# Patient Record
Sex: Female | Born: 1955
Health system: Southern US, Community
[De-identification: ages and names within clinical notes are randomized; demographics above are authoritative.]

## PROBLEM LIST (undated history)

## (undated) DIAGNOSIS — F329 Major depressive disorder, single episode, unspecified: Secondary | ICD-10-CM

## (undated) DIAGNOSIS — F339 Major depressive disorder, recurrent, unspecified: Secondary | ICD-10-CM

## (undated) DIAGNOSIS — E785 Hyperlipidemia, unspecified: Secondary | ICD-10-CM

## (undated) DIAGNOSIS — M545 Low back pain, unspecified: Secondary | ICD-10-CM

## (undated) DIAGNOSIS — K2970 Gastritis, unspecified, without bleeding: Secondary | ICD-10-CM

## (undated) DIAGNOSIS — S82842A Displaced bimalleolar fracture of left lower leg, initial encounter for closed fracture: Secondary | ICD-10-CM

## (undated) DIAGNOSIS — M542 Cervicalgia: Secondary | ICD-10-CM

## (undated) DIAGNOSIS — B9681 Helicobacter pylori [H. pylori] as the cause of diseases classified elsewhere: Secondary | ICD-10-CM

## (undated) DIAGNOSIS — G43909 Migraine, unspecified, not intractable, without status migrainosus: Secondary | ICD-10-CM

## (undated) DIAGNOSIS — G44009 Cluster headache syndrome, unspecified, not intractable: Secondary | ICD-10-CM

## (undated) DIAGNOSIS — I6521 Occlusion and stenosis of right carotid artery: Secondary | ICD-10-CM

## (undated) DIAGNOSIS — R413 Other amnesia: Secondary | ICD-10-CM

## (undated) DIAGNOSIS — M797 Fibromyalgia: Secondary | ICD-10-CM

## (undated) DIAGNOSIS — R29898 Other symptoms and signs involving the musculoskeletal system: Secondary | ICD-10-CM

## (undated) DIAGNOSIS — M810 Age-related osteoporosis without current pathological fracture: Secondary | ICD-10-CM

## (undated) DIAGNOSIS — F32A Depression, unspecified: Secondary | ICD-10-CM

## (undated) DIAGNOSIS — F419 Anxiety disorder, unspecified: Secondary | ICD-10-CM

## (undated) HISTORY — PX: HAND SURGERY: SHX662

## (undated) HISTORY — DX: Major depressive disorder, recurrent, unspecified: F33.9

## (undated) HISTORY — DX: Low back pain: M54.5

## (undated) HISTORY — DX: Other symptoms and signs involving the musculoskeletal system: R29.898

## (undated) HISTORY — DX: Fibromyalgia: M79.7

## (undated) HISTORY — DX: Gastritis, unspecified, without bleeding: K29.70

## (undated) HISTORY — DX: Cluster headache syndrome, unspecified, not intractable: G44.009

## (undated) HISTORY — PX: LEG SURGERY: SHX1003

## (undated) HISTORY — PX: SHOULDER SURGERY: SHX246

## (undated) HISTORY — DX: Depression, unspecified: F32.A

## (undated) HISTORY — PX: NECK SURGERY: SHX720

## (undated) HISTORY — DX: Migraine, unspecified, not intractable, without status migrainosus: G43.909

## (undated) HISTORY — PX: BACK SURGERY: SHX140

## (undated) HISTORY — DX: Cervicalgia: M54.2

## (undated) HISTORY — DX: Low back pain, unspecified: M54.50

## (undated) HISTORY — DX: Hyperlipidemia, unspecified: E78.5

## (undated) HISTORY — DX: Age-related osteoporosis without current pathological fracture: M81.0

## (undated) HISTORY — PX: ABDOMINAL HYSTERECTOMY: SHX81

## (undated) HISTORY — DX: Other amnesia: R41.3

## (undated) HISTORY — DX: Anxiety disorder, unspecified: F41.9

## (undated) HISTORY — DX: Displaced bimalleolar fracture of left lower leg, initial encounter for closed fracture: S82.842A

## (undated) HISTORY — DX: Occlusion and stenosis of right carotid artery: I65.21

## (undated) HISTORY — DX: Major depressive disorder, single episode, unspecified: F32.9

## (undated) HISTORY — DX: Helicobacter pylori (H. pylori) as the cause of diseases classified elsewhere: B96.81

---

## 1998-05-30 ENCOUNTER — Ambulatory Visit (HOSPITAL_BASED_OUTPATIENT_CLINIC_OR_DEPARTMENT_OTHER): Admission: RE | Admit: 1998-05-30 | Discharge: 1998-05-30 | Payer: Self-pay | Admitting: Orthopedic Surgery

## 2000-06-26 ENCOUNTER — Encounter: Payer: Self-pay | Admitting: Emergency Medicine

## 2000-06-26 ENCOUNTER — Emergency Department (HOSPITAL_COMMUNITY): Admission: EM | Admit: 2000-06-26 | Discharge: 2000-06-26 | Payer: Self-pay | Admitting: Emergency Medicine

## 2001-10-17 ENCOUNTER — Emergency Department (HOSPITAL_COMMUNITY): Admission: EM | Admit: 2001-10-17 | Discharge: 2001-10-17 | Payer: Self-pay | Admitting: Emergency Medicine

## 2001-11-16 ENCOUNTER — Ambulatory Visit (HOSPITAL_COMMUNITY): Admission: RE | Admit: 2001-11-16 | Discharge: 2001-11-16 | Payer: Self-pay | Admitting: Internal Medicine

## 2001-11-25 ENCOUNTER — Observation Stay (HOSPITAL_COMMUNITY): Admission: RE | Admit: 2001-11-25 | Discharge: 2001-11-26 | Payer: Self-pay | Admitting: Neurosurgery

## 2002-11-23 ENCOUNTER — Ambulatory Visit (HOSPITAL_BASED_OUTPATIENT_CLINIC_OR_DEPARTMENT_OTHER): Admission: RE | Admit: 2002-11-23 | Discharge: 2002-11-23 | Payer: Self-pay | Admitting: Specialist

## 2003-04-01 ENCOUNTER — Encounter: Payer: Self-pay | Admitting: Emergency Medicine

## 2003-04-01 ENCOUNTER — Emergency Department (HOSPITAL_COMMUNITY): Admission: EM | Admit: 2003-04-01 | Discharge: 2003-04-01 | Payer: Self-pay | Admitting: Emergency Medicine

## 2003-05-05 ENCOUNTER — Encounter: Admission: RE | Admit: 2003-05-05 | Discharge: 2003-05-05 | Payer: Self-pay | Admitting: Internal Medicine

## 2003-05-05 ENCOUNTER — Encounter: Payer: Self-pay | Admitting: Internal Medicine

## 2003-06-20 ENCOUNTER — Other Ambulatory Visit: Admission: RE | Admit: 2003-06-20 | Discharge: 2003-06-20 | Payer: Self-pay | Admitting: Obstetrics & Gynecology

## 2003-07-21 ENCOUNTER — Ambulatory Visit (HOSPITAL_BASED_OUTPATIENT_CLINIC_OR_DEPARTMENT_OTHER): Admission: RE | Admit: 2003-07-21 | Discharge: 2003-07-21 | Payer: Self-pay | Admitting: Orthopedic Surgery

## 2003-09-02 ENCOUNTER — Emergency Department (HOSPITAL_COMMUNITY): Admission: AD | Admit: 2003-09-02 | Discharge: 2003-09-02 | Payer: Self-pay | Admitting: Internal Medicine

## 2003-09-14 ENCOUNTER — Emergency Department (HOSPITAL_COMMUNITY): Admission: AD | Admit: 2003-09-14 | Discharge: 2003-09-14 | Payer: Self-pay | Admitting: Family Medicine

## 2003-09-16 ENCOUNTER — Emergency Department (HOSPITAL_COMMUNITY): Admission: EM | Admit: 2003-09-16 | Discharge: 2003-09-16 | Payer: Self-pay | Admitting: Internal Medicine

## 2004-02-10 ENCOUNTER — Emergency Department (HOSPITAL_COMMUNITY): Admission: EM | Admit: 2004-02-10 | Discharge: 2004-02-10 | Payer: Self-pay | Admitting: Family Medicine

## 2004-02-11 ENCOUNTER — Emergency Department (HOSPITAL_COMMUNITY): Admission: EM | Admit: 2004-02-11 | Discharge: 2004-02-11 | Payer: Self-pay | Admitting: Family Medicine

## 2004-02-25 ENCOUNTER — Emergency Department (HOSPITAL_COMMUNITY): Admission: EM | Admit: 2004-02-25 | Discharge: 2004-02-25 | Payer: Self-pay | Admitting: Family Medicine

## 2004-03-03 ENCOUNTER — Emergency Department (HOSPITAL_COMMUNITY): Admission: EM | Admit: 2004-03-03 | Discharge: 2004-03-03 | Payer: Self-pay | Admitting: *Deleted

## 2004-04-07 ENCOUNTER — Emergency Department (HOSPITAL_COMMUNITY): Admission: EM | Admit: 2004-04-07 | Discharge: 2004-04-07 | Payer: Self-pay | Admitting: Emergency Medicine

## 2004-06-07 ENCOUNTER — Emergency Department (HOSPITAL_COMMUNITY): Admission: EM | Admit: 2004-06-07 | Discharge: 2004-06-07 | Payer: Self-pay | Admitting: Family Medicine

## 2004-06-10 ENCOUNTER — Emergency Department (HOSPITAL_COMMUNITY): Admission: EM | Admit: 2004-06-10 | Discharge: 2004-06-10 | Payer: Self-pay | Admitting: Family Medicine

## 2004-06-11 ENCOUNTER — Emergency Department (HOSPITAL_COMMUNITY): Admission: EM | Admit: 2004-06-11 | Discharge: 2004-06-11 | Payer: Self-pay | Admitting: Family Medicine

## 2004-06-12 ENCOUNTER — Ambulatory Visit: Payer: Self-pay | Admitting: Internal Medicine

## 2004-06-25 ENCOUNTER — Emergency Department (HOSPITAL_COMMUNITY): Admission: EM | Admit: 2004-06-25 | Discharge: 2004-06-25 | Payer: Self-pay | Admitting: Emergency Medicine

## 2004-07-11 ENCOUNTER — Ambulatory Visit: Payer: Self-pay | Admitting: Internal Medicine

## 2004-07-14 ENCOUNTER — Emergency Department (HOSPITAL_COMMUNITY): Admission: EM | Admit: 2004-07-14 | Discharge: 2004-07-14 | Payer: Self-pay | Admitting: Emergency Medicine

## 2004-07-28 ENCOUNTER — Emergency Department (HOSPITAL_COMMUNITY): Admission: EM | Admit: 2004-07-28 | Discharge: 2004-07-28 | Payer: Self-pay | Admitting: Emergency Medicine

## 2004-07-30 ENCOUNTER — Emergency Department (HOSPITAL_COMMUNITY): Admission: EM | Admit: 2004-07-30 | Discharge: 2004-07-30 | Payer: Self-pay | Admitting: Emergency Medicine

## 2004-08-08 ENCOUNTER — Emergency Department (HOSPITAL_COMMUNITY): Admission: EM | Admit: 2004-08-08 | Discharge: 2004-08-08 | Payer: Self-pay | Admitting: Emergency Medicine

## 2004-08-12 ENCOUNTER — Emergency Department (HOSPITAL_COMMUNITY): Admission: EM | Admit: 2004-08-12 | Discharge: 2004-08-12 | Payer: Self-pay | Admitting: Emergency Medicine

## 2004-08-14 ENCOUNTER — Ambulatory Visit: Payer: Self-pay | Admitting: Internal Medicine

## 2004-08-16 ENCOUNTER — Emergency Department (HOSPITAL_COMMUNITY): Admission: EM | Admit: 2004-08-16 | Discharge: 2004-08-16 | Payer: Self-pay | Admitting: Family Medicine

## 2004-08-23 ENCOUNTER — Emergency Department (HOSPITAL_COMMUNITY): Admission: EM | Admit: 2004-08-23 | Discharge: 2004-08-23 | Payer: Self-pay | Admitting: Emergency Medicine

## 2004-08-27 ENCOUNTER — Emergency Department (HOSPITAL_COMMUNITY): Admission: EM | Admit: 2004-08-27 | Discharge: 2004-08-27 | Payer: Self-pay | Admitting: Emergency Medicine

## 2004-09-17 ENCOUNTER — Emergency Department (HOSPITAL_COMMUNITY): Admission: EM | Admit: 2004-09-17 | Discharge: 2004-09-17 | Payer: Self-pay | Admitting: Emergency Medicine

## 2004-09-25 ENCOUNTER — Ambulatory Visit (HOSPITAL_BASED_OUTPATIENT_CLINIC_OR_DEPARTMENT_OTHER): Admission: RE | Admit: 2004-09-25 | Discharge: 2004-09-25 | Payer: Self-pay | Admitting: Orthopedic Surgery

## 2004-10-21 ENCOUNTER — Emergency Department (HOSPITAL_COMMUNITY): Admission: EM | Admit: 2004-10-21 | Discharge: 2004-10-21 | Payer: Self-pay | Admitting: Emergency Medicine

## 2004-10-25 ENCOUNTER — Emergency Department (HOSPITAL_COMMUNITY): Admission: EM | Admit: 2004-10-25 | Discharge: 2004-10-25 | Payer: Self-pay | Admitting: Emergency Medicine

## 2004-10-30 ENCOUNTER — Ambulatory Visit: Payer: Self-pay | Admitting: Internal Medicine

## 2004-11-04 ENCOUNTER — Emergency Department (HOSPITAL_COMMUNITY): Admission: EM | Admit: 2004-11-04 | Discharge: 2004-11-04 | Payer: Self-pay | Admitting: Emergency Medicine

## 2004-11-07 ENCOUNTER — Ambulatory Visit: Payer: Self-pay | Admitting: Internal Medicine

## 2004-11-08 ENCOUNTER — Emergency Department (HOSPITAL_COMMUNITY): Admission: EM | Admit: 2004-11-08 | Discharge: 2004-11-08 | Payer: Self-pay | Admitting: Emergency Medicine

## 2004-11-13 ENCOUNTER — Emergency Department (HOSPITAL_COMMUNITY): Admission: EM | Admit: 2004-11-13 | Discharge: 2004-11-13 | Payer: Self-pay | Admitting: Emergency Medicine

## 2004-11-16 ENCOUNTER — Emergency Department (HOSPITAL_COMMUNITY): Admission: EM | Admit: 2004-11-16 | Discharge: 2004-11-16 | Payer: Self-pay | Admitting: Emergency Medicine

## 2004-11-18 ENCOUNTER — Ambulatory Visit: Payer: Self-pay | Admitting: Internal Medicine

## 2004-11-19 ENCOUNTER — Emergency Department (HOSPITAL_COMMUNITY): Admission: EM | Admit: 2004-11-19 | Discharge: 2004-11-19 | Payer: Self-pay | Admitting: Emergency Medicine

## 2004-11-22 ENCOUNTER — Emergency Department (HOSPITAL_COMMUNITY): Admission: EM | Admit: 2004-11-22 | Discharge: 2004-11-23 | Payer: Self-pay | Admitting: Emergency Medicine

## 2004-11-26 ENCOUNTER — Ambulatory Visit (HOSPITAL_COMMUNITY): Admission: RE | Admit: 2004-11-26 | Discharge: 2004-11-26 | Payer: Self-pay | Admitting: Neurology

## 2004-11-29 ENCOUNTER — Emergency Department (HOSPITAL_COMMUNITY): Admission: EM | Admit: 2004-11-29 | Discharge: 2004-11-30 | Payer: Self-pay | Admitting: Emergency Medicine

## 2004-12-02 ENCOUNTER — Emergency Department (HOSPITAL_COMMUNITY): Admission: EM | Admit: 2004-12-02 | Discharge: 2004-12-02 | Payer: Self-pay | Admitting: Emergency Medicine

## 2004-12-03 ENCOUNTER — Emergency Department (HOSPITAL_COMMUNITY): Admission: EM | Admit: 2004-12-03 | Discharge: 2004-12-03 | Payer: Self-pay | Admitting: Emergency Medicine

## 2004-12-08 ENCOUNTER — Emergency Department (HOSPITAL_COMMUNITY): Admission: EM | Admit: 2004-12-08 | Discharge: 2004-12-08 | Payer: Self-pay | Admitting: Emergency Medicine

## 2004-12-10 ENCOUNTER — Emergency Department (HOSPITAL_COMMUNITY): Admission: EM | Admit: 2004-12-10 | Discharge: 2004-12-10 | Payer: Self-pay

## 2004-12-12 ENCOUNTER — Emergency Department (HOSPITAL_COMMUNITY): Admission: EM | Admit: 2004-12-12 | Discharge: 2004-12-12 | Payer: Self-pay | Admitting: Emergency Medicine

## 2004-12-13 ENCOUNTER — Ambulatory Visit: Payer: Self-pay | Admitting: Internal Medicine

## 2004-12-18 ENCOUNTER — Ambulatory Visit: Payer: Self-pay | Admitting: Internal Medicine

## 2004-12-20 ENCOUNTER — Ambulatory Visit: Payer: Self-pay | Admitting: Internal Medicine

## 2004-12-31 ENCOUNTER — Ambulatory Visit: Payer: Self-pay | Admitting: Internal Medicine

## 2005-01-15 ENCOUNTER — Ambulatory Visit: Payer: Self-pay | Admitting: Internal Medicine

## 2005-01-20 ENCOUNTER — Ambulatory Visit: Payer: Self-pay | Admitting: Internal Medicine

## 2005-01-20 ENCOUNTER — Emergency Department (HOSPITAL_COMMUNITY): Admission: EM | Admit: 2005-01-20 | Discharge: 2005-01-20 | Payer: Self-pay | Admitting: Emergency Medicine

## 2005-01-29 ENCOUNTER — Ambulatory Visit (HOSPITAL_COMMUNITY): Admission: RE | Admit: 2005-01-29 | Discharge: 2005-01-29 | Payer: Self-pay | Admitting: Specialist

## 2005-02-07 ENCOUNTER — Ambulatory Visit: Payer: Self-pay | Admitting: Internal Medicine

## 2005-02-14 ENCOUNTER — Ambulatory Visit: Payer: Self-pay | Admitting: Adult Health

## 2005-03-18 ENCOUNTER — Ambulatory Visit (HOSPITAL_BASED_OUTPATIENT_CLINIC_OR_DEPARTMENT_OTHER): Admission: RE | Admit: 2005-03-18 | Discharge: 2005-03-18 | Payer: Self-pay | Admitting: Specialist

## 2005-03-23 ENCOUNTER — Ambulatory Visit: Payer: Self-pay | Admitting: Internal Medicine

## 2005-04-02 ENCOUNTER — Ambulatory Visit: Payer: Self-pay | Admitting: Internal Medicine

## 2005-04-09 ENCOUNTER — Encounter: Admission: RE | Admit: 2005-04-09 | Discharge: 2005-04-09 | Payer: Self-pay | Admitting: Otolaryngology

## 2005-04-10 ENCOUNTER — Ambulatory Visit (HOSPITAL_COMMUNITY): Admission: RE | Admit: 2005-04-10 | Discharge: 2005-04-10 | Payer: Self-pay | Admitting: Internal Medicine

## 2005-04-16 ENCOUNTER — Ambulatory Visit: Payer: Self-pay | Admitting: Internal Medicine

## 2005-05-20 ENCOUNTER — Ambulatory Visit: Payer: Self-pay | Admitting: Internal Medicine

## 2005-05-30 ENCOUNTER — Ambulatory Visit: Payer: Self-pay | Admitting: Internal Medicine

## 2005-06-10 ENCOUNTER — Ambulatory Visit: Payer: Self-pay | Admitting: Internal Medicine

## 2005-06-11 ENCOUNTER — Ambulatory Visit: Payer: Self-pay | Admitting: Internal Medicine

## 2005-07-01 ENCOUNTER — Ambulatory Visit: Payer: Self-pay | Admitting: Internal Medicine

## 2005-07-31 ENCOUNTER — Ambulatory Visit: Payer: Self-pay | Admitting: Internal Medicine

## 2005-08-18 ENCOUNTER — Ambulatory Visit: Payer: Self-pay | Admitting: Internal Medicine

## 2005-08-25 ENCOUNTER — Ambulatory Visit: Payer: Self-pay | Admitting: Internal Medicine

## 2005-09-18 ENCOUNTER — Ambulatory Visit: Payer: Self-pay | Admitting: Pulmonary Disease

## 2005-11-04 ENCOUNTER — Ambulatory Visit: Payer: Self-pay | Admitting: Internal Medicine

## 2005-11-11 ENCOUNTER — Ambulatory Visit: Payer: Self-pay | Admitting: Internal Medicine

## 2005-12-09 ENCOUNTER — Ambulatory Visit: Payer: Self-pay | Admitting: Internal Medicine

## 2005-12-30 ENCOUNTER — Ambulatory Visit: Payer: Self-pay | Admitting: Internal Medicine

## 2006-01-04 ENCOUNTER — Emergency Department (HOSPITAL_COMMUNITY): Admission: EM | Admit: 2006-01-04 | Discharge: 2006-01-04 | Payer: Self-pay | Admitting: Emergency Medicine

## 2006-01-08 ENCOUNTER — Ambulatory Visit (HOSPITAL_COMMUNITY): Admission: RE | Admit: 2006-01-08 | Discharge: 2006-01-08 | Payer: Self-pay | Admitting: Orthopedic Surgery

## 2006-01-08 ENCOUNTER — Ambulatory Visit: Payer: Self-pay | Admitting: Internal Medicine

## 2006-01-15 ENCOUNTER — Ambulatory Visit (HOSPITAL_COMMUNITY): Admission: RE | Admit: 2006-01-15 | Discharge: 2006-01-15 | Payer: Self-pay | Admitting: Internal Medicine

## 2006-01-27 ENCOUNTER — Ambulatory Visit: Payer: Self-pay | Admitting: Internal Medicine

## 2006-02-24 ENCOUNTER — Ambulatory Visit: Payer: Self-pay | Admitting: Internal Medicine

## 2006-03-30 ENCOUNTER — Ambulatory Visit: Payer: Self-pay | Admitting: Internal Medicine

## 2006-04-20 ENCOUNTER — Ambulatory Visit: Payer: Self-pay | Admitting: Internal Medicine

## 2006-05-07 ENCOUNTER — Ambulatory Visit: Payer: Self-pay | Admitting: Internal Medicine

## 2006-05-18 ENCOUNTER — Ambulatory Visit: Payer: Self-pay | Admitting: Internal Medicine

## 2007-01-05 DIAGNOSIS — Z87898 Personal history of other specified conditions: Secondary | ICD-10-CM

## 2007-06-29 ENCOUNTER — Ambulatory Visit: Payer: Self-pay | Admitting: Family Medicine

## 2007-06-29 ENCOUNTER — Encounter (INDEPENDENT_AMBULATORY_CARE_PROVIDER_SITE_OTHER): Payer: Self-pay | Admitting: Nurse Practitioner

## 2007-06-29 LAB — CONVERTED CEMR LAB
BUN: 14 mg/dL (ref 6–23)
Basophils Absolute: 0 10*3/uL (ref 0.0–0.1)
CO2: 24 meq/L (ref 19–32)
Calcium: 9.7 mg/dL (ref 8.4–10.5)
Chloride: 103 meq/L (ref 96–112)
Creatinine, Ser: 0.96 mg/dL (ref 0.40–1.20)
Eosinophils Relative: 1 % (ref 0–5)
HCT: 42.8 % (ref 36.0–46.0)
Hemoglobin: 14.8 g/dL (ref 12.0–15.0)
Lymphocytes Relative: 30 % (ref 12–46)
MCHC: 34.6 g/dL (ref 30.0–36.0)
Monocytes Absolute: 0.6 10*3/uL (ref 0.1–1.0)
Monocytes Relative: 7 % (ref 3–12)
Neutro Abs: 5.1 10*3/uL (ref 1.7–7.7)
RBC: 4.77 M/uL (ref 3.87–5.11)
RDW: 13 % (ref 11.5–15.5)
TSH: 0.851 microintl units/mL (ref 0.350–5.50)
Total Bilirubin: 0.5 mg/dL (ref 0.3–1.2)

## 2007-06-30 ENCOUNTER — Encounter (INDEPENDENT_AMBULATORY_CARE_PROVIDER_SITE_OTHER): Payer: Self-pay | Admitting: Nurse Practitioner

## 2007-06-30 LAB — CONVERTED CEMR LAB: Hgb A1c MFr Bld: 5.6 % (ref 4.6–6.1)

## 2007-07-20 ENCOUNTER — Encounter (INDEPENDENT_AMBULATORY_CARE_PROVIDER_SITE_OTHER): Payer: Self-pay | Admitting: Nurse Practitioner

## 2007-07-20 ENCOUNTER — Ambulatory Visit: Payer: Self-pay | Admitting: Family Medicine

## 2007-07-20 LAB — CONVERTED CEMR LAB
Cholesterol: 177 mg/dL (ref 0–200)
HDL: 48 mg/dL (ref >39)
LDL Cholesterol: 93 mg/dL (ref 0–99)
Triglycerides: 181 mg/dL — ABNORMAL HIGH (ref <150)

## 2007-11-15 ENCOUNTER — Encounter: Payer: Self-pay | Admitting: Internal Medicine

## 2007-12-08 ENCOUNTER — Encounter
Admission: RE | Admit: 2007-12-08 | Discharge: 2008-03-07 | Payer: Self-pay | Admitting: Physical Medicine and Rehabilitation

## 2008-02-10 ENCOUNTER — Ambulatory Visit: Payer: Self-pay | Admitting: Family Medicine

## 2008-03-31 ENCOUNTER — Encounter: Payer: Self-pay | Admitting: Internal Medicine

## 2008-04-14 ENCOUNTER — Encounter: Payer: Self-pay | Admitting: Internal Medicine

## 2008-08-08 ENCOUNTER — Encounter: Admission: RE | Admit: 2008-08-08 | Discharge: 2008-08-08 | Payer: Self-pay | Admitting: Specialist

## 2008-08-14 ENCOUNTER — Ambulatory Visit (HOSPITAL_BASED_OUTPATIENT_CLINIC_OR_DEPARTMENT_OTHER): Admission: RE | Admit: 2008-08-14 | Discharge: 2008-08-14 | Payer: Self-pay | Admitting: Specialist

## 2009-08-06 ENCOUNTER — Encounter: Payer: Self-pay | Admitting: Internal Medicine

## 2009-09-21 ENCOUNTER — Encounter: Payer: Self-pay | Admitting: Internal Medicine

## 2009-12-18 ENCOUNTER — Ambulatory Visit (HOSPITAL_BASED_OUTPATIENT_CLINIC_OR_DEPARTMENT_OTHER)
Admission: RE | Admit: 2009-12-18 | Discharge: 2009-12-18 | Payer: Self-pay | Admitting: Physical Medicine and Rehabilitation

## 2010-02-18 ENCOUNTER — Encounter: Payer: Self-pay | Admitting: Internal Medicine

## 2010-02-26 ENCOUNTER — Encounter: Payer: Self-pay | Admitting: Internal Medicine

## 2010-03-25 ENCOUNTER — Encounter: Payer: Self-pay | Admitting: Internal Medicine

## 2010-04-22 ENCOUNTER — Ambulatory Visit: Payer: Self-pay | Admitting: Internal Medicine

## 2010-04-22 DIAGNOSIS — K625 Hemorrhage of anus and rectum: Secondary | ICD-10-CM | POA: Insufficient documentation

## 2010-04-22 DIAGNOSIS — R1031 Right lower quadrant pain: Secondary | ICD-10-CM | POA: Insufficient documentation

## 2010-04-22 DIAGNOSIS — R198 Other specified symptoms and signs involving the digestive system and abdomen: Secondary | ICD-10-CM | POA: Insufficient documentation

## 2010-04-22 DIAGNOSIS — R112 Nausea with vomiting, unspecified: Secondary | ICD-10-CM | POA: Insufficient documentation

## 2010-04-22 LAB — CONVERTED CEMR LAB
ALT: 15 units/L (ref 0–35)
Alkaline Phosphatase: 49 units/L (ref 39–117)
Basophils Absolute: 0 10*3/uL (ref 0.0–0.1)
CO2: 34 meq/L — ABNORMAL HIGH (ref 19–32)
Creatinine, Ser: 1.1 mg/dL (ref 0.4–1.2)
GFR calc non Af Amer: 56.01 mL/min (ref >60)
HCT: 37.7 % (ref 36.0–46.0)
Hemoglobin: 12.9 g/dL (ref 12.0–15.0)
Lymphs Abs: 2.8 10*3/uL (ref 0.7–4.0)
MCHC: 34.3 g/dL (ref 30.0–36.0)
MCV: 93.2 fL (ref 78.0–100.0)
Monocytes Absolute: 0.5 10*3/uL (ref 0.1–1.0)
Monocytes Relative: 8.2 % (ref 3.0–12.0)
Neutro Abs: 2.9 10*3/uL (ref 1.4–7.7)
Platelets: 183 10*3/uL (ref 150.0–400.0)
RDW: 12.8 % (ref 11.5–14.6)
TSH: 0.9 microintl units/mL (ref 0.35–5.50)
Total Bilirubin: 0.4 mg/dL (ref 0.3–1.2)

## 2010-04-24 ENCOUNTER — Telehealth: Payer: Self-pay | Admitting: Internal Medicine

## 2010-05-02 ENCOUNTER — Ambulatory Visit: Payer: Self-pay | Admitting: Cardiology

## 2010-05-06 ENCOUNTER — Encounter (INDEPENDENT_AMBULATORY_CARE_PROVIDER_SITE_OTHER): Payer: Self-pay | Admitting: *Deleted

## 2010-05-08 ENCOUNTER — Ambulatory Visit: Payer: Self-pay | Admitting: Internal Medicine

## 2010-05-15 ENCOUNTER — Ambulatory Visit: Payer: Self-pay | Admitting: Internal Medicine

## 2010-07-23 ENCOUNTER — Telehealth: Payer: Self-pay | Admitting: Internal Medicine

## 2010-07-25 ENCOUNTER — Telehealth (INDEPENDENT_AMBULATORY_CARE_PROVIDER_SITE_OTHER): Payer: Self-pay

## 2010-07-25 DIAGNOSIS — J984 Other disorders of lung: Secondary | ICD-10-CM

## 2010-08-06 ENCOUNTER — Ambulatory Visit: Payer: Self-pay | Admitting: Cardiology

## 2010-08-06 ENCOUNTER — Telehealth: Payer: Self-pay | Admitting: Internal Medicine

## 2010-08-23 ENCOUNTER — Encounter
Admission: RE | Admit: 2010-08-23 | Discharge: 2010-08-23 | Payer: Self-pay | Source: Home / Self Care | Attending: Physical Medicine and Rehabilitation | Admitting: Physical Medicine and Rehabilitation

## 2010-09-01 ENCOUNTER — Encounter: Payer: Self-pay | Admitting: Internal Medicine

## 2010-09-01 ENCOUNTER — Encounter: Payer: Self-pay | Admitting: Family Medicine

## 2010-09-10 NOTE — Letter (Signed)
Summary: [facility]   Imported By: Lester Delavan 04/25/2010 07:49:42  _____________________________________________________________________  External Attachment:    Type:   Image     Comment:   External Document

## 2010-09-10 NOTE — Letter (Signed)
Summary: [facility]  [facility]   Imported By: Lester Lansford 04/25/2010 07:48:16  _____________________________________________________________________  External Attachment:    Type:   Image     Comment:   External Document

## 2010-09-10 NOTE — Miscellaneous (Signed)
Summary: [facility] Previsit/prep  ...  Clinical Lists Changes  Medications: Added new medication of MOVIPREP 100 GM  SOLR (PEG-KCL-NACL-NASULF-NA ASC-C) As per prep instructions. - Signed Rx of MOVIPREP 100 GM  SOLR (PEG-KCL-NACL-NASULF-NA ASC-C) As per prep instructions.;  #1 x 0;  Signed;  Entered by: Wyona Almas RN;  Authorized by: Iva Boop MD, [facility];  Method used: Electronically to [facility] [phone]*, [address] [place], [place]  [address], Ph: [phone], Fax: [phone] Allergies: Changed allergy or adverse reaction from PENICILLIN to PENICILLIN Observations: Added new observation of ALLERGY REV: Done (05/08/2010 8:28)    Prescriptions: MOVIPREP 100 GM  SOLR (PEG-KCL-NACL-NASULF-NA ASC-C) As per prep instructions.  #1 x 0   Entered by:   Wyona Almas RN   Authorized by:   Iva Boop MD, [facility]   Signed by:   Wyona Almas RN on 05/08/2010   Method used:   Electronically to        [facility] [phone]* (retail)       [address]       [place], [place]  [address]       Ph: [phone]       Fax: [phone]   RxID:   [id number]

## 2010-09-10 NOTE — Progress Notes (Signed)
Summary: CT Scheduling  Phone Note Outgoing Call   Call placed by: Francee Piccolo CMA Duncan Dull),  April 24, 2010 5:05 PM Summary of Call: Notified pt that we have not heard from [facility] regarding prior auth for CT Scan.  Pt is agreeable with plan to cancel tomorrow's appt and reschedule once we have heard from [facility].  Charity at CT Scheduling notified to cancel pt's appt for tomorrow.  Follow-up for Phone Call        Per [place], [employer] has given PA # ([phone]).  Pt is notified that we can scheudle.  Pt is rescheduled for 05/02/10 @ 10:30am.  Pt is aware.  Instructions reviewed with pt. Follow-up by: Francee Piccolo CMA Duncan Dull),  April 30, 2010 4:31 PM

## 2010-09-10 NOTE — Letter (Signed)
Summary: New Patient letter  [facility]  [address] [place], [place] [address]   Phone: [phone]  Fax: [phone]       02/26/2010 MRN: [id number]  Julie Neal [address] [place], [place]  [address]  Dear Ms. Schuitema,  Welcome to the Gastroenterology Division at [facility].    You are scheduled to see Dr.  Leone Payor on 04-22-10 at 1:45pm on the 3rd floor at [facility], [address]  We ask that you try to arrive at our office 15 minutes prior to your appointment time to allow for check-in.  We would like you to complete the enclosed self-administered evaluation form prior to your visit and bring it with you on the day of your appointment.  We will review it with you.  Also, please bring a complete list of all your medications or, if you prefer, bring the medication bottles and we will list them.  Please bring your insurance card so that we may make a copy of it.  If your insurance requires a referral to see a specialist, please bring your referral form from your primary care physician.  Co-payments are due at the time of your visit and may be paid by cash, check or credit card.     Your office visit will consist of a consult with your physician (includes a physical exam), any laboratory testing he/she may order, scheduling of any necessary diagnostic testing (e.g. x-ray, ultrasound, CT-scan), and scheduling of a procedure (e.g. Endoscopy, Colonoscopy) if required.  Please allow enough time on your schedule to allow for any/all of these possibilities.    If you cannot keep your appointment, please call [phone] to cancel or reschedule prior to your appointment date.  This allows [place] the opportunity to schedule an appointment for another patient in need of care.  If you do not cancel or reschedule by 5 p.m. the business day prior to your appointment date, you will be charged a $50.00 late cancellation/no-show fee.    Thank you for choosing   Gastroenterology for your medical needs.  We appreciate the opportunity to care for you.  Please visit [place] at our website  to learn more about our practice.                     Sincerely,                                                             The Gastroenterology Division

## 2010-09-10 NOTE — Letter (Signed)
Summary: [facility] Instructions  [facility]  [address] [place], [place] [address]   Phone: [phone]  Fax: [phone]       MERILEE WIBLE    [birth year]    MRN: [id number]        Procedure Day Dorna Bloom: Wednesday 05-15-10     Arrival Time: 9:00 a.m.     Procedure Time: 10:00 a.m.     Location of Procedure:                    _x _  [facility] (4th Floor)                        PREPARATION FOR COLONOSCOPY WITH MOVIPREP   Starting 5 days prior to your procedure  05-10-10 do not eat nuts, seeds, popcorn, corn, beans, peas,  salads, or any raw vegetables.  Do not take any fiber supplements (e.g. Metamucil, Citrucel, and Benefiber).  THE DAY BEFORE YOUR PROCEDURE         DATE:  05-14-10  DAY:  Tuesday  1.  Drink clear liquids the entire day-NO SOLID FOOD  2.  Do not drink anything colored red or purple.  Avoid juices with pulp.  No orange juice.  3.  Drink at least 64 oz. (8 glasses) of fluid/clear liquids during the day to prevent dehydration and help the prep work efficiently.  CLEAR LIQUIDS INCLUDE: Water Jello Ice Popsicles Tea (sugar ok, no milk/cream) Powdered fruit flavored drinks Coffee (sugar ok, no milk/cream) Gatorade Juice: apple, white grape, white cranberry  Lemonade Clear bullion, consomm, broth Carbonated beverages (any kind) Strained chicken noodle soup Hard Candy                             4.  In the morning, mix first dose of MoviPrep solution:    Empty 1 Pouch A and 1 Pouch B into the disposable container    Add lukewarm drinking water to the top line of the container. Mix to dissolve    Refrigerate (mixed solution should be used within 24 hrs)  5.  Begin drinking the prep at 5:00 p.m. The MoviPrep container is divided by 4 marks.   Every 15 minutes drink the solution down to the next mark (approximately 8 oz) until the full liter is complete.   6.  Follow completed prep with 16 oz of clear liquid of your choice  (Nothing red or purple).  Continue to drink clear liquids until bedtime.  7.  Before going to bed, mix second dose of MoviPrep solution:    Empty 1 Pouch A and 1 Pouch B into the disposable container    Add lukewarm drinking water to the top line of the container. Mix to dissolve    Refrigerate  THE DAY OF YOUR PROCEDURE      DATE:  05-15-10  DAY: Wednesday  Beginning at  5:00 a.m. (5 hours before procedure):         1. Every 15 minutes, drink the solution down to the next mark (approx 8 oz) until the full liter is complete.  2. Follow completed prep with 16 oz. of clear liquid of your choice.    3. You may drink clear liquids until  8:00 a.m. (2 HOURS BEFORE PROCEDURE).   MEDICATION INSTRUCTIONS  Unless otherwise instructed, you should take regular prescription medications with a small sip of water  as early as possible the morning of your procedure.        OTHER INSTRUCTIONS  You will need a responsible adult at least 56 years of age to accompany you and drive you home.   This person must remain in the waiting room during your procedure.  Wear loose fitting clothing that is easily removed.  Leave jewelry and other valuables at home.  However, you may wish to bring a book to read or  an iPod/MP3 player to listen to music as you wait for your procedure to start.  Remove all body piercing jewelry and leave at home.  Total time from sign-in until discharge is approximately 2-3 hours.  You should go home directly after your procedure and rest.  You can resume normal activities the  day after your procedure.  The day of your procedure you should not:   Drive   Make legal decisions   Operate machinery   Drink alcohol   Return to work  You will receive specific instructions about eating, activities and medications before you leave.    The above instructions have been reviewed and explained to me by   Wyona Almas RN  May 08, 2010 8:56 AM     I  fully understand and can verbalize these instructions _____________________________ Date _________

## 2010-09-10 NOTE — Procedures (Signed)
Summary: Colonoscopy  Patient: [name] Note: All result statuses are Final unless otherwise noted.  Tests: (1) Colonoscopy (COL)   COL Colonoscopy           DONE     [facility]     [address]     [place], [place]  [address]           COLONOSCOPY PROCEDURE REPORT           PATIENT:  Julie, Neal  MR#:  [id number]     BIRTHDATE:  06/24/1956, 53 yrs. old  GENDER:  female     ENDOSCOPIST:  Iva Boop, MD, [facility]     REF. BY:  Lynnea Ferrier, M.D.     PROCEDURE DATE:  05/15/2010     PROCEDURE:  Colonoscopy [address]     ASA CLASS:  Class I     INDICATIONS:  change in bowel habits, rectal bleeding     MEDICATIONS:   Fentanyl 75 mcg IV, Versed 10 mg IV           DESCRIPTION OF PROCEDURE:   After the risks benefits and     alternatives of the procedure were thoroughly explained, informed     consent was obtained.  Digital rectal exam was performed and     revealed no abnormalities.   The LB CF-[id number] endoscope     was introduced through the anus and advanced to the terminal ileum     which was intubated for a short distance, without limitations.     The quality of the prep was excellent, using MoviPrep.  The     instrument was then slowly withdrawn as the colon was fully     examined. Insertion: 3:35 minutes Withdrawal: 10:10 minutes     <<PROCEDUREIMAGES>>           FINDINGS:  The terminal ileum appeared normal.  A normal appearing     cecum, ileocecal valve, and appendiceal orifice were identified.     The ascending, hepatic flexure, transverse, splenic flexure,     descending, sigmoid colon, and rectum appeared unremarkable. This     includes right colon retroflexion.   Retroflexed views in the     rectum revealed no abnormalities.    The scope was then withdrawn     from the patient and the procedure completed.           COMPLICATIONS:  None     ENDOSCOPIC IMPRESSION:     1) Normal terminal ileum     2) Normal colon     RECOMMENDATIONS:     Take  Miralax daily (or less) but enough to control constipation.     May take 2 doses daily if that is what is required. If that fails     to rlieve pain then can add an antipsasmodic medication.     Follow-up with Dr. Leone Payor as needed for this problem.           REPEAT EXAM:  In 10 year(s) for routine screening colonoscopy.           Iva Boop, MD, [facility]           CC:  Lynnea Ferrier, MD     The Patient           n.     eSIGNED:   Iva Boop at 05/15/2010 11:39 AM           Corky Sing, [phone]  Note:  An exclamation mark (!) indicates a result that was not dispersed into the flowsheet. Document Creation Date: 05/15/2010 11:39 AM _______________________________________________________________________  (1) Order result status: Final Collection or observation date-time: 05/15/2010 11:28 Requested date-time:  Receipt date-time:  Reported date-time:  Referring Physician:   Ordering Physician: Stan Head [phone]) Specimen Source:  Source: Launa Grill Order Number: [id number] Lab site:   Appended Document: Colonoscopy    Clinical Lists Changes  Observations: Added new observation of COLONNXTDUE: 05/2020 (05/15/2010 12:02)

## 2010-09-10 NOTE — Assessment & Plan Note (Signed)
Summary: RECALL COLON-RECTAL BLEED/YF   History of Present Illness Visit Type: consult Primary GI MD: Stan Head MD [facility] Primary Provider: Gilmore Laroche, MD Requesting Provider: Gilmore Laroche, MD Chief Complaint: change in bowels and rectal bleeding History of Present Illness:   55 yo ww with chronic constipation. For past few months increasing constipation and RLQ pain. She had been using MiraLax with success but was told to use fiber instead, by a Careers adviser. She descrbes a sense that she is blocked in RLQ. She vomits sometimes and there can be severe pain with defecation (in RLQ). Also having rectal bleeding with bright red blood when she wipes frequently. Dr. Caren Macadam team found hemorrhoids on exam.   GI Review of Systems    Reports abdominal pain, bloating, nausea, and  vomiting.      Denies acid reflux, belching, chest pain, dysphagia with liquids, dysphagia with solids, heartburn, loss of appetite, vomiting blood, weight loss, and  weight gain.      Reports rectal bleeding.     Denies anal fissure, black tarry stools, change in bowel habit, constipation, diarrhea, diverticulosis, fecal incontinence, heme positive stool, hemorrhoids, irritable bowel syndrome, jaundice, light color stool, liver problems, and  rectal pain. Colonoscopy  Procedure date:  01/10/2003  Findings:      External hemorrhoids, otherwise normal.  Dr. Leone Payor   Preventive Screening-Counseling & Management  Alcohol-Tobacco     Smoking Status: quit      Drug Use:  no.      Current Medications (verified): 1)  Zanaflex 4 Mg Tabs (Tizanidine Hcl) .... Take 1 Tablet By Mouth Four Times A Day 2)  Diazepam 5 Mg Tabs (Diazepam) .... Take 1 Tablet By Mouth Two Times A Day 3)  Duragesic-75 75 Mcg/hr Pt72 (Fentanyl) .... Apply As Directed 4)  Tramadol Hcl 50 Mg Tabs (Tramadol Hcl) .... As Needed  Allergies (verified): 1)  ! Penicillin  Past History:  Past Medical History: Hemorrhoids Chronic  Headaches Anxiety Disorder Arthritis Depression Fibromyalgia Hyperlipidemia Small Bowel Obstruction  Past Surgical History: Carpal Tunnel Release Hysterectomy Tarsal Tunnel Plantar Fascitis Shoulder surgery Back Surgery Neck Surgery  Family History: No FH of Colon Cancer: Family History of Colon Polyps: Brother  Social History: Married, 1 boy, 1 girl Disabled Patient is a former smoker.  Alcohol Use - no Illicit Drug Use - no Smoking Status:  quit Drug Use:  no  Review of Systems       The patient complains of muscle pains/cramps.         All other ROS negative except as per HPI.   Vital Signs:  Patient profile:   55 year old female Height:      67 inches Weight:      181 pounds BMI:     28.45 Pulse rate:   76 / minute Pulse rhythm:   regular BP sitting:   120 / 74  (left arm) Cuff size:   regular  Vitals Entered By: Francee Piccolo CMA Duncan Dull) (April 22, 2010 1:51 PM)  Physical Exam  General:  Well developed, well nourished, no acute distress. Eyes:  PERRLA, no icterus. Mouth:  No deformity or lesions, dentition normal. Neck:  Supple; no masses or thyromegaly. Lungs:  Clear throughout to auscultation. Heart:  Regular rate and rhythm; no murmurs, rubs,  or bruits. Abdomen:  BS+ low transverse scar soft with fullness and tenderness in RLQ she vomited brown material after palpation of abdomen no bruits no herniae, HSM or mass Extremities:  No clubbing, cyanosis,  edema or deformities noted. Neurologic:  Alert and  oriented x3 Cervical Nodes:  No significant cervical or supraclavicular adenopathy.  Psych:  Alert and cooperative. Normal mood and affect.  [facility] notes and 2010/2011 labs reviewed and scanned  Impression & Recommendations:  Problem # 1:  CHANGE IN BOWELS (ICD-787.99) Given tenderness on exam would have her do CT first. Colonoscopy likely needed unless CT shows something requiring other intervention or  evaluation first. Orders: CT Abdomen/Pelvis with Contrast (CT Abd/Pelvis w/con) TLB-CBC Platelet - w/Differential (85025-CBCD) TLB-CMP (Comprehensive Metabolic Pnl) (80053-COMP) TLB-TSH (Thyroid Stimulating Hormone) (84443-TSH)  Problem # 2:  RLQ PAIN (ICD-789.03) CT first labs also then colonoscopy depending upon CT findings  Orders: CT Abdomen/Pelvis with Contrast (CT Abd/Pelvis w/con)  Problem # 3:  NAUSEA AND VOMITING (ICD-787.01) Assessment: New In general has not been frequent. palpation of abdomen created pain and then vomiting today. short-lived and she recovered from that in exam room. She has Phenergan as needed  Problem # 4:  RECTAL BLEEDING (ICD-569.3) Assessment: New probably hemorrhoidal await diagnostic evaluation as mentioned above.  Patient Instructions: 1)  Please go to the basement to have your lab tests drawn today.  2)  Your CT is scheduled at [facility] CT on 04/25/10. 3)  We will call you with further follow up after reviewing these results.  4)  Please start Miralax once daily. 5)  It is OK to take your phenergan as needed. 6)  Copy sent to : Gilmore Laroche, MD 7)  The medication list was reviewed and reconciled.  All changed / newly prescribed medications were explained.  A complete medication list was provided to the patient / caregiver.  Appended Document: RECALL COLON-RECTAL BLEED/YF vomits

## 2010-09-12 NOTE — Progress Notes (Signed)
Summary: CT chest ok   Phone Note Outgoing Call   Summary of Call: Let her know CT is clear and no follow-up needed and fax this and scan report to PCP Iva Boop MD, [facility]  August 06, 2010 10:26 AM   Follow-up for Phone Call        Patient aware,  records faxed to Dr Tanya Nones in [facility]. Follow-up by: Darcey Nora RN, CGRN,  August 06, 2010 10:40 AM

## 2010-09-12 NOTE — Progress Notes (Signed)
Summary: Schedule CT chest   Phone Note Outgoing Call Call back at [facility] Phone [phone]   Call placed by: Darcey Nora RN, CGRN,  July 25, 2010 11:39 AM Call placed to: Patient Summary of Call: Patient is scheduled for CT scan of chest for 08/06/10 10:00.  I will mail her instructions.  She needs to be NPO for 2 hours prior.  She is aware of appointment date and time Initial call taken by: Darcey Nora RN, CGRN,  July 25, 2010 11:41 AM  Follow-up for Phone Call        Does not need BUN and Creatnine due to age. Follow-up by: Darcey Nora RN, CGRN,  July 25, 2010 11:42 AM  New Problems: PULMONARY NODULE (ICD-518.89)   New Problems: PULMONARY NODULE (ICD-518.89)

## 2010-09-12 NOTE — Progress Notes (Signed)
Summary: needs chest CT   Phone Note Outgoing Call   Summary of Call: needs BUN and creat and chest CT with contrast to evaluate and follow-up lung nodule seen on abd CT in sept (see chart). Please call her and schedule this for after Christmas (when I return). Iva Boop MD, [facility]  July 23, 2010 9:09 PM    Follow-up for Phone Call        I spoke to pt and she is unable to schedule appt at this time due to back injury.  She is not feeling well enough to remember instructions or dates. Pt requests that we call her back next week to schedule. Follow-up by: Francee Piccolo CMA Duncan Dull),  July 24, 2010 10:33 AM  Additional Follow-up for Phone Call Additional follow up Details #1::        ok, I will forward on, then Additional Follow-up by: Iva Boop MD, [facility],  July 24, 2010 1:02 PM

## 2010-11-25 ENCOUNTER — Ambulatory Visit (HOSPITAL_BASED_OUTPATIENT_CLINIC_OR_DEPARTMENT_OTHER)
Admission: RE | Admit: 2010-11-25 | Discharge: 2010-11-25 | Disposition: A | Discharge status: Home / Self Care | Payer: 59 | Source: Ambulatory Visit | Attending: Physical Medicine and Rehabilitation | Admitting: Physical Medicine and Rehabilitation

## 2010-11-25 DIAGNOSIS — Z538 Procedure and treatment not carried out for other reasons: Secondary | ICD-10-CM | POA: Insufficient documentation

## 2010-11-25 DIAGNOSIS — M961 Postlaminectomy syndrome, not elsewhere classified: Secondary | ICD-10-CM | POA: Insufficient documentation

## 2010-11-25 LAB — GLUCOSE, CAPILLARY: Glucose-Capillary: 89 mg/dL (ref 70–99)

## 2010-12-23 ENCOUNTER — Ambulatory Visit (HOSPITAL_BASED_OUTPATIENT_CLINIC_OR_DEPARTMENT_OTHER)
Admission: RE | Admit: 2010-12-23 | Payer: 59 | Source: Ambulatory Visit | Admitting: Physical Medicine and Rehabilitation

## 2010-12-24 NOTE — Op Note (Signed)
NAME:  Julie Neal, Julie Neal                ACCOUNT NO.:  [id number]   MEDICAL RECORD NO.:  [id number]          PATIENT TYPE:  AMB   LOCATION:  [facility]                         FACILITY:  [facility]   PHYSICIAN:  Jene Every, M.D.    DATE OF BIRTH:  1956/03/24   DATE OF PROCEDURE:  DATE OF DISCHARGE:                               OPERATIVE REPORT   PREOPERATIVE DIAGNOSIS:  Impingement syndrome, acromioclavicular  arthrosis, adhesive capsulitis.   POSTOPERATIVE DIAGNOSIS:  Impingement syndrome, acromioclavicular  arthrosis, adhesive capsulitis.   PROCEDURE PERFORMED:  1. Left shoulder examination under anesthesia, manipulation under      anesthesia.  2. Left shoulder arthroscopy and debridement of subacromial space.  3. Open distal clavicle resection.  4. Debridement of rotator cuff.   ANESTHESIA:  General.   ASSISTANT:  Roma Schanz, P.A.   BRIEF HISTORY/INDICATINOS:  This is a 55 year old with refractory  shoulder pain, history of a cervical fusion, arthroscopic decompression,  and distal clavicle resection.  She was having recurrent impingement,  pain, diminished range of motion, pain over the [facility] joint, indicated for  arthroscopic evaluation, possible rotator cuff repair with debridement  on the [facility] joint.  After she had failed conservative treatment, risks and  benefits were discussed, including bleeding, infection, suboptimal range  of motion, recurrent pain, infection, etc.   TECHNIQUE:  Patient in supine beach-chair position, after induction of  adequate anesthesia and 1 gm Kefzol, the left shoulder and upper  extremity were prepped and draped in the usual sterile fashion.  Prior  to that, we examined her under anesthesia.  She lacked about 10 degrees  of full forward flexion and abduction, which was restored with gentle  manipulation.  She has good internal and external rotation.  Following  this and prepping and draping of the left upper extremity, demarcating  the AC acromion  with a surgical marker, we made an incision through the  posterolateral skin for posterolateral portal in the arm in the 70-30  position.  Directed the camera in the glenohumeral space, penetrated the  capsule atraumatically.  Examination revealed essentially normal biceps  tendon, rotator cuff.  No significant degenerative changes.  Mild  fraying of the labrum.  I redirected in the subacromial space and made  an incision for a lateral portal, exuberant synovitis and bursitis was  noted.  We inserted a shaver and performed a bursectomy.  I was unable  to deliver the distal clavicle through the subacromial space.  Full  inspection of the rotator cuff anteriorly, laterally, and posteriorly  revealed no evidence of the significant tearing requiring open repair.   After a full bursectomy, examined the under-surface of the acromion.  There was no significant spurring requiring acromioplasty.  After  copious lavage, I removed the arthroscopic equipment.  I made a small  incision over a previous incision over the [facility] joint through the skin,  transverse through the capsule.  I debrided the capsule in the distal  clavicle joint with a Beyer rongeur.  A small spur of the inferior  aspect of the acromion that was removed with a 3-mm Kerrison.  Clavicle  was undercut with this anteriorly and inferiorly.  The joint was  debrided.  Following this, there was good space between the distal  clavicle and the acromion.  It was injected with Marcaine with  epinephrine.  The capsule with 2-0 Vicryl with simple sutures.  The  subcu with 2-0 Vicryl and simple sutures and the skin with 4-  0 subcuticular Prolene.  The portals are closed with 4-0 nylon simple  sutures.  Dressing was applied.  Placed in a sling.  Extubated without  difficulty.  Transferred to the recovery room in satisfactory condition.  Patient tolerated the procedure well with no complication.      Jene Every, M.D.  Electronically  Signed     JB/MEDQ  D:  08/14/2008  T:  08/14/2008  Job:  [id number]

## 2010-12-27 NOTE — Op Note (Signed)
NAME:  Julie Neal, Julie Neal                ACCOUNT NO.:  [id number]   MEDICAL RECORD NO.:  [id number]          PATIENT TYPE:  AMB   LOCATION:  DAY                          FACILITY:  [facility]   PHYSICIAN:  Jene Every, M.D.    DATE OF BIRTH:  1955-08-21   DATE OF PROCEDURE:  01/29/2005  DATE OF DISCHARGE:                                 OPERATIVE REPORT   PREOPERATIVE DIAGNOSIS:  Impingement syndrome, adhesive capsulitis of the  left shoulder.   POSTOPERATIVE DIAGNOSIS:  Impingement syndrome, adhesive capsulitis of the  left shoulder, acromioclavicular synovitis, partial tear of the rotator  cuff.   PROCEDURE PERFORMED:  Left shoulder examination under anesthesia, left  shoulder manipulation under anesthesia, left shoulder arthroscopy,  subacromial decompression, acromioplasty, debridement of rotator cuff and  debridement of acromioclavicular joint.   ANESTHESIA:  General.   ASSISTANT:  Roma Schanz, P.A.   INDICATIONS:  A 55 year old with a history of distal clavicle resection and  impingement syndrome.  Patient recently had an ankle fracture and was  nonweightbearing, utilizing crutches.  Loaded glenohumeral joint and  acromion.  The patient had a recurrent acromial spur, generating severe  subacromial bursitis with adhesive capsulitis, refractory to treatment,  steroid injection, rest, and stretch and exercise program.  Operative  intervention was indicated for evaluation of rotator cuff, subacromial  decompression, bursectomy, evaluation of the AC joint.  MRI indicating no  evidence of a recurrent tear.  The risks and benefits were discussed,  including bleeding, infection, no changes in symptoms, worsening symptoms,  recurring adhesive capsulitis, etc.   TECHNIQUE:  With the patient in the supine position, after satisfactory  general anesthesia and 1 gm of Kefzol, the left shoulder was examined.  The  patient actually had good internal and external rotation.  She lacks  some  forward flexion proximally, 15 degrees of gently stretching to full flexion.  She had good abduction following this.  Next, we used a marker to delineate  the acromion.  The left shoulder and upper extremities were prepped and  draped in the usual sterile fashion.  I placed a portal in the posterior  region in the usual fashion.  I injected with Marcaine and epinephrine in  the subacromial joint.  A lateral incision was made through a previous  surgical incision.  I first entered the scope, placed the scope in the  glenohumeral joint.  I penetrated atraumatically at the 70/30 position.  The  C-arm was placed.  Examination of the rotator cuff from underneath, there  was no evidence of full-thickness tear.  The glenoid showed some  degenerative changes.  No evidence of a tear or significant chondral injury.  The joint was lavaged and then redirected the scope into the subacromial  space.  A lateral incision was made.  I placed the portal into the  anterolateral portion of the acromion and examined.  Hypertrophic bursa was  noted.  This was incised with a shaver.  We used an ArthroWand to  skeletonize the anterior aspect of the acromion.  There was a spur along the  anteromedial aspect of the acromion  to the [facility] joint.  There was hypertrophic  synovitis and scar tissue within the [facility] joint, extending inferiorly,  impending upon the rotator cuff.  After dissection was performed, we  debrided the [facility] joint and then inserted a bur and performed an acromioplasty  and removed the anteromedial spur.  Evaluated the rotator cuff.  There was  superficial tearing of the rotator cuff.  This was debrided to good bleeding  tissue.  We inspected it anteriorly and posteriorly before the bursectomy  was performed.  No residual impingement upon the rotator cuff was noted.  The clavicle was stable.  They shaved the under-surface of the clavicle as  well.  There was a small spur there noted as well.  Next,  the wound was  copiously lavaged.  All instrumentation was removed.  The  portal was closed with 4-0 nylon simple sutures and 0.25% Marcaine with  epinephrine was infiltrated into the joint.  The wound was dressed  sterilely.  She was awakened without difficulty after placement of the sling  and transported to the recovery room in satisfactory condition.  Patient  tolerated the procedure well.  There were no complications.       JB/MEDQ  D:  01/29/2005  T:  01/29/2005  Job:  [id number]

## 2010-12-27 NOTE — Op Note (Signed)
NAME:  Julie Neal, WESTERGARD                ACCOUNT NO.:  [id number]   MEDICAL RECORD NO.:  [id number]          PATIENT TYPE:  AMB   LOCATION:  ENDO                         FACILITY:  [facility]   PHYSICIAN:  Wilhemina Bonito. Marina Goodell, M.D. [facility] OF BIRTH:  [birth year]   DATE OF PROCEDURE:  04/16/2005  DATE OF DISCHARGE:  04/10/2005                                 OPERATIVE REPORT   PROCEDURE PERFORMED:  Esophageal manometry.   REFERRING PHYSICIAN:  Lucky Cowboy, MD.   HISTORY:  This is a 55 year old female with a reported history of dysphagia.  She has apparently had barium esophagram as well as laryngoscopy.  She is  now for esophageal manometry.   DESCRIPTION OF PROCEDURE:  The patient presented to the [place] GI laboratory, April 10, 2005.  Esophageal manometry was  carried out in standard manner.  Data was adequate for analysis and the  patient tolerated the procedure well.   FINDINGS:  1.  The upper esophageal sphincter demonstrated normal relaxation and      coordination.  2.  The esophageal body demonstrated normal wave amplitude and normal wave      propagation.  Peristalsis was present in 100% of recorded contractions.  3.  Lower esophageal sphincter resting pressure was normal at 33.8 mmHg.  In      addition, normal and complete relaxation with wet swallowing was      demonstrated.   IMPRESSION:  Normal esophageal manometry.   PLAN:  Per Lucky Cowboy, MD           ______________________________  Wilhemina Bonito. Marina Goodell, M.D. [facility]     JNP/MEDQ  D:  04/16/2005  T:  04/17/2005  Job:  [id number]   cc:   Lucky Cowboy, MD  [phone] [place]  [place] [address]  Fax: [phone]

## 2010-12-27 NOTE — Op Note (Signed)
NAME:  Julie Neal, Julie Neal                          ACCOUNT NO.:  [id number]   MEDICAL RECORD NO.:  [id number]                   PATIENT TYPE:  AMB   LOCATION:  DSC                                  FACILITY:  [facility]   PHYSICIAN:  Dionne Ano. Everlene Other, M.D.         DATE OF BIRTH:  Feb 16, 1956   DATE OF PROCEDURE:  07/21/2003  DATE OF DISCHARGE:                                 OPERATIVE REPORT   PREOPERATIVE DIAGNOSES:  Left carpal tunnel syndrome, greater than right  carpal tunnel syndrome.   POSTOPERATIVE DIAGNOSES:  Left carpal tunnel syndrome, greater than right  carpal tunnel syndrome.   PROCEDURE:  1. Left limited open carpal tunnel release.  2. Left median nerve and field block (peripheral nerve block, for anesthetic     purposes for a carpal tunnel release), left wrist.  3. Right carpal tunnel injection with 0.5 mL of Depo-Medrol, lidocaine and     Marcaine mixture.   SURGEON:  Dionne Ano. Amanda Pea, M.D.   ASSISTANT:  Karie Chimera, P.A.-C.   COMPLICATIONS:  None.   TOURNIQUET TIME:  Less than 10 minutes.   ESTIMATED BLOOD LOSS:  Minimal.   INDICATIONS FOR PROCEDURE:  The patient is a very pleasant female who  presents with the above-mentioned diagnosis.  A discussion with regards to  the risks and benefits of surgery, including the risks of infection,  bleeding, anesthesia, damage to normal structures, and failure of the  surgery to accomplish its intended goals and relieving symptoms and  restoring function.  With this in mind, she desires to proceed.  All  questions have been encouraged and answered preoperatively.   FINDINGS:  The patient had a thickened transverse carpal ligament separated  by the __________ incision.  She had a full release accomplished without  difficulty.  There were no complicating features.  The right carpal tunnel  was injected without problems.  She was awake and oriented, with an  excellent block during her release.   DESCRIPTION OF  PROCEDURE:  The patient received a sufficient amount of  anesthesia and was taken to the operative suite, and underwent light IV  sedation, and then was given a peripheral nerve block (median nerve block),  and a field block by myself at the wrist level.  Following this she was  prepped and draped in the usual sterile fashion with Betadine scrub and  paint.  Once this was done, the arm was elevated and the tourniquet inflated  to 250 mmHg and under a sterile field the patient had a 1-cm incision made  at the distal transverse carpal ligament.  Dissection was carried down  through the skin with knife blade.  The palmar fascia was incised.  The  distal edge of the transverse carpal ligament was identified and opened  under four-point serial loupe magnification.  She had a small palmaris  brevis muscle which was released.  The fat pad was egressed nicely.  There  were no aberrant median nerve branches, and following this, distal to  proximal dissection was carried out until adequate room was available for  canal preparatory devices.  The canal preparatory devices 1, 2, and 3 were  placed directly under the proximal leading leaflet of the transverse carpal  ligament, and then with the patient awake and without discomfort.  I then  placed a security clip just below the leading leaflet of the proximal  transverse carpal ligament.  It engaged nicely with the obturator  disengaging, revealing the correct placement.  I then placed a security  knife and security clip, and prepared for releasing the proximal edge of the  transverse carpal ligament.  I then checked the wound.  The patient had the  security clip removed.  She had a full release of the transverse carpal  ligament.  The median nerve was intact with hyperemia and no iatrogenic  injury.  I then irrigated the wound copiously and deflated the tourniquet in  less than 10 minutes.  I obtained hemostasis with bipolar electrocautery,  and sutured  the wound with interrupted Prolene.  A sterile dressing was  applied.  She tolerated the procedure without difficulty.  Following this, the patient then underwent a right carpal tunnel injection  with Depo-Medrol, lidocaine and Marcaine mixture.  This was 0.5 mL of Depo-  Medrol accomplished under sterile conditions without difficulty.  She tolerated this well and there were no complicating features.  Once this  was done, she was then transferred to the recovery room.  She was noted to  be in excellent condition in the recovery room.   DISPOSITION:  She will be discharged home.  She will return to see [place] in  seven days and therapy in 14 days.  I have discussed her condition, etc.,  and all questions have been encouraged and answered.                                               Dionne Ano. Everlene Other, M.D.    Nash Mantis  D:  07/21/2003  T:  07/22/2003  Job:  [id number]

## 2010-12-27 NOTE — Letter (Signed)
May 20, 2006     Julie Neal  [address]  [place], [place] [address]   RE:  ANIZA, SHOR  MRN:  [id number]  /  DOB:  [birth year]   Dear Ms. Boulais,   We find it necessary to inform you that we will no longer be able to provide  medical care to you because of recent false statements at your last visit  regarding having a family member drive you home after your Demerol shot.   Since your condition requires continued medical attention, we suggest that  you place yourself under the care of another physician without delay.  If  you desire, we will be available for emergency care for a reasonable time  after you receive this letter, but in the event, no later than 30 days.   This should give you ample time to select a physician of your choice from  the many competent providers in this area.  You may want to call the local  medical society or [facility] Physician Referral Service for  their assistance in locating a new physician.  With your written  authorization, we will make a copy of your medical records available to your  new physician.    Sincerely,     ______________________________  Corwin Levins, MD    JWJ/MedQ  /  Job #:  [id number]  DD:  05/20/2006 / DT:  05/22/2006

## 2010-12-27 NOTE — Procedures (Signed)
NAME:  Julie Neal, Julie Neal                ACCOUNT NO.:  [id number]   MEDICAL RECORD NO.:  [id number]          PATIENT TYPE:  OUT   LOCATION:  SLEEP CENTER                 [facility]ACILITY[facility]   PHYSICIAN:  Clinton D. Maple Hudson, M.D. DATE OF BIRTH:  05-Oct-1955   DATE OF STUDY:  03/18/2005                              NOCTURNAL POLYSOMNOGRAM   REFERRING PHYSICIAN:  Dr. Santiago Glad   DATE OF STUDY:  March 18, 2005   INDICATION FOR STUDY:  Insomnia with sleep apnea. Epworth Sleepiness Score  10/24, BMI 29, weight 185 pounds.   SLEEP ARCHITECTURE:  Total sleep time 335 minutes with sleep efficiency 77%.  Stage I was 7%, stage II 60%, stages III and IV 32%, REM absent. Sleep  latency 42 minutes, awake after sleep onset 60 minutes, arousal index  increased at 86. Bedtime medications included Lexapro, Crestor, Diovan,  Zanaflex, Topamax, Cenestin and Ambien CR. She has clonazepam which was not  taken.   RESPIRATORY DATA:  Respiratory disturbance index (RDI, AHI) 93.2 obstructive  events per hour indicating severe obstructive sleep apnea/hypopnea syndrome.  There were 22 obstructive apneas and 204 hypopneas. Most sleep and events  were recorded while on right side. By split study protocol CPAP was titrated  to 5 CWP, RDI 1 per hour using a Respironics ComfortSelect 2 Full Face Mask  with heated humidifier.   OXYGEN DATA:  Mild snoring with oxygen desaturation to a nadir of 89% before  CPAP. After CPAP control saturation held 96-98% on room air.   CARDIAC DATA:  Normal sinus rhythm.   MOVEMENT/PARASOMNIA:  A total of 284 limb jerks were recorded of which 173  were associated with arousal or awakening for a significant periodic limb  movement with arousal index of 13.9 per hour.   IMPRESSION/RECOMMENDATION:  1.  Severe obstructive sleep apnea/hypopnea syndrome, Respiratory      Disturbance Index 93.2 per hour with mild snoring and oxygen      desaturation to 89% before continuous positive  airway pressure.  2.  Successful continuous positive airway pressure titration to 5 CWP,      Respiratory Disturbance Index 1 per hour using a Medium Respironics      ComfortSelect 2 Full Face Mask with heated humidifier.  3.  Periodic limb movement with arousal, 13.9 per hour. If this pattern      persists after adjustment to home continuous positive airway pressure      then she may benefit from directed medication such as Requip or      clonazepam. Recognize that clonazepam was not taken on this study night.      Some medications especially including tricyclic antidepressants have      been associated with increased incidence of periodic limb movement.      Clinton D. Maple Hudson, M.D.  Diplomate, Biomedical engineer of Sleep Medicine  Electronically Signed     CDY/MEDQ  D:  03/23/2005 11:02:22  T:  03/23/2005 22:15:10  Job:  [id number]

## 2010-12-27 NOTE — Op Note (Signed)
[place]  Patient:    Julie Neal, Julie Neal Visit Number: [id number] MRN: [id number]          Service Type: SUR Location: [id number] Attending Physician:  Cristi Loron Dictated by:   Cristi Loron, M.D. Proc. Date: 11/25/01 Admit Date:  11/25/2001 Discharge Date: 11/26/2001                             Operative Report  PREOPERATIVE DIAGNOSES:  C5-6 degenerative disk disease, spondylosis, stenosis, cervicalgia, cervical radiculopathy.  POSTOPERATIVE DIAGNOSES:  C5-6 degenerative disk disease, spondylosis, stenosis, cervicalgia, cervical radiculopathy.  PROCEDURES:  C5-6 extensive anterior cervical diskectomy, interbody iliac crest allograft arthrodesis, anterior cervical plating (Synthes small-stature titanium plate and screws).  SURGEON:  Cristi Loron, M.D.  ASSISTANT:  Mena Goes. Franky Macho, M.D.  ANESTHESIA:  General endotracheal.  ESTIMATED BLOOD LOSS:  100 cc.  SPECIMENS:  None.  DRAINS:  None.  COMPLICATIONS:  None.  BRIEF HISTORY:  The patient is a 55 year old white female who has suffered from neck and left arm pain.  She failed medical management and was worked up with a cervical MRI and a cervical myelo-CT which demonstrated C5-6 spondylosis, stenosis, degenerative disk disease.  I discussed the various treatment options with her, including surgery.  The patient weighed the risks, benefits, and alternatives of surgery and decided to proceed with the operation.  DESCRIPTION OF PROCEDURE:  The patient was brought to the operating room by the anesthesia team.  General endotracheal anesthesia was induced.  The patient remained in the supine position.  A roll was placed under her shoulders to place her neck in slight extension.  Her anterior cervical region was then prepared with Betadine scrub and Betadine solution, and sterile drapes were applied.  I then injected the area to be incised with Marcaine with  epinephrine solution and used a scalpel to make a left transverse incision in the patients left-sided neck.  I used the Metzenbaum scissors to divide the platysma muscle and then dissect medial to the sternocleidomastoid muscle, jugular vein, and carotid artery.  I carefully bluntly dissected down toward the anterior cervical spine and identified the esophagus and retracted it medially.  I cleared the soft tissue from the anterior cervical spine and inserted a bent spinal needle into the upper exposed interspace.  I then obtained the intraoperative radiograph to confirm our location and then used electrocautery to detach the medial border of the longus colli muscle bilaterally from the C5-6 intervertebral disk space and then inserted the Caspar self-retaining retractor for exposure.  I incised the C5-6 interspace with a 15 blade scalpel and performed a partial diskectomy using the Karlin curettes and the pituitary forceps.  I inserted distraction screws at C5 and C6, distracted the interspace.  I then used the high-speed drill to decorticate the vertebral end plates at L2-4 and drill away the remainder of the intervertebral disk as well as to thin out the posterior longitudinal ligament.  I then incised the ligament with the arachnoid knife.  I then removed it with the Kerrison punch, undercutting the vertebral end plates at M0-1, decompressing the thecal sac.  I then performed a foraminotomy about the bilateral C5-6 nerve root, removing small bone spurs in the foramen, decompressing both nerves.  I now turned my attention to the arthrodesis.  I obtained iliac crest tricortical allograft bone graft and fashioned it to these approximate dimensions:  7 mm in  height, 1 cm in depth.  I inserted the bone graft into the distracted interspace and then removed the distraction screws.  There was a good, snug fit of the bone graft.  I now turned my attention to the anterior spinal instrumentation.   I obtained the appropriate length Synthes titanium plate and then laid it along the anterior aspect of the vertebral bodies at C5 and C6.  I drilled two holes at C5, two at C6, and then tapped the holes and secured the plate to the vertebral bodies with two 12 mm screws at each vertebral body.  I then obtained the intraoperative radiograph that demonstrated good position of plate, screws, and interbody graft.  I then secured the screws to the plate using the locking screw at each screw.  I then achieved stringent hemostasis using bipolar electrocautery.  I copiously irrigated the wound out with bacitracin solution and then removed the solution.  I then removed the Caspar self-retaining retractor and inspected the esophagus for any damage.  There was none apparent.  I reapproximated the patients platysma muscle with interrupted 3-0 Vicryl suture and the subcutaneous tissue with interrupted 3-0 Vicryl suture and the skin with Steri-Strips and benzoin.  The wound was then coated with bacitracin ointment and a sterile dressing was applied.  The drapes were removed.  The patient was subsequently extubated by the anesthesia team and transported to the postanesthesia care unit in stable condition.  All sponge, instrument, and needle counts were correct at the end of this case. Dictated by:   Cristi Loron, M.D. Attending Physician:  Tressie Stalker D DD:  11/25/01 TD:  11/27/01 Job: [id number]

## 2010-12-27 NOTE — Op Note (Signed)
NAMEKIMBERLEIGH, MEHAN                ACCOUNT NO.:  [id number]   MEDICAL RECORD NO.:  [id number]          PATIENT TYPE:  AMB   LOCATION:  DSC                          FACILITY:  [facility]   PHYSICIAN:  Leonides Grills, M.D.     DATE OF BIRTH:  02-17-1956   DATE OF PROCEDURE:  09/25/2004  DATE OF DISCHARGE:                                 OPERATIVE REPORT   PREOPERATIVE DIAGNOSES:  1.  Left ankle impingement.  2.  Left tibial spurs.   POSTOPERATIVE DIAGNOSES:  1.  Left ankle impingement.  2.  Left tibial spurs.   OPERATION:  1.  Left ankle arthroscopy with extensive debridement.  2.  Excision left tibial spurs.   ANESTHESIA:  General endotracheal tube.   SURGEON:  Leonides Grills, M.D.   ASSISTANT:  Lianne Cure, P.A.C.   ESTIMATED BLOOD LOSS:  Minimal.   TOURNIQUET:  None.   COMPLICATION:  None.   DISPOSITION:  Stable to the PR.   INDICATION:  This is a 55 year old female who has had longstanding anterior  ankle pain that was interfering with her life to the point she could not do  what she wants to do.  She was consented for the above procedure.  All  risks, which include infection, nerve vessel injury, prolonged recovery,  stiffness, arthritis, sinus formation were all explained, questions  encouraged and answered.   OPERATION:  The patient brought to the operating room, placed in the supine  position after adequate general endotracheal tube anesthesia was  administered as well as Ancef 1 gm IVPB.  The left lower extremity was then  prepped and draped in a sterile manner, no tourniquet was used.  Anatomical  landmarks were then mapped on the anterior tibialis, peroneus tertius and  superficial peroneal nerve.  A spinal needle was then placed just medial to  anterior tibialis, then into the joint.  Normal saline 20 mL was then placed  in.  A nick and spread technique was then utilized to create the  anteromedial portal.  Blunt tip trocar followed by cannula followed by  camera was then placed into the ankle.  Under direct visualization, lateral  to the peroneus tertius and superficial peroneal nerve spinal needle,  followed by nick and spread technique, was utilized to create the  anterolateral portal.  There was a tremendous amount of synovitis in the  anterior aspect of the ankle, this was extensively debrided with a bevel and  shaver.  There was also a large osteophyte off the anterior aspect of the  distal tibia.  The wound was extended to adequately remove this osteophyte  and once the arthrotomy was extended and instrumentation was adequate we  removed the osteophyte with the bur __________ synovectomy rongeur.  This  was completely decompressed.  Range of motion of the ankle seen  intraoperatively with the camera showed no impingement as well and there was  no loose bodies within the joint as well.  The remaining portion of  cartilage was pristine, camera was removed, wound was closed with 4-0 nylon  suture over all wounds.  A sterile dressing  was applied.  A Cam Walker boot  was applied.  The patient was table to the PR.      PB/MEDQ  D:  09/25/2004  T:  09/25/2004  Job:  [id number]

## 2010-12-27 NOTE — Op Note (Signed)
NAME:  Julie Neal, Julie Neal                          ACCOUNT NO.:  [id number]   MEDICAL RECORD NO.:  [id number]                   PATIENT TYPE:  AMB   LOCATION:  DSC                                  FACILITY:  [facility]   PHYSICIAN:  Jene Every, M.D.                 DATE OF BIRTH:  31-Dec-1955   DATE OF PROCEDURE:  11/23/2002  DATE OF DISCHARGE:                                 OPERATIVE REPORT   PREOPERATIVE DIAGNOSIS:  Impingement syndrome, left shoulder, due to rotator  cuff arthropathy.   POSTOPERATIVE DIAGNOSIS:  Impingement syndrome, left shoulder, due to  rotator cuff arthropathy, partial tear rotator cuff.   PROCEDURE PERFORMED:  Left shoulder arthroscopy, subacromial decompression  and bursectomy, debridement of rotator cuff tear.   ANESTHESIA:  General.   ASSISTANT:  Roma Schanz, P.A.   BRIEF HISTORY AND INDICATION:  A 55 year old with refractory shoulder pain,  MRI indicating tendinopathy, possible tear.  Brief relief of pain with  subacromial cortical steroid injection that had returned.  Due to persistent  symptoms, operative intervention is indicated for bursectomy, evaluation of  the rotator cuff and if significant tear was noted, open repair,  decompression by acromioplasty and bursectomy.  Risks and benefits discussed  including, bleeding, infection, damage to neurovascular structures,  inability for the tear to heal, and need for repeat debridement, etc.   DESCRIPTION OF PROCEDURE:  The patient was placed in the supine position.  After an adequate level of general anesthesia and 1 g of Kefzol, the patient  was placed in the right lateral decubitus position.  All bony prominences  were well padded.  The left shoulder and upper extremity was prepped and  draped in the usual sterile fashion.  The arm was placed in 70/30 position  to delineate the acromion with a marker, the [facility] joint, and the coracoid.  Incision was made in the standard posterolateral portion of  the acromion.  Blunt cannula was introduced and into the glenohumeral joint after the arm  was placed in balanced traction in the 70/30 position, with 10 pounds.  We  penetrated the capsule atraumatically, irrigated and insufflated the joint.  With the camera inserted, full inspection revealed no evidence of a rotator  cuff tear from the articular surface, normal humeral head with some slight  degenerative changes of the glenoid, no evidence of labral tear, body  substance unremarkable.  Subscapularis was unremarkable.  The joint was  copiously lavaged, and we directed the cannula into the subacromial space  and placed an anterolateral portal through an incision through the skin, and  the cannula was introduced.  First noted was hypertrophic bursa,  inflammatory tissue that was significant.  The shaver was introduced, and  utilizing the shaver, we performed a full bursectomy.  Following that, I  released the CA ligament with an ArthroWand.  I delineated the anterolateral  margin of the acromion which had a  small spur associated with it, and  performed there an acromioplasty on the anterolateral surface.  I used an 18-  gauge to delineate the inferolateral surface and the AC joint.  The [facility] joint  was found to not be impinging into the subacromial space in a significant  manner.  Therefore, that was left untouched.  A full bursectomy was  performed anteriorly and posteriorly, and we looked out laterally.  She had  a small partial tear in the supraspinatus anterolateral aspect corresponding  to underneath where the spur was.  I looked at that fully and probed it.  It  was very insignificant, maybe 10-15% of the thickness.  I debrided that.  Good bleeding tissue was noted.  I copiously lavaged the joint.  After the  CA ligament was detached and morselized, we then copiously lavaged the joint  performing the acromioplasty in the 45/0 position.  After this was copiously  lavaged, all  instrumentation was then removed.  The portals were closed with  4-0 nylon simple sutures.  Marcaine 0.25% with epinephrine was infiltrated  in the subacromial joint.  The patient was then released from traction,  placed in a sling, extubated without difficulty, and transported to recovery  in satisfactory condition.   The patient tolerated the procedure well with no complications.                                               Jene Every, M.D.    Cordelia Pen  D:  11/23/2002  T:  11/23/2002  Job:  [id number]

## 2011-02-03 ENCOUNTER — Other Ambulatory Visit (HOSPITAL_COMMUNITY): Payer: Self-pay | Admitting: Physical Medicine and Rehabilitation

## 2011-02-03 ENCOUNTER — Ambulatory Visit (HOSPITAL_COMMUNITY)
Admission: RE | Admit: 2011-02-03 | Discharge: 2011-02-03 | Disposition: A | Discharge status: Home / Self Care | Payer: Medicare PPO | Source: Ambulatory Visit | Attending: Physical Medicine and Rehabilitation | Admitting: Physical Medicine and Rehabilitation

## 2011-02-03 ENCOUNTER — Ambulatory Visit (HOSPITAL_BASED_OUTPATIENT_CLINIC_OR_DEPARTMENT_OTHER)
Admission: RE | Admit: 2011-02-03 | Discharge: 2011-02-03 | Disposition: A | Discharge status: Home / Self Care | Payer: Medicare PPO | Source: Ambulatory Visit | Attending: Physical Medicine and Rehabilitation | Admitting: Physical Medicine and Rehabilitation

## 2011-02-03 DIAGNOSIS — M961 Postlaminectomy syndrome, not elsewhere classified: Secondary | ICD-10-CM | POA: Insufficient documentation

## 2011-02-03 DIAGNOSIS — M546 Pain in thoracic spine: Secondary | ICD-10-CM | POA: Insufficient documentation

## 2011-02-03 DIAGNOSIS — M51379 Other intervertebral disc degeneration, lumbosacral region without mention of lumbar back pain or lower extremity pain: Secondary | ICD-10-CM | POA: Insufficient documentation

## 2011-02-03 DIAGNOSIS — M5137 Other intervertebral disc degeneration, lumbosacral region: Secondary | ICD-10-CM | POA: Insufficient documentation

## 2011-02-03 DIAGNOSIS — R52 Pain, unspecified: Secondary | ICD-10-CM

## 2011-02-03 DIAGNOSIS — Z01810 Encounter for preprocedural cardiovascular examination: Secondary | ICD-10-CM | POA: Insufficient documentation

## 2011-02-03 DIAGNOSIS — Z01812 Encounter for preprocedural laboratory examination: Secondary | ICD-10-CM | POA: Insufficient documentation

## 2011-02-03 DIAGNOSIS — Z981 Arthrodesis status: Secondary | ICD-10-CM | POA: Insufficient documentation

## 2011-02-03 LAB — POCT HEMOGLOBIN-HEMACUE: Hemoglobin: 13.8 g/dL (ref 12.0–15.0)

## 2011-02-05 NOTE — Op Note (Addendum)
NAMEGORGEOUS, NEWLUN                ACCOUNT NO.:  [id number]  MEDICAL RECORD NO.:  [id number]  LOCATION:  XRAY                         FACILITY:  [facility]  PHYSICIAN:  Caralyn Guile. Ethelene Hal, M.D. DATE OF BIRTH:  1956-01-25  DATE OF PROCEDURE:  02/03/2011 DATE OF DISCHARGE:  02/03/2011                              OPERATIVE REPORT   Julie Neal is a 55 year old female who has chronic bilateral lower limb pain.  She has had previous back surgery.  She has failed conservative treatment with regards to her lower back condition.  No further surgery has been recommended at this point.  She also has had a spinal cord stimulator placed for chronic neck and arm pain.  She is scheduled today for a percutaneous placement of the spinal cord stimulator lead to help with her persistent back and bilateral lower limb pain.  PREOPERATIVE DIAGNOSES:  Post laminectomy syndrome, bilateral radiculitis, multilevel degenerative disk disease, L4-5, L5-S1.  POSTOPERATIVE DIAGNOSES:  Post laminectomy syndrome, bilateral radiculitis, multilevel degenerative disk disease, L4-5, L5-S1.  PROCEDURE:  Percutaneous placement of a Medtronic Oxford lead.  PROCEDURE:  After informed consent was signed, the patient was brought back to the operating room and placed prone on the operating table.  She was premedicated with a gram of Ancef IV.  She does have a penicillin allergy, but she had no complications with a test dose of the Ancef. She did receive the full gram of Ancef during the procedure.  Skin was cleansed with ChloraPrep x1.  She was sterilely draped.  With a 14-gauge Tuohy 3-1/2 inch spinal needle, I placed this in a T12-L1 interspace under AP fluoroscopic imaging in the midline.  I then placed the Medtronic spinal cord stimulator lead through the introducer needle using intermittent fluoroscopic imaging.  I threaded the spinal cord stimulator lead to the mid part of the T7 vertebral body.  This was checked under  AP and lateral fluoroscopic imaging.  It was appropriately in the posterior epidural space.  I then hooked up a spinal cord stimulator to the impulse generator.  We were able to get bilateral lower limb coverage.  Coverage was offered in her buttock area, questionable whether or not I got any lower back pain coverage, which is very difficult to get and this was discussed with the patient.  I felt that this was appropriate placement.  I then withdrew the introducer needle as well as the stylet.  I made sure that I did not move the spinal cord stimulator lead using one intermittent fluoroscopic image. The lead was sutured to the skin with 2-0 silk x2.  Pressure bandage was applied.  The patient tolerated the procedure very well.  She was brought back to the recovery area in a stable condition.  She will follow up with me in 1 week to review today's procedure to see if she has improvement in her pain and improvement in her function. Appropriate discharge instructions were given with regards to keeping it clean and dry.  She should not drive with the spinal cord stimulator on. I will see her back in 1 week.     Julie Neal D. Ethelene Hal, M.D.   ______________________________ Caralyn Guile. Julie Neal,  M.D.    RDR/MEDQ  D:  02/03/2011  T:  02/04/2011  Job:  [id number]  Electronically Signed by Sheran Luz M.D. on 06/11/2011 01:35:22 PM

## 2011-02-25 ENCOUNTER — Other Ambulatory Visit: Payer: Self-pay | Admitting: Neurology

## 2011-02-25 DIAGNOSIS — R413 Other amnesia: Secondary | ICD-10-CM

## 2011-02-25 DIAGNOSIS — F341 Dysthymic disorder: Secondary | ICD-10-CM

## 2011-02-27 ENCOUNTER — Ambulatory Visit
Admission: RE | Admit: 2011-02-27 | Discharge: 2011-02-27 | Disposition: A | Discharge status: Home / Self Care | Payer: 59 | Source: Ambulatory Visit | Attending: Neurology | Admitting: Neurology

## 2011-02-27 DIAGNOSIS — F341 Dysthymic disorder: Secondary | ICD-10-CM

## 2011-02-27 DIAGNOSIS — R413 Other amnesia: Secondary | ICD-10-CM

## 2011-03-17 ENCOUNTER — Other Ambulatory Visit: Payer: Self-pay | Admitting: Orthopedic Surgery

## 2011-03-17 DIAGNOSIS — M549 Dorsalgia, unspecified: Secondary | ICD-10-CM

## 2011-03-21 ENCOUNTER — Ambulatory Visit
Admission: RE | Admit: 2011-03-21 | Discharge: 2011-03-21 | Disposition: A | Discharge status: Home / Self Care | Payer: 59 | Source: Ambulatory Visit | Attending: Orthopedic Surgery | Admitting: Orthopedic Surgery

## 2011-03-21 DIAGNOSIS — M549 Dorsalgia, unspecified: Secondary | ICD-10-CM

## 2011-03-31 ENCOUNTER — Encounter (HOSPITAL_COMMUNITY)
Admission: RE | Admit: 2011-03-31 | Discharge: 2011-03-31 | Disposition: A | Discharge status: Home / Self Care | Payer: Medicare PPO | Source: Ambulatory Visit | Attending: Orthopedic Surgery | Admitting: Orthopedic Surgery

## 2011-03-31 LAB — CBC
HCT: 38.1 % (ref 36.0–46.0)
Hemoglobin: 13.2 g/dL (ref 12.0–15.0)
MCH: 31 pg (ref 26.0–34.0)
MCHC: 34.6 g/dL (ref 30.0–36.0)
RDW: 12.5 % (ref 11.5–15.5)

## 2011-03-31 LAB — SURGICAL PCR SCREEN: MRSA, PCR: NEGATIVE

## 2011-04-09 ENCOUNTER — Ambulatory Visit (HOSPITAL_COMMUNITY)
Admission: RE | Admit: 2011-04-09 | Discharge: 2011-04-10 | Disposition: A | Discharge status: Home / Self Care | Payer: Medicare PPO | Source: Ambulatory Visit | Attending: Orthopedic Surgery | Admitting: Orthopedic Surgery

## 2011-04-09 ENCOUNTER — Ambulatory Visit (HOSPITAL_COMMUNITY): Payer: Medicare PPO

## 2011-04-09 DIAGNOSIS — Z23 Encounter for immunization: Secondary | ICD-10-CM | POA: Insufficient documentation

## 2011-04-09 DIAGNOSIS — Z01812 Encounter for preprocedural laboratory examination: Secondary | ICD-10-CM | POA: Insufficient documentation

## 2011-04-09 DIAGNOSIS — M79609 Pain in unspecified limb: Secondary | ICD-10-CM | POA: Insufficient documentation

## 2011-04-09 DIAGNOSIS — G8929 Other chronic pain: Secondary | ICD-10-CM | POA: Insufficient documentation

## 2011-04-09 DIAGNOSIS — M545 Low back pain, unspecified: Secondary | ICD-10-CM | POA: Insufficient documentation

## 2011-04-09 DIAGNOSIS — Z981 Arthrodesis status: Secondary | ICD-10-CM | POA: Insufficient documentation

## 2011-04-11 NOTE — Op Note (Signed)
Julie Neal, IPOCK                ACCOUNT NO.:  [id number]  MEDICAL RECORD NO.:  [id number]  LOCATION:  5021                         FACILITY:  [facility]  PHYSICIAN:  Julie Beal, MD    DATE OF BIRTH:  1955/11/23  DATE OF PROCEDURE:  04/09/2011 DATE OF DISCHARGE:                              OPERATIVE REPORT   PREOPERATIVE DIAGNOSIS:  Failed back syndrome with chronic back, buttock and leg pain.  POSTOPERATIVE DIAGNOSIS:  Failed back syndrome with chronic back, buttock and leg pain.  OPERATIVE PROCEDURE:  Implantation of spinal cord stimulator.  INSTRUMENTATION USED:  Medtronic tripole 8 x 3 lead placed through a T9 laminotomy spanning from midbody of T7 to mid T8-9 disk space battery placement was on the right side per the patient's request functioning adequately prior to closure.  HISTORY:  This is a very pleasant 54 year old woman who is having significant back, buttock and leg pain.  She has had pain management, surgery, medications and still had poor quality of life.  She had a trial of spinal cord stimulator placed and as a result she was referred to me for permanent implantation.  All appropriate risks, benefits and alternatives were discussed with the patient.  Consent was obtained.  It should be noted that the first assistant was Norval Gable, [place]  OPERATIVE NOTE:  The patient was brought to the operating room, placed supine on the operating room table.  After successful induction of general anesthesia and endotracheal intubation, TED and SCDs were applied.  She was turned prone onto a Wilson frame.  All bony prominences were well padded.  Back was prepped and draped in standard fashion.  Appropriate time-out was done confirming patient, procedure, affected extremity, and all other pertinent important data.  X-ray was then brought in AP and lateral planes.  I counted from the L5 vertebral body up to the T9 vertebral body.  Once I had the T9 vertebral body , I  made an incision starting at the T9 pedicle and proceeding down to superior aspect of T11 pedicle.  Sharp dissection was carried out down to the deep fascia.  The deep fascia was sharply incised and exposed the spinous process of the lamina of T9-T10.  I then identified the T12 rib on AP film and counted up to confirm the T9 level.  Once this was done, I then proceeded with the laminectomy.  Using a double- action Leksell rongeur, I removed the bulk majority of the T9 spinous process and then using a neural curette, I developed a plane underneath the lamina of T9.  Using a 2-mm Kerrison, I performed a generous laminotomy at T9.  I then dissected through the central raphe of the ligamentum flavum and then removed the ligamentum flavum to expose the underlying thecal sac.  I then passed a dural elevator on the dorsal surface of the canal and then obtained the implant.  I __________ it down and then passed the implant gently so that it came to rest at the midbody of T7 and spanning on the entire T8 vertebral body.  This was centrally placed and was adequate and mimic the programming area that was successful with the trial.  I then sutured the leads directly to the spinous processes of T10 with FiberWire through bone hole and then wrapped it in the inner spinous process space between T10-T11.  I then irrigated that wound copiously with normal saline, closed the deep fascia and then made a second incision at the battery site.  I then used a submuscular passer connected to incisions and passed the lead to the right lower gluteal region where the battery was placed.  Once the wires were passed submuscular, I irrigated the thoracic wound, closed it in layered fashion with interrupted #1 Vicryl suture and a 2-0 Vicryl suture and 3-0 Monocryl.  I connected the leads to the battery and tested the battery.  There was some problem with the initial battery. We then tested the leads individually.  The  leads were functioning fine and so a new battery was obtained and it functioned adequately.  The malfunctioned battery was returned to the manufacturer.  The battery was then sutured into the wound and I again tested the battery once it was in the gluteal pocket.  I then irrigated the wound copiously with normal saline, closed it in a similar fashion.  Steri-Strips, dry dressing were applied.  At the end of the case, all needle and sponge counts were correct.  The patient was extubated and transferred to the PACU without incident.     Julie Beal, MD     DDB/MEDQ  D:  04/09/2011  T:  04/09/2011  Job:  [id number]  Electronically Signed by Venita Lick MD on 04/11/2011 03:50:56 PM

## 2011-05-15 LAB — CBC
HCT: 41.4 % (ref 36.0–46.0)
MCV: 93.5 fL (ref 78.0–100.0)
Platelets: 203 10*3/uL (ref 150–400)
RDW: 12.8 % (ref 11.5–15.5)

## 2011-05-15 LAB — BASIC METABOLIC PANEL
BUN: 12 mg/dL (ref 6–23)
Chloride: 101 mEq/L (ref 96–112)
Glucose, Bld: 80 mg/dL (ref 70–99)
Potassium: 4.2 mEq/L (ref 3.5–5.1)

## 2011-06-16 ENCOUNTER — Ambulatory Visit
Admission: RE | Admit: 2011-06-16 | Discharge: 2011-06-16 | Disposition: A | Discharge status: Home / Self Care | Payer: Medicare PPO | Source: Ambulatory Visit | Attending: Rheumatology | Admitting: Rheumatology

## 2011-06-16 ENCOUNTER — Other Ambulatory Visit: Payer: Self-pay | Admitting: Rheumatology

## 2011-06-16 DIAGNOSIS — M545 Low back pain: Secondary | ICD-10-CM

## 2012-07-14 ENCOUNTER — Ambulatory Visit: Payer: Medicare PPO | Admitting: Physical Therapy

## 2012-11-02 ENCOUNTER — Telehealth: Payer: Self-pay | Admitting: Family Medicine

## 2012-11-02 DIAGNOSIS — F419 Anxiety disorder, unspecified: Secondary | ICD-10-CM

## 2012-11-02 MED ORDER — DIAZEPAM 5 MG PO TABS: 5.0000 mg | ORAL_TABLET | Freq: Two times a day (BID) | ORAL | Status: DC | PRN

## 2012-11-02 NOTE — Telephone Encounter (Signed)
Last OV 07/23/12.  Last refill this med 08/30/12 #60. Diazepam 5mg  BID PRN Need approval for controlled medication.

## 2012-11-02 NOTE — Telephone Encounter (Signed)
Ok to call out 60 

## 2012-11-02 NOTE — Telephone Encounter (Signed)
Medication refilled per protocol. 

## 2012-11-05 ENCOUNTER — Encounter: Payer: Self-pay | Admitting: Family Medicine

## 2012-11-05 NOTE — Telephone Encounter (Signed)
Can you make this an errorneous encounter please. This had already been done. Sorry! Thanks!

## 2012-11-05 NOTE — Telephone Encounter (Signed)
This encounter was created in error - please disregard.

## 2012-11-23 ENCOUNTER — Other Ambulatory Visit: Payer: Self-pay | Admitting: Physical Medicine and Rehabilitation

## 2012-11-23 DIAGNOSIS — M5416 Radiculopathy, lumbar region: Secondary | ICD-10-CM

## 2012-12-01 ENCOUNTER — Other Ambulatory Visit: Payer: Medicare PPO

## 2012-12-03 ENCOUNTER — Other Ambulatory Visit: Payer: Self-pay | Admitting: Family Medicine

## 2012-12-03 ENCOUNTER — Telehealth: Payer: Self-pay | Admitting: Family Medicine

## 2012-12-03 ENCOUNTER — Ambulatory Visit (INDEPENDENT_AMBULATORY_CARE_PROVIDER_SITE_OTHER): Payer: Medicare Other | Admitting: Family Medicine

## 2012-12-03 ENCOUNTER — Encounter: Payer: Self-pay | Admitting: Family Medicine

## 2012-12-03 VITALS — BP 128/78 | HR 78 | Temp 98.3°F | Resp 16 | Wt 191.0 lb

## 2012-12-03 DIAGNOSIS — G43909 Migraine, unspecified, not intractable, without status migrainosus: Secondary | ICD-10-CM

## 2012-12-03 DIAGNOSIS — F339 Major depressive disorder, recurrent, unspecified: Secondary | ICD-10-CM | POA: Insufficient documentation

## 2012-12-03 DIAGNOSIS — G44009 Cluster headache syndrome, unspecified, not intractable: Secondary | ICD-10-CM | POA: Insufficient documentation

## 2012-12-03 DIAGNOSIS — E782 Mixed hyperlipidemia: Secondary | ICD-10-CM | POA: Insufficient documentation

## 2012-12-03 DIAGNOSIS — F329 Major depressive disorder, single episode, unspecified: Secondary | ICD-10-CM | POA: Insufficient documentation

## 2012-12-03 DIAGNOSIS — E785 Hyperlipidemia, unspecified: Secondary | ICD-10-CM | POA: Insufficient documentation

## 2012-12-03 MED ORDER — PROMETHAZINE HCL 25 MG/ML IJ SOLN
25.0000 mg | Freq: Once | INTRAMUSCULAR | Status: AC
  Administered 2012-12-03: 25 mg via INTRAVENOUS

## 2012-12-03 MED ORDER — TOPIRAMATE ER 100 MG PO CAP24: 100.0000 mg | ORAL_CAPSULE | Freq: Every day | ORAL | Status: DC

## 2012-12-03 MED ORDER — KETOROLAC TROMETHAMINE 60 MG/2ML IM SOLN
60.0000 mg | Freq: Once | INTRAMUSCULAR | Status: AC
  Administered 2012-12-03: 60 mg via INTRAMUSCULAR

## 2012-12-03 NOTE — Telephone Encounter (Signed)
Pt having severe migraine HA.  Wants to be seen or can you order a shot and she can come get from one of the nurses??  Please advise.

## 2012-12-03 NOTE — Progress Notes (Signed)
Subjective:    Patient ID: Julie Neal, female    DOB: June 28, 1956, 57 y.o.   MRN: [id number]  HPI  Patient developed an intractable migraine early this morning. She tried one Percocet which did not relieve the pain. Currently she is having a bad 10 pain the pain is located in her forehead. It is pounding in nature. She has photophobia and phonophobia. She is also has nausea.  She has not thrown up. She gets frequent migraines.  She has a documented allergy to Mobic. However she has used Toradol injections in the past without problems. I last saw her in December. At that time I began her onTrokandi XR 100 mg poqday for migraine prophylaxis. She states that worked well. However she had to stop it because her insurance wouldn't pay for it. Past Medical History  Diagnosis Date  . Hyperlipidemia   . Depression   . Neuralgic migraines    Current outpatient prescriptions:citalopram (CELEXA) 20 MG tablet, Take 20 mg by mouth daily., Disp: , Rfl: ;  diazepam (VALIUM) 5 MG tablet, Take 1 tablet (5 mg total) by mouth 2 (two) times daily as needed for anxiety., Disp: 60 tablet, Rfl: 0;  oxyCODONE-acetaminophen (PERCOCET) 10-325 MG per tablet, Take 1 tablet by mouth 4 (four) times daily., Disp: , Rfl:  tiZANidine (ZANAFLEX) 4 MG tablet, Take 4 mg by mouth 5 (five) times daily., Disp: , Rfl: ;  traZODone (DESYREL) 100 MG tablet, Take 100 mg by mouth at bedtime. 2 tabs po qhs, Disp: , Rfl: ;  Topiramate ER (TROKENDI XR) 100 MG CP24, Take 100 mg by mouth daily., Disp: 30 capsule, Rfl: 5 Current facility-administered medications:ketorolac (TORADOL) injection 60 mg, 60 mg, Intramuscular, Once, Donita Brooks, MD;  promethazine (PHENERGAN) injection 25 mg, 25 mg, Intravenous, Once, Donita Brooks, MD History   Social History  . Marital Status: Married    Spouse Name: N/A    Number of Children: N/A  . Years of Education: N/A   Occupational History  . Not on file.   Social History Main Topics  .  Smoking status: Never Smoker   . Smokeless tobacco: Not on file  . Alcohol Use: Not on file  . Drug Use: Not on file  . Sexually Active: Not on file   Other Topics Concern  . Not on file   Social History Narrative  . No narrative on file   Allergies  Allergen Reactions  . Mobic (Meloxicam) Anaphylaxis  . Penicillins     REACTION: Hives/ swelling, throat closed up     Review of Systems  All other systems reviewed and are negative.       Objective:   Physical Exam  Constitutional: She is oriented to person, place, and time.  Cardiovascular: Normal rate, regular rhythm, normal heart sounds and intact distal pulses.   No murmur heard. Pulmonary/Chest: Effort normal and breath sounds normal. No respiratory distress. She has no wheezes. She has no rales. She exhibits no tenderness.  Abdominal: Soft. Bowel sounds are normal. She exhibits no distension and no mass. There is no tenderness. There is no rebound and no guarding.  Neurological: She is alert and oriented to person, place, and time. She has normal reflexes. She displays normal reflexes. No cranial nerve deficit. She exhibits normal muscle tone. Coordination normal.          Assessment & Plan:  1. Migraine, unspecified, without mention of intractable migraine without mention of status migrainosus At her office visit in December,  she was given an injection of Toradol 60 mg IM. She tolerated this well without allergic reaction. Therefore we are going to repeat this. Also gave her Phenergan 25 mg IM times one. Also prescribe a Trokandi XR 100 mg poqday and gave her a reading card they will blame the co-pay to $15 per month. I like her to start this for prophylaxis. - Topiramate ER (TROKENDI XR) 100 MG CP24; Take 100 mg by mouth daily.  Dispense: 30 capsule; Refill: 5 - ketorolac (TORADOL) injection 60 mg; Inject 2 mLs (60 mg total) into the muscle once. - promethazine (PHENERGAN) injection 25 mg; Inject 1 mL (25 mg total)  into the vein once.

## 2012-12-03 NOTE — Telephone Encounter (Signed)
Come now asap or go to urgent care

## 2012-12-03 NOTE — Telephone Encounter (Signed)
Pt called and states can be here in about 20 min.

## 2012-12-06 ENCOUNTER — Other Ambulatory Visit: Payer: Self-pay | Admitting: Family Medicine

## 2012-12-09 ENCOUNTER — Telehealth: Payer: Self-pay | Admitting: Family Medicine

## 2012-12-09 MED ORDER — TOPIRAMATE 50 MG PO TABS: 50.0000 mg | ORAL_TABLET | Freq: Two times a day (BID) | ORAL | Status: DC

## 2012-12-09 NOTE — Telephone Encounter (Signed)
I ordered topamax 50 bid.

## 2012-12-13 ENCOUNTER — Other Ambulatory Visit: Payer: Self-pay | Admitting: Orthopedic Surgery

## 2012-12-13 DIAGNOSIS — M549 Dorsalgia, unspecified: Secondary | ICD-10-CM

## 2012-12-24 ENCOUNTER — Ambulatory Visit
Admission: RE | Admit: 2012-12-24 | Discharge: 2012-12-24 | Disposition: A | Discharge status: Home / Self Care | Payer: Medicare Other | Source: Ambulatory Visit | Attending: Orthopedic Surgery | Admitting: Orthopedic Surgery

## 2012-12-24 VITALS — BP 110/56 | HR 64

## 2012-12-24 DIAGNOSIS — M549 Dorsalgia, unspecified: Secondary | ICD-10-CM

## 2012-12-24 MED ORDER — IOHEXOL 180 MG/ML  SOLN
15.0000 mL | Freq: Once | INTRAMUSCULAR | Status: AC | PRN
  Administered 2012-12-24: 15 mL via INTRATHECAL

## 2012-12-24 MED ORDER — HYDROMORPHONE HCL PF 2 MG/ML IJ SOLN
2.0000 mg | Freq: Once | INTRAMUSCULAR | Status: AC
  Administered 2012-12-24: 2 mg via INTRAMUSCULAR

## 2012-12-24 MED ORDER — DIAZEPAM 5 MG PO TABS
10.0000 mg | ORAL_TABLET | Freq: Once | ORAL | Status: AC
  Administered 2012-12-24: 10 mg via ORAL

## 2012-12-24 MED ORDER — ONDANSETRON HCL 4 MG/2ML IJ SOLN
4.0000 mg | Freq: Once | INTRAMUSCULAR | Status: AC
  Administered 2012-12-24: 4 mg via INTRAMUSCULAR

## 2012-12-24 NOTE — Progress Notes (Signed)
Pt was given pain meds per her request.

## 2012-12-24 NOTE — Progress Notes (Signed)
Patient states she has been off Celexa, Trazodone, Buspar and Wellbutrin since Tuesday, 12/21/12.  Discharge instructions explained to patient.  Donell Sievert, RN

## 2013-01-05 ENCOUNTER — Other Ambulatory Visit: Payer: Self-pay | Admitting: Family Medicine

## 2013-01-05 NOTE — Telephone Encounter (Signed)
?   OK to Refill  

## 2013-01-05 NOTE — Telephone Encounter (Signed)
Ok to refill 

## 2013-01-12 ENCOUNTER — Ambulatory Visit (INDEPENDENT_AMBULATORY_CARE_PROVIDER_SITE_OTHER): Payer: Medicare Other | Admitting: Neurology

## 2013-01-12 ENCOUNTER — Encounter: Payer: Self-pay | Admitting: Neurology

## 2013-01-12 VITALS — BP 101/64 | HR 98 | Ht 66.0 in | Wt 187.0 lb

## 2013-01-12 DIAGNOSIS — R413 Other amnesia: Secondary | ICD-10-CM

## 2013-01-12 DIAGNOSIS — F419 Anxiety disorder, unspecified: Secondary | ICD-10-CM

## 2013-01-12 DIAGNOSIS — F411 Generalized anxiety disorder: Secondary | ICD-10-CM | POA: Insufficient documentation

## 2013-01-12 DIAGNOSIS — IMO0001 Reserved for inherently not codable concepts without codable children: Secondary | ICD-10-CM

## 2013-01-12 DIAGNOSIS — M545 Low back pain, unspecified: Secondary | ICD-10-CM

## 2013-01-12 DIAGNOSIS — F329 Major depressive disorder, single episode, unspecified: Secondary | ICD-10-CM

## 2013-01-12 DIAGNOSIS — R251 Tremor, unspecified: Secondary | ICD-10-CM | POA: Insufficient documentation

## 2013-01-12 DIAGNOSIS — R259 Unspecified abnormal involuntary movements: Secondary | ICD-10-CM

## 2013-01-12 DIAGNOSIS — M797 Fibromyalgia: Secondary | ICD-10-CM | POA: Insufficient documentation

## 2013-01-12 DIAGNOSIS — G43909 Migraine, unspecified, not intractable, without status migrainosus: Secondary | ICD-10-CM

## 2013-01-12 MED ORDER — TOPIRAMATE 100 MG PO TABS: 50.0000 mg | ORAL_TABLET | Freq: Two times a day (BID) | ORAL | Status: DC

## 2013-01-12 NOTE — Progress Notes (Signed)
History of Present Illness   Julie Neal is a 57 years right-handed Caucasian female, accompanied by her husband referred by [facility] Dr. Wilhelmenia Blase  for evaluation of 1 episode of body shaking and  She has past medical history of depression, anxiety, chronic low back pain, chronic neck pain,   I saw her in 2012 for gradual onset memory trouble, she continued to be on polypharmacy treatment, Including diazepam 5 milligram twice a day, tizanidine 4 mg 4 times a day, Percocet 10/325 mg 4 times a day, trazodone 100 mg 2 tablets every night Celexa 20 mg every day,husband stated she spent most of her time at bed, complaining of pain,   In Early May 2014, after she put her grandson into bed, she noticed left retro-orbital area headaches, excruciating pain, then she began to have body shaking, her husband was able to help her sit at the edge of the bed, later went to sleep, she has no loss of consciousness whole episode last about 10-15 minutes, she went to night time sleep afterwords   She complains of gait difficulty, continued memory trouble, diffuse low back pain, neck pain, body aching pain,  Denied a previous history of seizure,   She has more than years history of memory trouble, getting worse . She has strong family history of Alzheimer's disease, her mother, and maternal grandmother suffered Alzheimer's disease in their 76s.  She graduated from high school, having 18 months course of college, worked at a [employer] job, Diplomatic Services operational officer for 16 years, was laid-off many years ago, has been on disability for few years now because of her chronic pain, she is also on multiple medications, including diazepam 5 mg twice a day, tizanidine 4 mg 4 times a day, Duragesic patch 75 mg every 72 hours, citalopram 40 mg twice a day, Phenergan p.r.n. She has quit driving for 5 years.  Over past 2 years, family noticed that she become forgetful, initially it was just minor things, but over the past 6 months,  getting to severe degree, she would put apple to dishwasher, also put coffee mug on the stove,leave it burned, then using barehanded to grab the burnt coffee mug,     Review of Systems  Out of a complete 14 system review, the patient complains of only the following symptoms, and all other reviewed systems are negative.   Constitutional:   N/A Cardiovascular:  N/A Ear/Nose/Throat:  N/A Skin: N/A Eyes: Blurred vision Respiratory: Snoring Gastroitestinal: N/A    Hematology/Lymphatic:  N/A Endocrine:  N/A Musculoskeletal:N/A Allergy/Immunology: N/A Neurological: Memory loss, confusion, headaches, seizure, insomnia, snoring, Psychiatric:   Depression   Physical Exam  Neck: supple no carotid bruits Respiratory: clear to auscultation bilaterally Cardiovascular: regular rate rhythm  Neurologic Exam  Mental Status: depressed looking, awake, alert, cooperative to history, talking, and casual conversation. MMSE 29 /30,she missed 1 out of 3 recalls Cranial Nerves: CN II-XII pupils were equal round reactive to light.  Fundi were sharp bilaterally.  Extraocular movements were full. Smooth pursuit was broken into small catch up saccade,   Visual fields were full on confrontational test.  Facial sensation and strength were normal.  Hearing was intact to finger rubbing bilaterally.  Uvula tongue were midline.  Head turning and shoulder shrugging were normal and symmetric.  Tongue protrusion into the cheeks strength were normal.  Motor: Normal tone, bulk, and strength. Sensory: Normal to light touch, pinprick, proprioception, and vibratory sensation. Coordination: Normal finger-to-nose, heel-to-shin.  There was no dysmetria noticed. Gait and  Station: wide based and mildly unsteady.  Romberg sign: Negative Reflexes: Deep tendon reflexes: Biceps: 2/2, Brachioradialis: 2/2, Triceps: 2/2, Pateller: 2/2, Achilles: 1/1.  Plantar responses are flexor.   Assessment and plan: 57 years old right-handed  Caucasian female, with past medical history of depression, chronic pain, migraine headaches, on polypharmacy treatment, presenting with one episode of left-sided headache, followed by body shaking, no loss of consciousness,   1, differentiation diagnosis including complicated migraine, less likely seizure, 2 complete evaluation with MRI of the brain with and without contrast, EEG 3. Increase topiramate to 100 mg twice a day, decreased tizanidine 4 mg twice a day 4 return to clinic in one to 2 months with [name].

## 2013-01-21 ENCOUNTER — Ambulatory Visit (INDEPENDENT_AMBULATORY_CARE_PROVIDER_SITE_OTHER): Payer: Medicare Other | Admitting: Radiology

## 2013-01-21 DIAGNOSIS — G43909 Migraine, unspecified, not intractable, without status migrainosus: Secondary | ICD-10-CM

## 2013-01-21 DIAGNOSIS — F329 Major depressive disorder, single episode, unspecified: Secondary | ICD-10-CM

## 2013-01-21 DIAGNOSIS — R413 Other amnesia: Secondary | ICD-10-CM

## 2013-01-21 DIAGNOSIS — R259 Unspecified abnormal involuntary movements: Secondary | ICD-10-CM

## 2013-01-21 DIAGNOSIS — R251 Tremor, unspecified: Secondary | ICD-10-CM

## 2013-01-21 DIAGNOSIS — F32A Depression, unspecified: Secondary | ICD-10-CM

## 2013-01-21 DIAGNOSIS — M797 Fibromyalgia: Secondary | ICD-10-CM

## 2013-01-21 NOTE — Procedures (Signed)
HISTORY: 57 years old female, presenting with one episode of body shaking in May 2014.  TECHNIQUE:  16 channel EEG was performed based on standard 10-16 international system. One channel was dedicated to EKG, which has demonstrates normal sinus rhythm of 60 beats per minutes.  Upon awakening, the posterior background activity was diffusely slow, dysarrhythmic veloped, in theta range, with amplitude of 37 microvoltage, reactive to eye opening and closure. was also intermittent frontal predominant dysrhythmic higher amplitude 50 microvoltage activities,  There was no evidence of epilepsy for discharge.  Photic stimulation was performed, which induced no abnormalities  Hyperventilation was  attempted without success patient could not be able to stay awake   At the end of the tracing, there was slow down of background activity, K complexes, sleep spindles indicating stage II sleep,  CONCLUSION: This is an abnormal EEG.  There is generalized background slowing, indicating bicerebral dysfunction, etiology including metabolic toxic, vs. rapid progression or advanced central nervous system degenerative disorders

## 2013-02-03 ENCOUNTER — Telehealth: Payer: Self-pay | Admitting: Family Medicine

## 2013-02-03 MED ORDER — DIAZEPAM 5 MG PO TABS: ORAL_TABLET | ORAL | Status: DC

## 2013-02-03 NOTE — Telephone Encounter (Signed)
Rx Refilled  

## 2013-02-03 NOTE — Telephone Encounter (Signed)
Ok to refill 

## 2013-02-03 NOTE — Telephone Encounter (Signed)
?   OK to Refill  

## 2013-03-05 ENCOUNTER — Telehealth: Payer: Self-pay | Admitting: Family Medicine

## 2013-03-05 MED ORDER — DIAZEPAM 5 MG PO TABS: ORAL_TABLET | ORAL | Status: DC

## 2013-03-05 NOTE — Telephone Encounter (Signed)
Rx Refilled  

## 2013-03-30 ENCOUNTER — Telehealth: Payer: Self-pay | Admitting: Family Medicine

## 2013-03-30 NOTE — Telephone Encounter (Signed)
Valium 5 mg 1 BID prn #60 last rf 03/05/13

## 2013-03-30 NOTE — Telephone Encounter (Signed)
?   OK to Refill  

## 2013-03-31 ENCOUNTER — Telehealth: Payer: Self-pay | Admitting: Family Medicine

## 2013-03-31 MED ORDER — DIAZEPAM 5 MG PO TABS: ORAL_TABLET | ORAL | Status: DC

## 2013-03-31 NOTE — Telephone Encounter (Signed)
Pt called to say that her valium was supposed to be [facility] to [facility] [address] [place]. Can we please send to correct pharmacy?

## 2013-03-31 NOTE — Telephone Encounter (Signed)
ok 

## 2013-03-31 NOTE — Telephone Encounter (Signed)
[facility]

## 2013-03-31 NOTE — Telephone Encounter (Signed)
Med c/o to correct pharmacy

## 2013-05-06 ENCOUNTER — Encounter: Payer: Self-pay | Admitting: Neurology

## 2013-05-06 ENCOUNTER — Ambulatory Visit (INDEPENDENT_AMBULATORY_CARE_PROVIDER_SITE_OTHER): Payer: Medicare Other | Admitting: Neurology

## 2013-05-06 VITALS — BP 150/95 | HR 98 | Ht 65.0 in | Wt 183.0 lb

## 2013-05-06 DIAGNOSIS — M797 Fibromyalgia: Secondary | ICD-10-CM

## 2013-05-06 DIAGNOSIS — E785 Hyperlipidemia, unspecified: Secondary | ICD-10-CM

## 2013-05-06 DIAGNOSIS — R259 Unspecified abnormal involuntary movements: Secondary | ICD-10-CM

## 2013-05-06 DIAGNOSIS — R413 Other amnesia: Secondary | ICD-10-CM

## 2013-05-06 DIAGNOSIS — Z87898 Personal history of other specified conditions: Secondary | ICD-10-CM

## 2013-05-06 DIAGNOSIS — IMO0001 Reserved for inherently not codable concepts without codable children: Secondary | ICD-10-CM

## 2013-05-06 DIAGNOSIS — G43909 Migraine, unspecified, not intractable, without status migrainosus: Secondary | ICD-10-CM

## 2013-05-06 DIAGNOSIS — R251 Tremor, unspecified: Secondary | ICD-10-CM
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
## 2013-05-10 ENCOUNTER — Other Ambulatory Visit: Payer: Self-pay

## 2013-05-10 MED ORDER — TOPIRAMATE 100 MG PO TABS: 100.0000 mg | ORAL_TABLET | Freq: Two times a day (BID) | ORAL | Status: DC

## 2013-05-10 NOTE — Telephone Encounter (Signed)
Patient called requesting the Topamax Rx be resent to [facility] [address] rather than [facility].   Last OV says: 2 Increase topiramate to 100 mg twice a day  Rx has been resent.

## 2013-05-12 ENCOUNTER — Telehealth: Payer: Self-pay | Admitting: Neurology

## 2013-05-16 NOTE — Telephone Encounter (Signed)
Called patient to get more information about her medication Topamax. She states it  was increased to a whole pill instead of a half. A whole makes her sleep a lot so she decreased it back to a half a tab. Please advise if this is ok, patient was taking the medication for migraines. Best contact  [phone]

## 2013-05-17 NOTE — Telephone Encounter (Signed)
Please call her back, it is OK to take Topamax 1/2 tab.

## 2013-05-18 NOTE — Telephone Encounter (Signed)
Call pt and left message that Dr. Terrace Arabia said that it was ok to take 1/2 tablet of the topamax.

## 2013-05-28 ENCOUNTER — Other Ambulatory Visit: Payer: Self-pay | Admitting: Family Medicine

## 2013-05-29 NOTE — Telephone Encounter (Signed)
Last Rf 03/31/13 + 1 add RF.  Last OV 12/03/12   OK refill?

## 2013-05-30 NOTE — Telephone Encounter (Signed)
ok 

## 2013-07-27 ENCOUNTER — Other Ambulatory Visit: Payer: Self-pay | Admitting: Family Medicine

## 2013-07-27 NOTE — Telephone Encounter (Signed)
.?   OK to Refill and do refills?

## 2013-07-28 NOTE — Telephone Encounter (Signed)
ok 

## 2013-08-02 ENCOUNTER — Other Ambulatory Visit: Payer: Self-pay | Admitting: Family Medicine

## 2013-08-02 NOTE — Telephone Encounter (Signed)
?   OK to Refill  

## 2013-08-02 NOTE — Telephone Encounter (Signed)
ok 

## 2013-10-31 ENCOUNTER — Other Ambulatory Visit: Payer: Self-pay | Admitting: Family Medicine

## 2013-10-31 ENCOUNTER — Telehealth: Payer: Self-pay | Admitting: Family Medicine

## 2013-10-31 NOTE — Telephone Encounter (Signed)
?   OK to Refill  

## 2013-10-31 NOTE — Telephone Encounter (Signed)
Call back number is [phone] Pt is needing a refill on her diazepam (VALIUM) 5 MG tablet

## 2013-10-31 NOTE — Telephone Encounter (Signed)
Last  RF 12/23 #60 + 1.  Last OV 12/03/12.  OK refill?

## 2013-10-31 NOTE — Telephone Encounter (Signed)
rx fax to pharmacy

## 2013-11-01 ENCOUNTER — Encounter: Payer: Self-pay | Admitting: Neurology

## 2013-11-01 ENCOUNTER — Ambulatory Visit (INDEPENDENT_AMBULATORY_CARE_PROVIDER_SITE_OTHER): Payer: Medicare Other | Admitting: Neurology

## 2013-11-01 ENCOUNTER — Encounter (INDEPENDENT_AMBULATORY_CARE_PROVIDER_SITE_OTHER): Payer: Self-pay

## 2013-11-01 VITALS — BP 96/66 | HR 74 | Ht 65.0 in | Wt 180.0 lb

## 2013-11-01 DIAGNOSIS — F411 Generalized anxiety disorder: Secondary | ICD-10-CM

## 2013-11-01 DIAGNOSIS — Z87898 Personal history of other specified conditions: Secondary | ICD-10-CM

## 2013-11-01 DIAGNOSIS — F419 Anxiety disorder, unspecified: Secondary | ICD-10-CM

## 2013-11-01 DIAGNOSIS — R413 Other amnesia: Secondary | ICD-10-CM

## 2013-11-01 MED ORDER — TOPIRAMATE 100 MG PO TABS: 100.0000 mg | ORAL_TABLET | Freq: Two times a day (BID) | ORAL | Status: DC

## 2013-11-01 MED ORDER — TOPIRAMATE 50 MG PO TABS: 100.0000 mg | ORAL_TABLET | Freq: Two times a day (BID) | ORAL | Status: DC

## 2013-11-01 NOTE — Telephone Encounter (Signed)
ok 

## 2013-11-01 NOTE — Telephone Encounter (Signed)
Was faxed to pharm 10/31/13
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
## 2013-11-03 ENCOUNTER — Ambulatory Visit: Payer: Medicare Other | Admitting: Neurology

## 2013-11-14 ENCOUNTER — Other Ambulatory Visit: Payer: Self-pay | Admitting: Specialist

## 2013-11-14 DIAGNOSIS — M719 Bursopathy, unspecified: Principal | ICD-10-CM

## 2013-11-14 DIAGNOSIS — M67919 Unspecified disorder of synovium and tendon, unspecified shoulder: Secondary | ICD-10-CM

## 2013-11-18 ENCOUNTER — Ambulatory Visit
Admission: RE | Admit: 2013-11-18 | Discharge: 2013-11-18 | Disposition: A | Discharge status: Home / Self Care | Payer: Medicare Other | Source: Ambulatory Visit | Attending: Specialist | Admitting: Specialist

## 2013-11-18 ENCOUNTER — Other Ambulatory Visit: Payer: Self-pay | Admitting: Specialist

## 2013-11-18 DIAGNOSIS — M719 Bursopathy, unspecified: Principal | ICD-10-CM

## 2013-11-18 DIAGNOSIS — M67919 Unspecified disorder of synovium and tendon, unspecified shoulder: Secondary | ICD-10-CM

## 2013-11-18 MED ORDER — IOHEXOL 300 MG/ML  SOLN
15.0000 mL | Freq: Once | INTRAMUSCULAR | Status: AC | PRN
Start: 2013-11-18 — End: 2013-11-18
  Administered 2013-11-18: 15 mL via INTRA_ARTICULAR

## 2013-12-22 ENCOUNTER — Other Ambulatory Visit: Payer: Self-pay | Admitting: Orthopedic Surgery

## 2013-12-22 DIAGNOSIS — M961 Postlaminectomy syndrome, not elsewhere classified: Secondary | ICD-10-CM

## 2013-12-28 ENCOUNTER — Telehealth: Payer: Self-pay | Admitting: Family Medicine

## 2013-12-28 NOTE — Telephone Encounter (Signed)
Requesting a refill on Diazepam - ? OK to Refill  

## 2013-12-29 ENCOUNTER — Ambulatory Visit
Admission: RE | Admit: 2013-12-29 | Discharge: 2013-12-29 | Disposition: A | Discharge status: Home / Self Care | Payer: Medicare Other | Source: Ambulatory Visit | Attending: Orthopedic Surgery | Admitting: Orthopedic Surgery

## 2013-12-29 ENCOUNTER — Telehealth: Payer: Self-pay | Admitting: Family Medicine

## 2013-12-29 VITALS — BP 115/71 | HR 61

## 2013-12-29 DIAGNOSIS — M961 Postlaminectomy syndrome, not elsewhere classified: Secondary | ICD-10-CM

## 2013-12-29 DIAGNOSIS — M545 Low back pain, unspecified: Secondary | ICD-10-CM

## 2013-12-29 MED ORDER — ONDANSETRON HCL 4 MG/2ML IJ SOLN: 4.0000 mg | Freq: Once | INTRAMUSCULAR | Status: DC

## 2013-12-29 MED ORDER — IOHEXOL 300 MG/ML  SOLN
10.0000 mL | Freq: Once | INTRAMUSCULAR | Status: AC | PRN
  Administered 2013-12-29: 10 mL via INTRATHECAL

## 2013-12-29 MED ORDER — DIAZEPAM 5 MG PO TABS
10.0000 mg | ORAL_TABLET | Freq: Once | ORAL | Status: AC
  Administered 2013-12-29: 10 mg via ORAL

## 2013-12-29 MED ORDER — MEPERIDINE HCL 100 MG/ML IJ SOLN: 100.0000 mg | Freq: Once | INTRAMUSCULAR | Status: DC

## 2013-12-29 MED ORDER — DIAZEPAM 5 MG PO TABS: ORAL_TABLET | ORAL | Status: DC

## 2013-12-29 NOTE — Telephone Encounter (Signed)
Patient returned call and made aware.

## 2013-12-29 NOTE — Telephone Encounter (Signed)
ok 

## 2013-12-29 NOTE — Telephone Encounter (Signed)
Medication called to pharmacy.  Called patient to make aware. [place].

## 2013-12-29 NOTE — Telephone Encounter (Signed)
Medication called to pharmacy on 12/29/2013.

## 2013-12-29 NOTE — Telephone Encounter (Signed)
Patient calling to see if we can refill her Lorrin Mais, her pharmacy told her to call us please call her back at [address]

## 2013-12-29 NOTE — Discharge Instructions (Signed)
Myelogram Discharge Instructions  1. Go home and rest quietly for the next 24 hours.  It is important to lie flat for the next 24 hours.  Get up only to go to the restroom.  You may lie in the bed or on a couch on your back, your stomach, your left side or your right side.  You may have one pillow under your head.  You may have pillows between your knees while you are on your side or under your knees while you are on your back.  2. DO NOT drive today.  Recline the seat as far back as it will go, while still wearing your seat belt, on the way home.  3. You may get up to go to the bathroom as needed.  You may sit up for 10 minutes to eat.  You may resume your normal diet and medications unless otherwise indicated.  Drink lots of extra fluids today and tomorrow.  4. The incidence of headache, nausea, or vomiting is about 5% (one in 20 patients).  If you develop a headache, lie flat and drink plenty of fluids until the headache goes away.  Caffeinated beverages may be helpful.  If you develop severe nausea and vomiting or a headache that does not go away with flat bed rest, call [phone].  5. You may resume normal activities after your 24 hours of bed rest is over; however, do not exert yourself strongly or do any heavy lifting tomorrow. If when you get up you have a headache when standing, go back to bed and force fluids for another 24 hours.  6. Call your physician for a follow-up appointment.  The results of your myelogram will be sent directly to your physician by the following day.  7. If you have any questions or if complications develop after you arrive home, please call [phone].  Discharge instructions have been explained to the patient.  The patient, or the person responsible for the patient, fully understands these instructions.      May resume Wellbutrin, Buspar, Celexa and Trazodone on Dec 30, 2013, after 8:30 am.

## 2013-12-30 ENCOUNTER — Telehealth: Payer: Self-pay | Admitting: Radiology

## 2013-12-30 NOTE — Telephone Encounter (Signed)
Pt called and talked to tech re: headache post myelo. Tried to return pt's call and got no answer, left message on voice mail.

## 2013-12-30 NOTE — Telephone Encounter (Signed)
Pt called about headache and was told she needed to lay down for another 24 hours and keep drinking lots of fluids and caffeine if it didn't bother her otherwise. Explained headache would go away with just bedrest and fluids.

## 2014-02-13 ENCOUNTER — Ambulatory Visit (INDEPENDENT_AMBULATORY_CARE_PROVIDER_SITE_OTHER): Payer: Medicare Other | Admitting: Psychiatry

## 2014-02-13 ENCOUNTER — Encounter (INDEPENDENT_AMBULATORY_CARE_PROVIDER_SITE_OTHER): Payer: Self-pay

## 2014-02-13 ENCOUNTER — Encounter (HOSPITAL_COMMUNITY): Payer: Self-pay | Admitting: Psychiatry

## 2014-02-13 VITALS — BP 99/65 | HR 70 | Ht 65.5 in | Wt 183.4 lb

## 2014-02-13 DIAGNOSIS — F339 Major depressive disorder, recurrent, unspecified: Secondary | ICD-10-CM

## 2014-02-13 DIAGNOSIS — F331 Major depressive disorder, recurrent, moderate: Secondary | ICD-10-CM

## 2014-02-13 MED ORDER — BUPROPION HCL ER (XL) 300 MG PO TB24: 300.0000 mg | ORAL_TABLET | Freq: Every day | ORAL | Status: DC
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
## 2014-03-04 ENCOUNTER — Other Ambulatory Visit: Payer: Self-pay | Admitting: Family Medicine

## 2014-03-04 ENCOUNTER — Other Ambulatory Visit (HOSPITAL_COMMUNITY): Payer: Self-pay | Admitting: Psychiatry

## 2014-03-06 NOTE — Telephone Encounter (Signed)
ok 

## 2014-03-06 NOTE — Telephone Encounter (Signed)
Ok to refill??  Last office visit 12/03/2012.  Last refill 12/29/2013, #1 refill.

## 2014-03-06 NOTE — Telephone Encounter (Signed)
Medication called to pharmacy. 

## 2014-03-07 ENCOUNTER — Other Ambulatory Visit (HOSPITAL_COMMUNITY): Payer: Self-pay | Admitting: Psychiatry

## 2014-03-13 ENCOUNTER — Encounter (HOSPITAL_COMMUNITY): Payer: Self-pay | Admitting: Psychiatry

## 2014-03-13 ENCOUNTER — Ambulatory Visit (INDEPENDENT_AMBULATORY_CARE_PROVIDER_SITE_OTHER): Payer: Medicare Other | Admitting: Psychiatry

## 2014-03-13 VITALS — BP 86/59 | HR 79 | Ht 65.5 in | Wt 183.4 lb

## 2014-03-13 DIAGNOSIS — Z79899 Other long term (current) drug therapy: Secondary | ICD-10-CM

## 2014-03-13 DIAGNOSIS — F331 Major depressive disorder, recurrent, moderate: Secondary | ICD-10-CM

## 2014-03-13 MED ORDER — BUPROPION HCL ER (XL) 300 MG PO TB24: 300.0000 mg | ORAL_TABLET | Freq: Every day | ORAL | Status: DC

## 2014-03-13 MED ORDER — CITALOPRAM HYDROBROMIDE 20 MG PO TABS: 20.0000 mg | ORAL_TABLET | Freq: Every day | ORAL | Status: DC

## 2014-03-13 MED ORDER — BUSPIRONE HCL 30 MG PO TABS: 30.0000 mg | ORAL_TABLET | Freq: Every day | ORAL | Status: DC

## 2014-03-13 NOTE — Progress Notes (Signed)
[facility] [phone] Progress Note   Julie Neal [id number] 58 y.o.  03/13/2014 2:20 PM  Chief Complaint:  I need my medication.    History of Present Illness:  Julie Neal came for her followup appointment.  She is 58 year old Caucasian female who was seen on July 6 as initial evaluation.  She was referred by herself because her insurance did not cover Dr. Reece Levy .  We are still awaiting records from her previous psychiatrist.  She is taking Celexa, Wellbutrin, trazodone and BuSpar.  Patient mentioned that she is fairly stable on her current medication and she does not want to change her antidepressant.  Despite taking multiple medications she is to have sometime depression, crying spells and feeling of hopelessness.  Her sleep is good.  Her appetite is okay.  She is concerned about her husband who is diagnosed with pulmonary fibrosis.  Patient mentioned her husband has multiple health issues .  Her 4 year old daughter is also living with her .  Patient endorsed financial issues .  She also endorsed multiple somatic complaints including migraine headaches, chronic pain, memory impairment .  She is taking multiple pain medications .  Patient denies any hallucinations, suicidal thoughts or homicidal thoughts.  She reported her sleep is better if she takes the trazodone.  She denies any side effects of medication.  She denies any paranoia.  She mentioned that she cannot come to see her therapist because she cannot drive and it is difficult to keep appointment for her.  Patient denies any drinking or using any illegal substances.  Her vitals are stable.  Her weight is unchanged from the past.  Suicidal Ideation: No Plan Formed: No Patient has means to carry out plan: No  Homicidal Ideation: No Plan Formed: No Patient has means to carry out plan: No  Past Psychiatric History/Hospitalization(s) Patient endorsed history of depression when her son drowned at age 1.  She was getting antidepressants  from her primary care physician and then from Dr. Reece Levy.  She endorsed history of using cocaine in her early 12s .  She did not recall her previous medications.   Anxiety: Yes Bipolar Disorder: No Depression: Yes Mania: No Psychosis: No Schizophrenia: No Personality Disorder: No Hospitalization for psychiatric illness: No History of Electroconvulsive Shock Therapy: No Prior Suicide Attempts: No  Medical History; Patient has hyperlipidemia, neurologic migraine, memory impairment, Lumbago, fibromyalgia and migraine.  She has difficulty walking because of chronic pain.  Her primary care physician is Dr Dennard Schaumann, He neurologist is Dr Krista Blue and her pain specialist is Dr. Florene Glen.  Review of Systems: Psychiatric: Agitation: No Hallucination: No Depressed Mood: Yes Insomnia: No Hypersomnia: No Altered Concentration: No Feels Worthless: No Grandiose Ideas: No Belief In Special Powers: No New/Increased Substance Abuse: No Compulsions: No  Neurologic: Headache: Yes Seizure: Patient endorsed seizure-like activity twice in the past. Paresthesias: No    Outpatient Encounter Prescriptions as of 03/13/2014  Medication Sig  . buPROPion (WELLBUTRIN XL) 300 MG 24 hr tablet Take 1 tablet (300 mg total) by mouth daily.  . busPIRone (BUSPAR) 30 MG tablet Take 1 tablet (30 mg total) by mouth daily.  . citalopram (CELEXA) 20 MG tablet Take 1 tablet (20 mg total) by mouth daily.  . diazepam (VALIUM) 5 MG tablet TAKE 1 TABLET BY MOUTH TWICE A DAY  . oxyCODONE-acetaminophen (PERCOCET) 10-325 MG per tablet Take 1 tablet by mouth 4 (four) times daily.  Marland Kitchen tiZANidine (ZANAFLEX) 4 MG tablet Take 4 mg by mouth 2 (two) times  daily.   . topiramate (TOPAMAX) 50 MG tablet Take 2 tablets (100 mg total) by mouth 2 (two) times daily.  . traZODone (DESYREL) 100 MG tablet Take 200 mg by mouth at bedtime. 2 tabs po qhs  . [DISCONTINUED] buPROPion (WELLBUTRIN XL) 300 MG 24 hr tablet Take 1 tablet (300 mg total) by mouth  daily.  . [DISCONTINUED] busPIRone (BUSPAR) 30 MG tablet 30 mg daily.  . [DISCONTINUED] citalopram (CELEXA) 20 MG tablet Take 20 mg by mouth daily.    No results found for this or any previous visit (from the past 2160 hour(s)).    Physical Exam: Constitutional:  BP 86/59  Pulse 79  Ht 5' 5.5" (1.664 m)  Wt 183 lb 6.4 oz (83.19 kg)  BMI 30.04 kg/m2  Musculoskeletal: Strength & Muscle Tone: within normal limits Gait & Station: unsteady, Needs cain to help walking Patient leans: Front  Mental Status Examination;  Patient is casually dressed and fairly groomed.  She maintains fair eye contact.  Her speech is slow but coherent.  She has difficulty organizing her thoughts.  She is a poor historian.  Her attention and concentration is fair.  She described her mood is sad and her affect is mood appropriate.  She denies any auditory or visual hallucination.  She denies any active or passive suicidal thoughts and homicidal thoughts.  There were no paranoia, delusions or any obsessive thoughts.  Her psychomotor activity is slightly decreased.  She has no tremors or shakes.  Her fund of knowledge is average.  Her thought process is slow but logical and goal-directed.  She is alert and oriented x3.  Her insight judgment and impulse control is okay.   Review of Psycho-Social Stressors (1), Review or order clinical lab tests (1), Decision to obtain old records (1), Review of Last Therapy Session (1) and Review of Medication Regimen & Side Effects (2)  Assessment: Axis I: Maj. depressive disorder, recurrent  Axis II: Deferred  Axis III:  Past Medical History  Diagnosis Date  . Hyperlipidemia   . Depression   . Neuralgic migraines   . Anxiety   . Cervicalgia   . Memory loss   . Lumbago   . Fibromyalgia   . Migraines     Axis IV: Mild to moderate   Plan:  We are still awaiting the records from Drr. Reddy's office.  Patient does not want dosage of try a new medication.  She is taking  3 antidepressant  Celexa 20 mg daily, Wellbutrin XL 300 mg daily , trazodone 200 mg at bedtime .  She is also taking BuSpar 30 mg 3 times a day.  I called the pharmacy and verify the dosage, she has a six-month supply of trazodone however she would require Celexa, Wellbutrin and BuSpar.  She is getting Valium from her primary care physician, Topamax from her neurologist and Zanaflex and Percocet from her pain specialist.  Discussed polypharmacy, medication interaction and efficacy..  Recommended counseling but patient declined at this time.  Recommended to call us back if she has any question or any concern.  We will provide a new prescription of Wellbutrin, Celexa, BuSpar 30 days with one extra refill.  Recommended to call us back if she has any question or any concern.  Followup in 2 months.  I would also do blood work including CBC, CMP, and an A1c. Time spent 25 minutes.  More than 50% of the time spent in psychoeducation, counseling and coordination of care.  Discuss safety plan  that anytime having active suicidal thoughts or homicidal thoughts then patient need to call 911 or go to the local emergency room.   ARFEEN,SYED T., MD 03/13/2014

## 2014-05-04 ENCOUNTER — Encounter: Payer: Self-pay | Admitting: Family Medicine

## 2014-05-04 ENCOUNTER — Other Ambulatory Visit: Payer: Self-pay | Admitting: Family Medicine

## 2014-05-04 ENCOUNTER — Other Ambulatory Visit (HOSPITAL_COMMUNITY): Payer: Self-pay | Admitting: Psychiatry

## 2014-05-04 ENCOUNTER — Ambulatory Visit (INDEPENDENT_AMBULATORY_CARE_PROVIDER_SITE_OTHER): Payer: Medicare Other | Admitting: Family Medicine

## 2014-05-04 VITALS — BP 124/64 | HR 68 | Temp 98.2°F | Resp 14 | Ht 67.32 in | Wt 185.0 lb

## 2014-05-04 DIAGNOSIS — J019 Acute sinusitis, unspecified: Secondary | ICD-10-CM

## 2014-05-04 MED ORDER — AZITHROMYCIN 250 MG PO TABS: ORAL_TABLET | ORAL | Status: DC

## 2014-05-04 NOTE — Telephone Encounter (Signed)
Ok to refill??  Last office visit 05/04/2014.  Last refill 03/06/2014, #1 refill.

## 2014-05-04 NOTE — Progress Notes (Signed)
Subjective:    Patient ID: Julie Neal, female    DOB: April 29, 1956, 58 y.o.   MRN: [id number]  HPI Patient presents with 4 days of right maxillary sinus pressure and pain, green purulent nasal discharge, cough productive of green purulent sputum, and subjective fevers. She is having rhinorrhea. Her daughter has the exact same symptoms. Past Medical History  Diagnosis Date  . Hyperlipidemia   . Depression   . Neuralgic migraines   . Anxiety   . Cervicalgia   . Memory loss   . Lumbago   . Fibromyalgia   . Migraines    Past Surgical History  Procedure Laterality Date  . Abdominal hysterectomy    . Back surgery    . Neck surgery    . Leg surgery Bilateral   . Hand surgery Bilateral   . Shoulder surgery Left    Current Outpatient Prescriptions on File Prior to Visit  Medication Sig Dispense Refill  . buPROPion (WELLBUTRIN XL) 300 MG 24 hr tablet Take 1 tablet (300 mg total) by mouth daily.  30 tablet  1  . busPIRone (BUSPAR) 30 MG tablet Take 1 tablet (30 mg total) by mouth daily.  30 tablet  1  . citalopram (CELEXA) 20 MG tablet Take 1 tablet (20 mg total) by mouth daily.  30 tablet  1  . diazepam (VALIUM) 5 MG tablet TAKE 1 TABLET BY MOUTH TWICE A DAY  60 tablet  1  . oxyCODONE-acetaminophen (PERCOCET) 10-325 MG per tablet Take 1 tablet by mouth 4 (four) times daily.      Marland Kitchen tiZANidine (ZANAFLEX) 4 MG tablet Take 4 mg by mouth 2 (two) times daily.       Marland Kitchen topiramate (TOPAMAX) 50 MG tablet Take 2 tablets (100 mg total) by mouth 2 (two) times daily.  60 tablet  12  . traZODone (DESYREL) 100 MG tablet Take 200 mg by mouth at bedtime. 2 tabs po qhs       No current facility-administered medications on file prior to visit.   Allergies  Allergen Reactions  . Mobic [Meloxicam] Anaphylaxis  . Penicillins Anaphylaxis  . Nubain [Nalbuphine Hcl]    History   Social History  . Marital Status: Married    Spouse Name: [name]    Number of Children: 1  . Years of Education:  college   Occupational History  .      disabled   Social History Main Topics  . Smoking status: Former Smoker    Types: Cigarettes  . Smokeless tobacco: Never Used     Comment: Quit in 1988  . Alcohol Use: No  . Drug Use: No  . Sexual Activity: Not on file   Other Topics Concern  . Not on file   Social History Narrative   Patient lives at home with her husband [name])  and has a college education. Patient has one child.   Patient is disabled. Right handed.   Caffeine one cup of coffee.      Review of Systems  All other systems reviewed and are negative.      Objective:   Physical Exam  Vitals reviewed. Constitutional: She appears well-developed and well-nourished. No distress.  HENT:  Right Ear: Tympanic membrane, external ear and ear canal normal.  Left Ear: Tympanic membrane, external ear and ear canal normal.  Nose: Mucosal edema and rhinorrhea present. Right sinus exhibits maxillary sinus tenderness.  Mouth/Throat: Oropharynx is clear and moist. No oropharyngeal exudate.  Neck: Neck supple.  Cardiovascular: Normal rate, regular rhythm and normal heart sounds.   Pulmonary/Chest: Effort normal and breath sounds normal. No respiratory distress. She has no wheezes. She has no rales.  Lymphadenopathy:    She has no cervical adenopathy.  Skin: She is not diaphoretic.          Assessment & Plan:  Acute rhinosinusitis - Plan: azithromycin (ZITHROMAX) 250 MG tablet   I believe the patient has a sinus infection. However explained to the patient believe it is most likely a virus. I recommended tincture of time for 5 days. I anticipate his symptoms will improve in 4-5 days regardless whether or not she is on antibiotics. I recommended she take Mucinex as needed for cough and chest congestion. She can also take Sudafed for nasal congestion. I did give the patient is a zpack at her request BUT I gave her strict instructions not to fill unless symptoms persist longer  than one week or she develops a high fever and severe sinus pain.

## 2014-05-05 NOTE — Telephone Encounter (Signed)
ok 

## 2014-05-11 ENCOUNTER — Other Ambulatory Visit (HOSPITAL_COMMUNITY): Payer: Self-pay | Admitting: Psychiatry

## 2014-05-12 NOTE — Telephone Encounter (Signed)
Refill request for Buspar not approved at this time. Pt has appointment Monday 05/15/14, called to make sure she has enough medication until Dr. Adele Schilder see's her at appointment. Pt states she does not need it filled at this time, will get it filled Monday.

## 2014-05-15 ENCOUNTER — Ambulatory Visit (INDEPENDENT_AMBULATORY_CARE_PROVIDER_SITE_OTHER): Payer: Medicare Other | Admitting: Psychiatry

## 2014-05-15 ENCOUNTER — Encounter (HOSPITAL_COMMUNITY): Payer: Self-pay | Admitting: Psychiatry

## 2014-05-15 VITALS — BP 86/52 | HR 65 | Ht 67.0 in | Wt 184.4 lb

## 2014-05-15 DIAGNOSIS — F339 Major depressive disorder, recurrent, unspecified: Secondary | ICD-10-CM

## 2014-05-15 DIAGNOSIS — F331 Major depressive disorder, recurrent, moderate: Secondary | ICD-10-CM

## 2014-05-15 MED ORDER — CITALOPRAM HYDROBROMIDE 20 MG PO TABS: 20.0000 mg | ORAL_TABLET | Freq: Every day | ORAL | Status: DC

## 2014-05-15 MED ORDER — BUPROPION HCL ER (XL) 300 MG PO TB24: 300.0000 mg | ORAL_TABLET | Freq: Every day | ORAL | Status: DC

## 2014-05-15 MED ORDER — BUSPIRONE HCL 15 MG PO TABS: 15.0000 mg | ORAL_TABLET | Freq: Two times a day (BID) | ORAL | Status: DC

## 2014-05-15 NOTE — Progress Notes (Signed)
Julie Neal   Julie Neal [id number] 58 y.o.  05/15/2014 10:11 AM  Chief Complaint:  Medication management and followup     History of Present Illness:  Julie Neal came for her followup appointment.  She is taking BuSpar, Celexa, Wellbutrin and trazodone at bedtime.  She was ordered blood work on her last visit the patient forgot .  She continues to have episodes of depression and crying spells.  Recently she saw her primary care physician because of infection and sinusitis.  She was given antibiotic and she finished the antibiotic.  Lately she is complaining of increased pain in her joints.  She admitted poor sleep and racing thoughts.  She saw Dr. Florene Glen who prescribed morphine sulfate however she has not started yet.  Patient is taking 3 antidepressant.  Despite taking 3 antidepressant she continues to have episodes of depression, crying spells, feelings of hopelessness and anhedonia.  However she denies any active or passive suicidal thoughts or homicidal thoughts.  She remains anxious but she believed BuSpar is helping her.  We are still awaiting records from her previous psychiatrist Dr. Reece Levy.  It is not interested in counseling because of transportation issue.. her husband has pulmonary fibrosis and other multiple health issues.  Her daughter is living with them.  Patient endorsed multiple somatic complaints including migraine headaches, chronic pain and memory impairment.  Her appetite is okay.  Her vitals are stable.  She has no tremors or shakes.  She uses a wheelchair today because she is complaining of chronic pain and afraid she will fall.  Suicidal Ideation: No Plan Formed: No Patient has means to carry out plan: No  Homicidal Ideation: No Plan Formed: No Patient has means to carry out plan: No  Past Psychiatric History/Hospitalization(s) Patient endorsed history of depression when her son drowned at age 51.  She was getting antidepressants from her  primary care physician and then from Dr. Reece Levy.  She endorsed history of using cocaine in her early 41s .  She did not recall her previous medications.   Anxiety: Yes Bipolar Disorder: No Depression: Yes Mania: No Psychosis: No Schizophrenia: No Personality Disorder: No Hospitalization for psychiatric illness: No History of Electroconvulsive Shock Therapy: No Prior Suicide Attempts: No  Medical History; Patient has hyperlipidemia, neurologic migraine, memory impairment, Lumbago, fibromyalgia and migraine.  She has difficulty walking because of chronic pain.  Her primary care physician is Dr Dennard Schaumann, He neurologist is Dr Krista Blue and her pain specialist is Dr. Florene Glen.  Review of Systems: Psychiatric: Agitation: No Hallucination: No Depressed Mood: Yes Insomnia: No Hypersomnia: No Altered Concentration: No Feels Worthless: No Grandiose Ideas: No Belief In Special Powers: No New/Increased Substance Abuse: No Compulsions: No  Neurologic: Headache: Yes Seizure: Patient endorsed seizure-like activity twice in the past. Paresthesias: No    Outpatient Encounter Prescriptions as of 05/15/2014  Medication Sig  . buPROPion (WELLBUTRIN XL) 300 MG 24 hr tablet Take 1 tablet (300 mg total) by mouth daily.  . busPIRone (BUSPAR) 15 MG tablet Take 1 tablet (15 mg total) by mouth 2 (two) times daily.  . citalopram (CELEXA) 20 MG tablet Take 1 tablet (20 mg total) by mouth daily.  . diazepam (VALIUM) 5 MG tablet TAKE 1 TABLET BY MOUTH TWICE A DAY  . oxyCODONE-acetaminophen (PERCOCET) 10-325 MG per tablet Take 1 tablet by mouth 4 (four) times daily.  Marland Kitchen tiZANidine (ZANAFLEX) 4 MG tablet Take 4 mg by mouth 2 (two) times daily.   Marland Kitchen topiramate (TOPAMAX)  50 MG tablet Take 2 tablets (100 mg total) by mouth 2 (two) times daily.  . traZODone (DESYREL) 100 MG tablet Take 200 mg by mouth at bedtime. 2 tabs po qhs  . [DISCONTINUED] azithromycin (ZITHROMAX) 250 MG tablet 2 tabs poqday1, 1 tab poqday 2-5  .  [DISCONTINUED] buPROPion (WELLBUTRIN XL) 300 MG 24 hr tablet Take 1 tablet (300 mg total) by mouth daily.  . [DISCONTINUED] busPIRone (BUSPAR) 30 MG tablet Take 1 tablet (30 mg total) by mouth daily.  . [DISCONTINUED] citalopram (CELEXA) 20 MG tablet Take 1 tablet (20 mg total) by mouth daily.    No results found for this or any previous visit (from the past 2160 hour(s)).    Physical Exam: Constitutional:  BP 86/52  Pulse 65  Ht 5\' 7"  (1.702 m)  Wt 184 lb 6.4 oz (83.643 kg)  BMI 28.87 kg/m2  Musculoskeletal: Strength & Muscle Tone: within normal limits Gait & Station: unsteady, Needs cain to help walking Patient leans: Front  Mental Status Examination;  Patient is casually dressed and fairly groomed.  She maintains fair eye contact.  Her speech is slow but coherent.  She has difficulty organizing her thoughts.  She is a poor historian.  Her attention and concentration is fair.  She described her mood is sad and her affect is mood appropriate.  She denies any auditory or visual hallucination.  She denies any active or passive suicidal thoughts and homicidal thoughts.  There were no paranoia, delusions or any obsessive thoughts.  Her psychomotor activity is slightly decreased.  She has no tremors or shakes.  Her fund of knowledge is average.  Her thought process is slow but logical and goal-directed.  She is alert and oriented x3.  Today she is using wheelchair because she is afraid she will fall as she is complaining of increased pain in her joints.  Her insight judgment and impulse control is okay.   Review of Psycho-Social Stressors (1), Review or order clinical lab tests (1), Decision to obtain old records (1), Review of Last Therapy Session (1) and Review of Medication Regimen & Side Effects (2)  Assessment: Axis I: Maj. depressive disorder, recurrent  Axis II: Deferred  Axis III:  Past Medical History  Diagnosis Date  . Hyperlipidemia   . Depression   . Neuralgic migraines    . Anxiety   . Cervicalgia   . Memory loss   . Lumbago   . Fibromyalgia   . Migraines     Axis IV: Mild to moderate   Plan:  We are still awaiting the records from Drr. Reddy's office.  Patient forgot blood work.  Reinforce blood work.  Patient continues to have residual symptoms of depression and anxiety.  I recommended to split the dose of BuSpar and try 50 mg twice a day.  Patient is already taking 3 antidepressant.  I explained if she is started to take morphine sulfate that she uses trazodone only as needed for insomnia.  She is also taking Percocet.  One more time I offer counseling but patient declined.  A new description of blood work including CBC, CMP, hemoglobin A1c and TSH as given.  She is to have refills remaining on her trazodone.  However recommended to use only as needed if she were to start morphine sulfate.  Continue Celexa, Wellbutrin present dose.  Followup in 2 months. Time spent 25 minutes.  More than 50% of the time spent in psychoeducation, counseling and coordination of care.  Discuss safety  plan that anytime having active suicidal thoughts or homicidal thoughts then patient need to call 911 or go to the local emergency room.   Leolia Vinzant T., MD 05/15/2014

## 2014-06-12 ENCOUNTER — Other Ambulatory Visit: Payer: Self-pay | Admitting: Family Medicine

## 2014-06-12 NOTE — Telephone Encounter (Signed)
Refill called in on 05/05/2014 with #1 additional refill.   Contacted to pharmacy to inquire. Refill was not filled at pharmacy. Verbalized authorization to add refill.

## 2014-06-15 ENCOUNTER — Other Ambulatory Visit: Payer: Medicare Other

## 2014-06-15 DIAGNOSIS — Z0000 Encounter for general adult medical examination without abnormal findings: Secondary | ICD-10-CM

## 2014-06-15 LAB — CBC WITH DIFFERENTIAL/PLATELET
Basophils Absolute: 0 10*3/uL (ref 0.0–0.1)
Basophils Relative: 0 % (ref 0–1)
EOS ABS: 0.1 10*3/uL (ref 0.0–0.7)
Eosinophils Relative: 2 % (ref 0–5)
HCT: 42.6 % (ref 36.0–46.0)
Hemoglobin: 14.3 g/dL (ref 12.0–15.0)
Lymphocytes Relative: 31 % (ref 12–46)
Lymphs Abs: 1.2 10*3/uL (ref 0.7–4.0)
MCH: 30.2 pg (ref 26.0–34.0)
MCHC: 33.6 g/dL (ref 30.0–36.0)
MCV: 90.1 fL (ref 78.0–100.0)
Monocytes Absolute: 0.3 10*3/uL (ref 0.1–1.0)
Monocytes Relative: 7 % (ref 3–12)
NEUTROS PCT: 60 % (ref 43–77)
Neutro Abs: 2.4 10*3/uL (ref 1.7–7.7)
PLATELETS: 186 10*3/uL (ref 150–400)
RBC: 4.73 MIL/uL (ref 3.87–5.11)
RDW: 14.4 % (ref 11.5–15.5)
WBC: 4 10*3/uL (ref 4.0–10.5)

## 2014-06-15 LAB — COMPREHENSIVE METABOLIC PANEL
ALT: 15 U/L (ref 0–35)
AST: 20 U/L (ref 0–37)
Albumin: 4.2 g/dL (ref 3.5–5.2)
Alkaline Phosphatase: 78 U/L (ref 39–117)
BILIRUBIN TOTAL: 0.3 mg/dL (ref 0.2–1.2)
BUN: 23 mg/dL (ref 6–23)
CO2: 24 mEq/L (ref 19–32)
Calcium: 9.6 mg/dL (ref 8.4–10.5)
Chloride: 106 mEq/L (ref 96–112)
Creat: 1.48 mg/dL — ABNORMAL HIGH (ref 0.50–1.10)
GLUCOSE: 103 mg/dL — AB (ref 70–99)
Potassium: 3.9 mEq/L (ref 3.5–5.3)
SODIUM: 136 meq/L (ref 135–145)
TOTAL PROTEIN: 7.3 g/dL (ref 6.0–8.3)

## 2014-06-15 LAB — TSH: TSH: 0.843 u[IU]/mL (ref 0.350–4.500)

## 2014-06-15 LAB — LIPID PANEL
Cholesterol: 226 mg/dL — ABNORMAL HIGH (ref 0–200)
HDL: 40 mg/dL (ref >39)
LDL CALC: 159 mg/dL — AB (ref 0–99)
TRIGLYCERIDES: 135 mg/dL (ref <150)
Total CHOL/HDL Ratio: 5.7 Ratio
VLDL: 27 mg/dL (ref 0–40)

## 2014-07-03 ENCOUNTER — Other Ambulatory Visit: Payer: Self-pay | Admitting: Family Medicine

## 2014-07-04 NOTE — Telephone Encounter (Signed)
ok 

## 2014-07-04 NOTE — Telephone Encounter (Signed)
Ok to refill??  Last office visit 05/04/2014.  Last refill 05/05/2014, #1 refill.

## 2014-07-04 NOTE — Telephone Encounter (Signed)
Medication called to pharmacy. 

## 2014-07-08 ENCOUNTER — Other Ambulatory Visit (HOSPITAL_COMMUNITY): Payer: Self-pay | Admitting: Psychiatry

## 2014-07-09 ENCOUNTER — Other Ambulatory Visit: Payer: Self-pay | Admitting: Psychiatry

## 2014-07-10 ENCOUNTER — Other Ambulatory Visit (HOSPITAL_COMMUNITY): Payer: Self-pay | Admitting: Psychiatry

## 2014-07-13 ENCOUNTER — Encounter: Payer: Medicare Other | Admitting: Family Medicine

## 2014-07-17 ENCOUNTER — Ambulatory Visit (INDEPENDENT_AMBULATORY_CARE_PROVIDER_SITE_OTHER): Payer: No Typology Code available for payment source | Admitting: Psychiatry

## 2014-07-17 ENCOUNTER — Encounter (HOSPITAL_COMMUNITY): Payer: Self-pay | Admitting: Psychiatry

## 2014-07-17 VITALS — BP 109/57 | HR 73 | Ht 66.0 in | Wt 175.6 lb

## 2014-07-17 DIAGNOSIS — F339 Major depressive disorder, recurrent, unspecified: Secondary | ICD-10-CM

## 2014-07-17 DIAGNOSIS — F330 Major depressive disorder, recurrent, mild: Secondary | ICD-10-CM

## 2014-07-17 MED ORDER — BUSPIRONE HCL 15 MG PO TABS: 15.0000 mg | ORAL_TABLET | Freq: Two times a day (BID) | ORAL | Status: DC

## 2014-07-17 MED ORDER — BUPROPION HCL ER (XL) 300 MG PO TB24: 300.0000 mg | ORAL_TABLET | Freq: Every day | ORAL | Status: DC

## 2014-07-17 MED ORDER — CITALOPRAM HYDROBROMIDE 20 MG PO TABS: 20.0000 mg | ORAL_TABLET | Freq: Every day | ORAL | Status: DC

## 2014-07-17 MED ORDER — TRAZODONE HCL 100 MG PO TABS: 200.0000 mg | ORAL_TABLET | Freq: Every day | ORAL | Status: DC

## 2014-07-17 NOTE — Progress Notes (Signed)
[facility] Progress Note   Julie Neal [id number] 58 y.o.  07/17/2014 10:22 AM  Chief Complaint:  I am feeling better since taking BuSpar in divided doses.     History of Present Illness:  Julie Neal came for her followup appointment.  She is compliant with the medication and overall feeling less anxious and less depressed.  She reported her energy level is good.  Recently she tried morphine however after taking 2 doses she was found to be confused and her family member suggested to stop morphine.  She is still taking Percocet.  She has noticed improvement in her depression and complaining less crying spells and depressive thoughts.  She She is is taking 3 antidepressant .  She has no tremors or shakes.  She denied any feeling of hopelessness or any anhedonia.  She denies any major panic attack.  Her sleep is good.  Her vitals are stable.  She has blood work which shows mild elevation of creatinine.  She is scheduled to see Dr. Dennard Schaumann tomorrow for physical and she will discuss more about her blood work results.  Patient denies drinking or using any illegal substances.  Suicidal Ideation: No Plan Formed: No Patient has means to carry out plan: No  Homicidal Ideation: No Plan Formed: No Patient has means to carry out plan: No  Past Psychiatric History/Hospitalization(s) Patient endorsed history of depression when her son drowned at age 18.  She was getting antidepressants from her primary care physician and then from Dr. Reece Levy.  She endorsed history of using cocaine in her early 39s .  She did not recall her previous medications.   Anxiety: Yes Bipolar Disorder: No Depression: Yes Mania: No Psychosis: No Schizophrenia: No Personality Disorder: No Hospitalization for psychiatric illness: No History of Electroconvulsive Shock Therapy: No Prior Suicide Attempts: No  Medical History; Patient has hyperlipidemia, neurologic migraine, memory impairment, Lumbago, fibromyalgia  and migraine.  She has difficulty walking because of chronic pain.  Her primary care physician is Dr Dennard Schaumann, He neurologist is Dr Krista Blue and her pain specialist is Dr. Florene Glen.  Review of Systems  Musculoskeletal: Positive for back pain.  Neurological: Positive for dizziness and headaches.       Memory impairment, difficulty walking because of chronic pain  Psychiatric/Behavioral: The patient is nervous/anxious.     Psychiatric: Agitation: No Hallucination: No Depressed Mood: Yes Insomnia: No Hypersomnia: No Altered Concentration: No Feels Worthless: No Grandiose Ideas: No Belief In Special Powers: No New/Increased Substance Abuse: No Compulsions: No  Neurologic: Headache: Yes Seizure: Patient endorsed seizure-like activity twice in the past. Paresthesias: No    Outpatient Encounter Prescriptions as of 07/17/2014  Medication Sig  . buPROPion (WELLBUTRIN XL) 300 MG 24 hr tablet Take 1 tablet (300 mg total) by mouth daily.  . busPIRone (BUSPAR) 15 MG tablet Take 1 tablet (15 mg total) by mouth 2 (two) times daily.  . citalopram (CELEXA) 20 MG tablet Take 1 tablet (20 mg total) by mouth daily.  . diazepam (VALIUM) 5 MG tablet TABLET BY MOUTH TWICE A DAY **MUST LAST 30 DAYS**  . oxyCODONE-acetaminophen (PERCOCET) 10-325 MG per tablet Take 1 tablet by mouth 4 (four) times daily.  Marland Kitchen tiZANidine (ZANAFLEX) 4 MG tablet Take 4 mg by mouth 2 (two) times daily.   Marland Kitchen topiramate (TOPAMAX) 50 MG tablet Take 2 tablets (100 mg total) by mouth 2 (two) times daily.  . traZODone (DESYREL) 100 MG tablet Take 2 tablets (200 mg total) by mouth at bedtime. 2  tabs po qhs  . [DISCONTINUED] buPROPion (WELLBUTRIN XL) 300 MG 24 hr tablet TAKE 1 TABLET EVERY DAY  . [DISCONTINUED] busPIRone (BUSPAR) 15 MG tablet TAKE 1 TABLET TWICE A DAY  . [DISCONTINUED] citalopram (CELEXA) 20 MG tablet TAKE 1 TABLET BY MOUTH DAILY  . [DISCONTINUED] traZODone (DESYREL) 100 MG tablet Take 200 mg by mouth at bedtime. 2 tabs po  qhs    Recent Results (from the past 2160 hour(s))  CBC with Differential     Status: None   Collection Time: 06/15/14  8:52 AM  Result Value Ref Range   WBC 4.0 4.0 - 10.5 K/uL   RBC 4.73 3.87 - 5.11 MIL/uL   Hemoglobin 14.3 12.0 - 15.0 g/dL   HCT 42.6 36.0 - 46.0 %   MCV 90.1 78.0 - 100.0 fL   MCH 30.2 26.0 - 34.0 pg   MCHC 33.6 30.0 - 36.0 g/dL   RDW 14.4 11.5 - 15.5 %   Platelets 186 150 - 400 K/uL   Neutrophils Relative % 60 43 - 77 %   Neutro Abs 2.4 1.7 - 7.7 K/uL   Lymphocytes Relative 31 12 - 46 %   Lymphs Abs 1.2 0.7 - 4.0 K/uL   Monocytes Relative 7 3 - 12 %   Monocytes Absolute 0.3 0.1 - 1.0 K/uL   Eosinophils Relative 2 0 - 5 %   Eosinophils Absolute 0.1 0.0 - 0.7 K/uL   Basophils Relative 0 0 - 1 %   Basophils Absolute 0.0 0.0 - 0.1 K/uL   Smear Review Criteria for review not met   Comprehensive metabolic panel     Status: Abnormal   Collection Time: 06/15/14  8:52 AM  Result Value Ref Range   Sodium 136 135 - 145 mEq/L   Potassium 3.9 3.5 - 5.3 mEq/L   Chloride 106 96 - 112 mEq/L   CO2 24 19 - 32 mEq/L   Glucose, Bld 103 (H) 70 - 99 mg/dL   BUN 23 6 - 23 mg/dL   Creat 1.48 (H) 0.50 - 1.10 mg/dL   Total Bilirubin 0.3 0.2 - 1.2 mg/dL   Alkaline Phosphatase 78 39 - 117 U/L   AST 20 0 - 37 U/L   ALT 15 0 - 35 U/L   Total Protein 7.3 6.0 - 8.3 g/dL   Albumin 4.2 3.5 - 5.2 g/dL   Calcium 9.6 8.4 - 10.5 mg/dL  Lipid panel     Status: Abnormal   Collection Time: 06/15/14  8:52 AM  Result Value Ref Range   Cholesterol 226 (H) 0 - 200 mg/dL    Comment: ATP III Classification:       < 200        mg/dL        Desirable      200 - 239     mg/dL        Borderline High      >= 240        mg/dL        High      Triglycerides 135 <150 mg/dL   HDL 40 >39 mg/dL   Total CHOL/HDL Ratio 5.7 Ratio   VLDL 27 0 - 40 mg/dL   LDL Cholesterol 159 (H) 0 - 99 mg/dL    Comment:   Total Cholesterol/HDL Ratio:CHD Risk                        Coronary Heart Disease Risk  Table  Men       Women          1/2 Average Risk              3.4        3.3              Average Risk              5.0        4.4           2X Average Risk              9.6        7.1           3X Average Risk             23.4       11.0 Use the calculated Patient Ratio above and the CHD Risk table  to determine the patient's CHD Risk. ATP III Classification (LDL):       < 100        mg/dL         Optimal      100 - 129     mg/dL         Near or Above Optimal      130 - 159     mg/dL         Borderline High      160 - 189     mg/dL         High       > 190        mg/dL         Very High     TSH     Status: None   Collection Time: 06/15/14  8:52 AM  Result Value Ref Range   TSH 0.843 0.350 - 4.500 uIU/mL      Physical Exam: Constitutional:  BP 109/57 mmHg  Pulse 73  Ht _0  (1.676 m)  Wt 175 lb 9.6 oz (79.652 kg)  BMI 28.36 kg/m2  Musculoskeletal: Strength & Muscle Tone: within normal limits Gait & Station: unsteady, Needs cain to help walking Patient leans: Front  Mental Status Examination;  Patient is casually dressed and fairly groomed.  She maintains fair eye contact.  Her speech is slow but coherent.  She has difficulty organizing her thoughts.  Her attention and concentration is fair.  She described her mood  euthymic and her affect is appropriate.  She denies any auditory or visual hallucination.  She denies any active or passive suicidal thoughts and homicidal thoughts.  There were no paranoia, delusions or any obsessive thoughts.  Her psychomotor activity is slightly decreased.  She has no tremors or shakes.  Her fund of knowledge is average.  Her thought process is slow but logical and goal-directed.  She is alert and oriented x3.  Today she is using wheelchair because she is afraid she will fall as she is complaining of increased pain in her joints.  Her insight judgment and impulse control is okay.   Established Problem,  Stable/Improving (1), Review of Psycho-Social Stressors (1), Review or order clinical lab tests (1), Decision to obtain old records (1), Review of Last Therapy Session (1) and Review of Medication Regimen & Side Effects (2)  Assessment: Axis I: Maj. depressive disorder, recurrent  Axis II: Deferred  Axis III:  Past Medical History  Diagnosis Date  . Hyperlipidemia   . Depression   . Neuralgic  migraines   . Anxiety   . Cervicalgia   . Memory loss   . Lumbago   . Fibromyalgia   . Migraines     Plan:  We have requested records from her previous psychiatrist and we are still waiting the records from Drr. Reddy's office.   patient is doing overall better.  I review her blood work and talk about mild elevation of creatinine.  She scheduled to see Dr. Dennard Schaumann tomorrow.  At this time I will continue her current psychotropic medication.  She is taking Celexa 20 mg daily, BuSpar 15 mg twice a day , trazodone 200 mg at bedtime and Wellbutrin XL 300 mg daily.  Patient is getting Valium from her primary care physician. Patient does not want counseling.  Recommended to call us back if she has any question or any concern.  I will see her again in 3 months.Time spent 25 minutes.  More than 50% of the time spent in psychoeducation, counseling and coordination of care.  Discuss safety plan that anytime having active suicidal thoughts or homicidal thoughts then patient need to call 911 or go to the local emergency room.   Sameul Tagle T., MD 07/17/2014

## 2014-07-18 ENCOUNTER — Encounter: Payer: Self-pay | Admitting: Family Medicine

## 2014-07-18 ENCOUNTER — Ambulatory Visit (INDEPENDENT_AMBULATORY_CARE_PROVIDER_SITE_OTHER): Payer: Medicare Other | Admitting: Family Medicine

## 2014-07-18 VITALS — BP 110/62 | HR 64 | Temp 98.2°F | Resp 18 | Ht 67.0 in | Wt 176.0 lb

## 2014-07-18 DIAGNOSIS — Z0000 Encounter for general adult medical examination without abnormal findings: Secondary | ICD-10-CM

## 2014-07-18 DIAGNOSIS — Z23 Encounter for immunization: Secondary | ICD-10-CM

## 2014-07-18 DIAGNOSIS — Z8742 Personal history of other diseases of the female genital tract: Secondary | ICD-10-CM

## 2014-07-18 NOTE — Addendum Note (Signed)
Addended by: Olena Mater on: 07/18/2014 03:39 PM   Modules accepted: Orders

## 2014-07-18 NOTE — Progress Notes (Signed)
Subjective:    Patient ID: Julie Neal, female    DOB: 04-14-1956, 58 y.o.   MRN: [id number]  HPI  Patient is here today for complete physical exam. Her last colonoscopy was in 2011 and is not due again until 2021. She is overdue for mammogram. The patient had a hysterectomy when she was in her 28s. However she is requesting a Pap smear today given the fact she had an abnormal Pap smear in the past and also required a colposcopy as well as conization.  Otherwise she is doing well. She is due today for a flu shot as well as a tetanus shot. Her most recent lab work as listed below: No visits with results within 1 Month(s) from this visit. Latest known visit with results is:  Lab on 06/15/2014  Component Date Value Ref Range Status  . WBC 06/15/2014 4.0  4.0 - 10.5 K/uL Final  . RBC 06/15/2014 4.73  3.87 - 5.11 MIL/uL Final  . Hemoglobin 06/15/2014 14.3  12.0 - 15.0 g/dL Final  . HCT 06/15/2014 42.6  36.0 - 46.0 % Final  . MCV 06/15/2014 90.1  78.0 - 100.0 fL Final  . MCH 06/15/2014 30.2  26.0 - 34.0 pg Final  . MCHC 06/15/2014 33.6  30.0 - 36.0 g/dL Final  . RDW 06/15/2014 14.4  11.5 - 15.5 % Final  . Platelets 06/15/2014 186  150 - 400 K/uL Final  . Neutrophils Relative % 06/15/2014 60  43 - 77 % Final  . Neutro Abs 06/15/2014 2.4  1.7 - 7.7 K/uL Final  . Lymphocytes Relative 06/15/2014 31  12 - 46 % Final  . Lymphs Abs 06/15/2014 1.2  0.7 - 4.0 K/uL Final  . Monocytes Relative 06/15/2014 7  3 - 12 % Final  . Monocytes Absolute 06/15/2014 0.3  0.1 - 1.0 K/uL Final  . Eosinophils Relative 06/15/2014 2  0 - 5 % Final  . Eosinophils Absolute 06/15/2014 0.1  0.0 - 0.7 K/uL Final  . Basophils Relative 06/15/2014 0  0 - 1 % Final  . Basophils Absolute 06/15/2014 0.0  0.0 - 0.1 K/uL Final  . Smear Review 06/15/2014 Criteria for review not met   Final  . Sodium 06/15/2014 136  135 - 145 mEq/L Final  . Potassium 06/15/2014 3.9  3.5 - 5.3 mEq/L Final  . Chloride 06/15/2014 106  96 - 112  mEq/L Final  . CO2 06/15/2014 24  19 - 32 mEq/L Final  . Glucose, Bld 06/15/2014 103* 70 - 99 mg/dL Final  . BUN 06/15/2014 23  6 - 23 mg/dL Final  . Creat 06/15/2014 1.48* 0.50 - 1.10 mg/dL Final  . Total Bilirubin 06/15/2014 0.3  0.2 - 1.2 mg/dL Final  . Alkaline Phosphatase 06/15/2014 78  39 - 117 U/L Final  . AST 06/15/2014 20  0 - 37 U/L Final  . ALT 06/15/2014 15  0 - 35 U/L Final  . Total Protein 06/15/2014 7.3  6.0 - 8.3 g/dL Final  . Albumin 06/15/2014 4.2  3.5 - 5.2 g/dL Final  . Calcium 06/15/2014 9.6  8.4 - 10.5 mg/dL Final  . Cholesterol 06/15/2014 226* 0 - 200 mg/dL Final   Comment: ATP III Classification:       < 200        mg/dL        Desirable      200 - 239     mg/dL        Borderline High      >=  240        mg/dL        High     . Triglycerides 06/15/2014 135  <150 mg/dL Final  . HDL 06/15/2014 40  >39 mg/dL Final  . Total CHOL/HDL Ratio 06/15/2014 5.7   Final  . VLDL 06/15/2014 27  0 - 40 mg/dL Final  . LDL Cholesterol 06/15/2014 159* 0 - 99 mg/dL Final   Comment:   Total Cholesterol/HDL Ratio:CHD Risk                        Coronary Heart Disease Risk Table                                        Men       Women          1/2 Average Risk              3.4        3.3              Average Risk              5.0        4.4           2X Average Risk              9.6        7.1           3X Average Risk             23.4       11.0 Use the calculated Patient Ratio above and the CHD Risk table  to determine the patient's CHD Risk. ATP III Classification (LDL):       < 100        mg/dL         Optimal      100 - 129     mg/dL         Near or Above Optimal      130 - 159     mg/dL         Borderline High      160 - 189     mg/dL         High       > 190        mg/dL         Very High     . TSH 06/15/2014 0.843  0.350 - 4.500 uIU/mL Final   Past Medical History  Diagnosis Date  . Hyperlipidemia   . Depression   . Neuralgic migraines   . Anxiety   .  Cervicalgia   . Memory loss   . Lumbago   . Fibromyalgia   . Migraines    Past Surgical History  Procedure Laterality Date  . Abdominal hysterectomy    . Back surgery    . Neck surgery    . Leg surgery Bilateral   . Hand surgery Bilateral   . Shoulder surgery Left    Current Outpatient Prescriptions on File Prior to Visit  Medication Sig Dispense Refill  . buPROPion (WELLBUTRIN XL) 300 MG 24 hr tablet Take 1 tablet (300 mg total) by mouth daily. 30 tablet 2  . busPIRone (BUSPAR) 15 MG tablet Take 1 tablet (15 mg total) by mouth 2 (two) times daily. 60 tablet  2  . citalopram (CELEXA) 20 MG tablet Take 1 tablet (20 mg total) by mouth daily. 30 tablet 2  . diazepam (VALIUM) 5 MG tablet TABLET BY MOUTH TWICE A DAY **MUST LAST 30 DAYS** 60 tablet 0  . oxyCODONE-acetaminophen (PERCOCET) 10-325 MG per tablet Take 1 tablet by mouth 4 (four) times daily.    Marland Kitchen tiZANidine (ZANAFLEX) 4 MG tablet Take 4 mg by mouth every 6 (six) hours as needed.     . topiramate (TOPAMAX) 50 MG tablet Take 2 tablets (100 mg total) by mouth 2 (two) times daily. 60 tablet 12  . traZODone (DESYREL) 100 MG tablet Take 2 tablets (200 mg total) by mouth at bedtime. 2 tabs po qhs 60 tablet 2   No current facility-administered medications on file prior to visit.   Allergies  Allergen Reactions  . Mobic [Meloxicam] Anaphylaxis  . Penicillins Anaphylaxis  . Nubain [Nalbuphine Hcl]    History   Social History  . Marital Status: Married    Spouse Name: [name]    Number of Children: 1  . Years of Education: college   Occupational History  .      disabled   Social History Main Topics  . Smoking status: Former Smoker    Types: Cigarettes  . Smokeless tobacco: Never Used     Comment: Quit in 1988  . Alcohol Use: No  . Drug Use: No  . Sexual Activity: Not on file   Other Topics Concern  . Not on file   Social History Narrative   Patient lives at home with her husband [name])  and has a college  education. Patient has one child.   Patient is disabled. Right handed.   Caffeine one cup of coffee.   Family History  Problem Relation Age of Onset  . Heart attack Father   . Heart disease Father 23  . Alzheimer's disease Mother   . Stroke Mother   . Breast cancer Sister   . Diabetes type II Brother      Review of Systems  All other systems reviewed and are negative.      Objective:   Physical Exam  Constitutional: She is oriented to person, place, and time. She appears well-developed and well-nourished. No distress.  HENT:  Head: Normocephalic and atraumatic.  Right Ear: External ear normal.  Left Ear: External ear normal.  Nose: Nose normal.  Mouth/Throat: Oropharynx is clear and moist. No oropharyngeal exudate.  Eyes: Conjunctivae and EOM are normal. Pupils are equal, round, and reactive to light. No scleral icterus.  Neck: Normal range of motion. Neck supple. No JVD present. No tracheal deviation present. No thyromegaly present.  Cardiovascular: Normal rate, regular rhythm, normal heart sounds and intact distal pulses.  Exam reveals no gallop and no friction rub.   No murmur heard. Pulmonary/Chest: Effort normal and breath sounds normal. No stridor. No respiratory distress. She has no wheezes. She has no rales. She exhibits no tenderness.  Abdominal: Soft. Bowel sounds are normal. She exhibits no distension and no mass. There is no tenderness. There is no rebound and no guarding.  Genitourinary: Vagina normal. No vaginal discharge found.  Musculoskeletal: She exhibits no edema.       Cervical back: She exhibits decreased range of motion and tenderness.       Thoracic back: She exhibits decreased range of motion and tenderness.       Lumbar back: She exhibits decreased range of motion, tenderness and pain.  Lymphadenopathy:  She has no cervical adenopathy.  Neurological: She is alert and oriented to person, place, and time. She has normal reflexes. She displays normal  reflexes. No cranial nerve deficit. She exhibits normal muscle tone. Coordination normal.  Skin: Skin is warm. No rash noted. She is not diaphoretic. No erythema. No pallor.  Psychiatric: She has a normal mood and affect. Her behavior is normal. Judgment and thought content normal.  Vitals reviewed.         Assessment & Plan:   Patient's lab work is significant for an LDL cholesterol of 159. Patient's goal LDL cholesterol is less than 130. Recommended 2000 mg official a day. Also recommended therapeutic lifestyle changes including a low saturated fat diet and trying to increase her exercise as much as possible. I would like to recheck her cholesterol in 3 months. If still elevated at that time I would proceed with a statin. I'm also concerned about her kidney function. I would like to try her renal function in 3 months. Her creatinine worsens at that time, I would proceed with an ultrasound of the kidneys as well as a urinalysis, and  SPEP UPEP.  I'll schedule the patient for mammogram. Her Pap smear was sent to pathology. Patient received her flu shot as well as her tetanus shot.

## 2014-07-19 ENCOUNTER — Encounter: Payer: Self-pay | Admitting: *Deleted

## 2014-07-20 LAB — PAP IG (IMAGE GUIDED)

## 2014-07-21 ENCOUNTER — Encounter: Payer: Self-pay | Admitting: *Deleted

## 2014-07-25 ENCOUNTER — Other Ambulatory Visit: Payer: Self-pay | Admitting: Family Medicine

## 2014-07-25 DIAGNOSIS — Z1231 Encounter for screening mammogram for malignant neoplasm of breast: Secondary | ICD-10-CM

## 2014-08-05 ENCOUNTER — Other Ambulatory Visit: Payer: Self-pay | Admitting: Family Medicine

## 2014-08-06 NOTE — Telephone Encounter (Signed)
Ok to refill??  Last office visit 07/18/2014.  Last refill 07/04/2014.

## 2014-08-07 NOTE — Telephone Encounter (Signed)
RX called in .

## 2014-08-07 NOTE — Telephone Encounter (Signed)
ok 

## 2014-08-09 ENCOUNTER — Other Ambulatory Visit (HOSPITAL_COMMUNITY): Payer: Self-pay | Admitting: Psychiatry

## 2014-08-10 ENCOUNTER — Ambulatory Visit
Admission: RE | Admit: 2014-08-10 | Discharge: 2014-08-10 | Disposition: A | Discharge status: Home / Self Care | Payer: Medicare Other | Source: Ambulatory Visit | Attending: Family Medicine | Admitting: Family Medicine

## 2014-08-10 DIAGNOSIS — Z1231 Encounter for screening mammogram for malignant neoplasm of breast: Secondary | ICD-10-CM

## 2014-08-10 DIAGNOSIS — S82842A Displaced bimalleolar fracture of left lower leg, initial encounter for closed fracture: Secondary | ICD-10-CM | POA: Insufficient documentation

## 2014-08-10 HISTORY — DX: Displaced bimalleolar fracture of left lower leg, initial encounter for closed fracture: S82.842A

## 2014-08-19 ENCOUNTER — Encounter (HOSPITAL_COMMUNITY): Payer: Self-pay | Admitting: *Deleted

## 2014-08-19 ENCOUNTER — Emergency Department (HOSPITAL_COMMUNITY): Payer: PPO

## 2014-08-19 DIAGNOSIS — Z87891 Personal history of nicotine dependence: Secondary | ICD-10-CM | POA: Insufficient documentation

## 2014-08-19 DIAGNOSIS — W06XXXA Fall from bed, initial encounter: Secondary | ICD-10-CM | POA: Insufficient documentation

## 2014-08-19 DIAGNOSIS — Z79899 Other long term (current) drug therapy: Secondary | ICD-10-CM | POA: Diagnosis not present

## 2014-08-19 DIAGNOSIS — S99922A Unspecified injury of left foot, initial encounter: Secondary | ICD-10-CM | POA: Diagnosis present

## 2014-08-19 DIAGNOSIS — F419 Anxiety disorder, unspecified: Secondary | ICD-10-CM | POA: Insufficient documentation

## 2014-08-19 DIAGNOSIS — S93602A Unspecified sprain of left foot, initial encounter: Secondary | ICD-10-CM | POA: Diagnosis not present

## 2014-08-19 DIAGNOSIS — Z880 Allergy status to penicillin: Secondary | ICD-10-CM | POA: Insufficient documentation

## 2014-08-19 DIAGNOSIS — Y998 Other external cause status: Secondary | ICD-10-CM | POA: Insufficient documentation

## 2014-08-19 DIAGNOSIS — Y9389 Activity, other specified: Secondary | ICD-10-CM | POA: Insufficient documentation

## 2014-08-19 DIAGNOSIS — F329 Major depressive disorder, single episode, unspecified: Secondary | ICD-10-CM | POA: Insufficient documentation

## 2014-08-19 DIAGNOSIS — Y9289 Other specified places as the place of occurrence of the external cause: Secondary | ICD-10-CM | POA: Diagnosis not present

## 2014-08-19 DIAGNOSIS — G44009 Cluster headache syndrome, unspecified, not intractable: Secondary | ICD-10-CM | POA: Diagnosis not present

## 2014-08-19 DIAGNOSIS — S9032XA Contusion of left foot, initial encounter: Secondary | ICD-10-CM | POA: Insufficient documentation

## 2014-08-19 DIAGNOSIS — Z8639 Personal history of other endocrine, nutritional and metabolic disease: Secondary | ICD-10-CM | POA: Diagnosis not present

## 2014-08-19 DIAGNOSIS — W541XXA Struck by dog, initial encounter: Secondary | ICD-10-CM | POA: Insufficient documentation

## 2014-08-19 DIAGNOSIS — M797 Fibromyalgia: Secondary | ICD-10-CM | POA: Diagnosis not present

## 2014-08-19 NOTE — ED Notes (Signed)
Pt states she fell off the bed and landed on her left foot; pt has swelling and bruising to left foot; pt has faint pedal pulse

## 2014-08-20 ENCOUNTER — Emergency Department (HOSPITAL_COMMUNITY)
Admission: EM | Admit: 2014-08-20 | Discharge: 2014-08-20 | Disposition: A | Discharge status: Home / Self Care | Payer: PPO | Attending: Emergency Medicine | Admitting: Emergency Medicine

## 2014-08-20 DIAGNOSIS — T1490XA Injury, unspecified, initial encounter: Secondary | ICD-10-CM

## 2014-08-20 DIAGNOSIS — S93602A Unspecified sprain of left foot, initial encounter: Secondary | ICD-10-CM

## 2014-08-20 DIAGNOSIS — S99929A Unspecified injury of unspecified foot, initial encounter: Secondary | ICD-10-CM

## 2014-08-20 NOTE — ED Provider Notes (Signed)
CSN: [id number]     Arrival date & time 08/19/14  2217 History   First MD Initiated Contact with Patient 08/20/14 0101     Chief Complaint  Patient presents with  . Foot Pain     (Consider location/radiation/quality/duration/timing/severity/associated sxs/prior Treatment) HPI Comments: Patient tripped over her dogs and injured her left foot. She has swelling and bruising to the top of the foot and is having pain with walking and palpation.  Patient is a 59 y.o. female presenting with lower extremity pain. The history is provided by the patient.  Foot Pain This is a new problem. The current episode started 3 to 5 hours ago. The problem occurs constantly. The problem has been rapidly worsening. The symptoms are aggravated by walking. Nothing relieves the symptoms. She has tried nothing for the symptoms. The treatment provided no relief.    Past Medical History  Diagnosis Date  . Hyperlipidemia   . Depression   . Neuralgic migraines   . Anxiety   . Cervicalgia   . Memory loss   . Lumbago   . Fibromyalgia   . Migraines    Past Surgical History  Procedure Laterality Date  . Abdominal hysterectomy    . Back surgery    . Neck surgery    . Leg surgery Bilateral   . Hand surgery Bilateral   . Shoulder surgery Left    Family History  Problem Relation Age of Onset  . Heart attack Father   . Heart disease Father 77  . Alzheimer's disease Mother   . Stroke Mother   . Breast cancer Sister   . Diabetes type II Brother    History  Substance Use Topics  . Smoking status: Former Smoker    Types: Cigarettes  . Smokeless tobacco: Never Used     Comment: Quit in 1988  . Alcohol Use: No   OB History    No data available     Review of Systems  All other systems reviewed and are negative.     Allergies  Mobic; Penicillins; and Nubain  Home Medications   Prior to Admission medications   Medication Sig Start Date End Date Taking? Authorizing Provider  buPROPion  (WELLBUTRIN XL) 300 MG 24 hr tablet Take 1 tablet (300 mg total) by mouth daily. 07/17/14   Kathlee Nations, MD  busPIRone (BUSPAR) 15 MG tablet Take 1 tablet (15 mg total) by mouth 2 (two) times daily. 07/17/14   Kathlee Nations, MD  citalopram (CELEXA) 20 MG tablet Take 1 tablet (20 mg total) by mouth daily. 07/17/14   Kathlee Nations, MD  diazepam (VALIUM) 5 MG tablet Take 1 tablet (5 mg total) by mouth 2 (two) times daily as needed for anxiety. 08/07/14   Susy Frizzle, MD  oxyCODONE-acetaminophen (PERCOCET) 10-325 MG per tablet Take 1 tablet by mouth 4 (four) times daily.    Historical Provider, MD  tiZANidine (ZANAFLEX) 4 MG tablet Take 4 mg by mouth every 6 (six) hours as needed.     Historical Provider, MD  topiramate (TOPAMAX) 50 MG tablet Take 2 tablets (100 mg total) by mouth 2 (two) times daily. 11/01/13   Marcial Pacas, MD  traZODone (DESYREL) 100 MG tablet Take 2 tablets (200 mg total) by mouth at bedtime. 2 tabs po qhs 07/17/14   Kathlee Nations, MD   BP 90/72 mmHg  Pulse 62  Temp(Src) 97.6 F (36.4 C) (Oral)  Resp 20  Ht 5\' 7"  (1.702 m)  Wt  175 lb (79.379 kg)  BMI 27.40 kg/m2  SpO2 100% Physical Exam  Constitutional: She is oriented to person, place, and time. She appears well-developed and well-nourished. No distress.  HENT:  Head: Normocephalic and atraumatic.  Neck: Normal range of motion. Neck supple.  Musculoskeletal:  There is marketed swelling and ecchymosis to the dorsum of the left foot. There is tenderness to palpation in this area. Distal capillary refill is brisk and motor and sensation are intact to all toes.  Neurological: She is alert and oriented to person, place, and time.  Skin: Skin is warm and dry. She is not diaphoretic.  Nursing note and vitals reviewed.   ED Course  Procedures (including critical care time) Labs Review Labs Reviewed - No data to display  Imaging Review Dg Foot Complete Left  08/19/2014   CLINICAL DATA:  Pain, swelling, and bruising of the  left foot. Trip and fall injury earlier today.  EXAM: LEFT FOOT - COMPLETE 3+ VIEW  COMPARISON:  Left ankle 01/04/2006.  FINDINGS: Dorsal soft tissue swelling over the metatarsal region. No evidence of acute fracture or subluxation. No focal bone lesion or bone destruction. Bone cortex and trabecular architecture appear intact. No radiopaque soft tissue foreign bodies.  IMPRESSION: Soft tissue swelling.  No acute bony abnormalities demonstrated.   Electronically Signed   By: Lucienne Capers M.D.   On: 08/19/2014 22:57     EKG Interpretation None      MDM   Final diagnoses:  Foot injury  Foot injury  Injury    Patient with significant swelling to the dorsum of the foot after a fall. Her x-rays do not reveal a fracture. She will be treated with rest, ice, Ace bandage, elevation, and when necessary follow-up.    Veryl Speak, MD 08/20/14 [phone]

## 2014-08-20 NOTE — Discharge Instructions (Signed)
Wear Ace bandage for compression.  Keep your foot elevated and iced for 20 minutes every 2 hours while awake for the next 2 days.  Follow-up with your primary Dr. if not improving in the next week.   Foot Sprain The muscles and cord like structures which attach muscle to bone (tendons) that surround the feet are made up of units. A foot sprain can occur at the weakest spot in any of these units. This condition is most often caused by injury to or overuse of the foot, as from playing contact sports, or aggravating a previous injury, or from poor conditioning, or obesity. SYMPTOMS  Pain with movement of the foot.  Tenderness and swelling at the injury site.  Loss of strength is present in moderate or severe sprains. THE THREE GRADES OR SEVERITY OF FOOT SPRAIN ARE:  Mild (Grade I): Slightly pulled muscle without tearing of muscle or tendon fibers or loss of strength.  Moderate (Grade II): Tearing of fibers in a muscle, tendon, or at the attachment to bone, with small decrease in strength.  Severe (Grade III): Rupture of the muscle-tendon-bone attachment, with separation of fibers. Severe sprain requires surgical repair. Often repeating (chronic) sprains are caused by overuse. Sudden (acute) sprains are caused by direct injury or over-use. DIAGNOSIS  Diagnosis of this condition is usually by your own observation. If problems continue, a caregiver may be required for further evaluation and treatment. X-rays may be required to make sure there are not breaks in the bones (fractures) present. Continued problems may require physical therapy for treatment. PREVENTION  Use strength and conditioning exercises appropriate for your sport.  Warm up properly prior to working out.  Use athletic shoes that are made for the sport you are participating in.  Allow adequate time for healing. Early return to activities makes repeat injury more likely, and can lead to an unstable arthritic foot that can  result in prolonged disability. Mild sprains generally heal in 3 to 10 days, with moderate and severe sprains taking 2 to 10 weeks. Your caregiver can help you determine the proper time required for healing. HOME CARE INSTRUCTIONS   Apply ice to the injury for 15-20 minutes, 03-04 times per day. Put the ice in a plastic bag and place a towel between the bag of ice and your skin.  An elastic wrap (like an Ace bandage) may be used to keep swelling down.  Keep foot above the level of the heart, or at least raised on a footstool, when swelling and pain are present.  Try to avoid use other than gentle range of motion while the foot is painful. Do not resume use until instructed by your caregiver. Then begin use gradually, not increasing use to the point of pain. If pain does develop, decrease use and continue the above measures, gradually increasing activities that do not cause discomfort, until you gradually achieve normal use.  Use crutches if and as instructed, and for the length of time instructed.  Keep injured foot and ankle wrapped between treatments.  Massage foot and ankle for comfort and to keep swelling down. Massage from the toes up towards the knee.  Only take over-the-counter or prescription medicines for pain, discomfort, or fever as directed by your caregiver. SEEK IMMEDIATE MEDICAL CARE IF:   Your pain and swelling increase, or pain is not controlled with medications.  You have loss of feeling in your foot or your foot turns cold or blue.  You develop new, unexplained symptoms, or an  increase of the symptoms that brought you to your caregiver. MAKE SURE YOU:   Understand these instructions.  Will watch your condition.  Will get help right away if you are not doing well or get worse. Document Released: 01/17/2002 Document Revised: 10/20/2011 Document Reviewed: 03/16/2008 [facility] Patient Information 2015 [place], [place]. This information is not intended to replace advice  given to you by your health care provider. Make sure you discuss any questions you have with your health care provider.

## 2014-08-20 NOTE — ED Notes (Signed)
Pt states she fell on her foot at about 1700 yesterday. Pt states her dogs made her fall.

## 2014-08-20 NOTE — ED Notes (Signed)
Pt alert & oriented x4. Patient  given discharge instructions, paperwork & prescription(s). Patient verbalized understanding. Pt left in wheelchair department w/ no further questions.

## 2014-09-05 ENCOUNTER — Other Ambulatory Visit: Payer: Self-pay | Admitting: Family Medicine

## 2014-09-05 ENCOUNTER — Other Ambulatory Visit (HOSPITAL_COMMUNITY): Payer: Self-pay | Admitting: Psychiatry

## 2014-09-05 NOTE — Telephone Encounter (Signed)
rx called in

## 2014-09-05 NOTE — Telephone Encounter (Signed)
ok 

## 2014-09-05 NOTE — Telephone Encounter (Signed)
LRF 12/28 #60  LOV 07/18/14  Ok refill?

## 2014-09-06 ENCOUNTER — Other Ambulatory Visit: Payer: Self-pay | Admitting: Sports Medicine

## 2014-09-06 DIAGNOSIS — M25572 Pain in left ankle and joints of left foot: Secondary | ICD-10-CM

## 2014-09-08 ENCOUNTER — Ambulatory Visit
Admission: RE | Admit: 2014-09-08 | Discharge: 2014-09-08 | Disposition: A | Discharge status: Home / Self Care | Payer: PPO | Source: Ambulatory Visit | Attending: Sports Medicine | Admitting: Sports Medicine

## 2014-09-08 DIAGNOSIS — M25572 Pain in left ankle and joints of left foot: Secondary | ICD-10-CM

## 2014-10-03 ENCOUNTER — Other Ambulatory Visit: Payer: Self-pay | Admitting: Neurology

## 2014-10-03 ENCOUNTER — Other Ambulatory Visit (HOSPITAL_COMMUNITY): Payer: Self-pay | Admitting: Psychiatry

## 2014-10-11 ENCOUNTER — Other Ambulatory Visit: Payer: Self-pay | Admitting: Family Medicine

## 2014-10-11 NOTE — Telephone Encounter (Signed)
ok 

## 2014-10-11 NOTE — Telephone Encounter (Signed)
LRF 09/05/14  #60  LOV 07/18/14  OK refill?

## 2014-10-13 NOTE — Telephone Encounter (Signed)
rx called in

## 2014-10-16 ENCOUNTER — Ambulatory Visit (INDEPENDENT_AMBULATORY_CARE_PROVIDER_SITE_OTHER): Payer: PPO | Admitting: Psychiatry

## 2014-10-16 ENCOUNTER — Encounter (HOSPITAL_COMMUNITY): Payer: Self-pay | Admitting: Psychiatry

## 2014-10-16 VITALS — BP 100/67 | HR 67 | Ht 66.0 in | Wt 165.8 lb

## 2014-10-16 DIAGNOSIS — F339 Major depressive disorder, recurrent, unspecified: Secondary | ICD-10-CM

## 2014-10-16 DIAGNOSIS — F330 Major depressive disorder, recurrent, mild: Secondary | ICD-10-CM

## 2014-10-16 MED ORDER — CITALOPRAM HYDROBROMIDE 20 MG PO TABS: 20.0000 mg | ORAL_TABLET | Freq: Every day | ORAL | Status: DC

## 2014-10-16 MED ORDER — TRAZODONE HCL 100 MG PO TABS: 200.0000 mg | ORAL_TABLET | Freq: Every day | ORAL | Status: DC

## 2014-10-16 NOTE — Progress Notes (Signed)
[facility] Progress Note   Julie Neal [id number] 59 y.o.  10/16/2014 3:09 PM  Chief Complaint:  Medication management and follow-up.       History of Present Illness:  Julie Neal came for her followup appointment.  She is taking her medication as prescribed.  She denies any side effects.  In January she fell and broke her left ankle and foot.  She is wearing cast .  She is not happy because she has difficulty walking around but hoping to come off from cast next month.  She sleeping better.  She denies any agitation, anger, feeling of hopelessness or worthlessness.  Her depression is under control.  She denies any panic attack.  Some my she has difficulty sleeping because her husband does not sleep very well.  Patient denies any paranoia or any hallucination.  She wants to continue her current psychotropic medication.  Patient lives with her husband.  Her appetite is okay.  Her vitals are stable.  Suicidal Ideation: No Plan Formed: No Patient has means to carry out plan: No  Homicidal Ideation: No Plan Formed: No Patient has means to carry out plan: No  Past Psychiatric History/Hospitalization(s) Patient endorsed history of depression when her son drowned at age 8.  She was getting antidepressants from her primary care physician and then from Dr. Reece Levy.  She endorsed history of using cocaine in her early 53s .  She did not recall her previous medications.   Anxiety: Yes Bipolar Disorder: No Depression: Yes Mania: No Psychosis: No Schizophrenia: No Personality Disorder: No Hospitalization for psychiatric illness: No History of Electroconvulsive Shock Therapy: No Prior Suicide Attempts: No  Medical History; Patient has hyperlipidemia, neurologic migraine, memory impairment, Lumbago, fibromyalgia and migraine.  She has difficulty walking because of chronic pain.  Her primary care physician is Dr Dennard Schaumann, He neurologist is Dr Krista Blue and her pain specialist is Dr.  Florene Glen.  ROS  Psychiatric: Agitation: No Hallucination: No Depressed Mood: No Insomnia: No Hypersomnia: No Altered Concentration: No Feels Worthless: No Grandiose Ideas: No Belief In Special Powers: No New/Increased Substance Abuse: No Compulsions: No  Neurologic: Headache: Yes Seizure: Patient endorsed seizure-like activity twice in the past. Paresthesias: No    Outpatient Encounter Prescriptions as of 10/16/2014  Medication Sig  . buPROPion (WELLBUTRIN XL) 300 MG 24 hr tablet TAKE 1 TABLET BY MOUTH DAILY  . busPIRone (BUSPAR) 15 MG tablet TAKE 1 TABLET BY MOUTH TWICE A DAY  . citalopram (CELEXA) 20 MG tablet Take 1 tablet (20 mg total) by mouth daily.  . diazepam (VALIUM) 5 MG tablet TAKE 1 TABLET TWICE A DAY AS NEEDED  . oxyCODONE-acetaminophen (PERCOCET) 10-325 MG per tablet Take 1 tablet by mouth 4 (four) times daily.  Marland Kitchen tiZANidine (ZANAFLEX) 4 MG tablet Take 4 mg by mouth every 6 (six) hours as needed.   . topiramate (TOPAMAX) 50 MG tablet TAKE 2 TABLETS BY MOUTH 2 TIMES DAILY.  . traZODone (DESYREL) 100 MG tablet Take 2 tablets (200 mg total) by mouth at bedtime. 2 tabs po qhs  . [DISCONTINUED] citalopram (CELEXA) 20 MG tablet Take 1 tablet (20 mg total) by mouth daily.  . [DISCONTINUED] traZODone (DESYREL) 100 MG tablet Take 2 tablets (200 mg total) by mouth at bedtime. 2 tabs po qhs    Recent Results (from the past 2160 hour(s))  Pap IG (Image Guided) [facility]     Status: None   Collection Time: 07/18/14  4:09 PM  Result Value Ref Range  Specimen adequacy: SEE NOTE     Comment: SATISFACTORY.   FINAL DIAGNOSIS: SEE NOTE     Comment: - NEGATIVE FOR INTRAEPITHELIAL LESIONS OR MALIGNANCY.    COMMENTS: SEE NOTE     Comment: Atrophy is present. Patient history is noted.    Cytotechnologist: SEE NOTE     Comment: LDL, MS CT(ASCP)   QC Cytotechnologist: SEE NOTE     Comment: KS, CT(HEW) * The Pap smear is a screening test used to detect cervical cancer and its  precursors.  It should not be used as the sole means to detect cervical cancer.  Test results should be correlated with clinical findings.  The Pap test is unreliable for detecting endometrial lesions and should not be used to evaluate endometrial abnormalities. Data indicate the Pap test is subject to false negative and false positive results.  Therefore, periodic repeat testing and follow-up of any unexplained clinical signs and symptoms are recommended.   Dr. Yisroel Ramming, Cytology Medical Director   A negative PAP Letter will be sent to the patient.       Physical Exam: Constitutional:  BP 100/67 mmHg  Pulse 67  Ht 5\' 6"  (1.676 m)  Wt 165 lb 12.8 oz (75.206 kg)  BMI 26.77 kg/m2  Musculoskeletal: Strength & Muscle Tone: within normal limits Gait & Station: She is using wheelchair because of her left ankle cast Patient leans: Front and Backward  Mental Status Examination;  Patient is casually dressed and fairly groomed.  She is using wheelchair .  She maintained good eye contact.  Her speech is slow but coherent.  Her thought processes slow but logical and goal-directed.  Her attention and concentration is fair.  She described her mood  euthymic and her affect is appropriate.  She denies any auditory or visual hallucination.  She denies any active or passive suicidal thoughts and homicidal thoughts.  There were no paranoia, delusions or any obsessive thoughts.  She has no tremors or shakes.  Her fund of knowledge is average.  Her thought process is slow but logical and goal-directed.  She is alert and oriented x3.  Her insight judgment and impulse control is okay.   Established Problem, Stable/Improving (1), Review of Psycho-Social Stressors (1), Review of Last Therapy Session (1) and Review of Medication Regimen & Side Effects (2)  Assessment: Axis I: Maj. depressive disorder, recurrent  Axis II: Deferred  Axis III:  Past Medical History  Diagnosis Date  .  Hyperlipidemia   . Depression   . Neuralgic migraines   . Anxiety   . Cervicalgia   . Memory loss   . Lumbago   . Fibromyalgia   . Migraines   . Fracture of ankle, bimalleolar, left, closed 08/10/2014    Plan:  Patient is a stable on her current medication.  At this time I will continue her current psychotropic medication.  She is taking Celexa 20 mg daily, BuSpar 15 mg twice a day , trazodone 200 mg at bedtime and Wellbutrin XL 300 mg daily.  Patient is getting Valium and Topamax from her primary care physician. Patient does not want counseling.  Recommended to call us back if she has any question or any concern.  I will see her again in 3 months.   Don Giarrusso T., MD 10/16/2014

## 2014-10-19 ENCOUNTER — Ambulatory Visit: Payer: Medicare Other | Admitting: Family Medicine

## 2014-10-19 ENCOUNTER — Other Ambulatory Visit: Payer: Self-pay | Admitting: Family Medicine

## 2014-10-19 DIAGNOSIS — R7989 Other specified abnormal findings of blood chemistry: Secondary | ICD-10-CM

## 2014-10-19 DIAGNOSIS — E785 Hyperlipidemia, unspecified: Secondary | ICD-10-CM

## 2014-10-19 DIAGNOSIS — R945 Abnormal results of liver function studies: Secondary | ICD-10-CM

## 2014-10-20 ENCOUNTER — Other Ambulatory Visit: Payer: PPO

## 2014-10-20 DIAGNOSIS — R7989 Other specified abnormal findings of blood chemistry: Secondary | ICD-10-CM

## 2014-10-20 DIAGNOSIS — R945 Abnormal results of liver function studies: Secondary | ICD-10-CM

## 2014-10-20 DIAGNOSIS — E785 Hyperlipidemia, unspecified: Secondary | ICD-10-CM

## 2014-10-20 LAB — COMPREHENSIVE METABOLIC PANEL
ALT: <8 U/L (ref 0–35)
AST: 12 U/L (ref 0–37)
Albumin: 4 g/dL (ref 3.5–5.2)
Alkaline Phosphatase: 73 U/L (ref 39–117)
BILIRUBIN TOTAL: 0.3 mg/dL (ref 0.2–1.2)
BUN: 19 mg/dL (ref 6–23)
CO2: 26 mEq/L (ref 19–32)
CREATININE: 1.2 mg/dL — AB (ref 0.50–1.10)
Calcium: 9.4 mg/dL (ref 8.4–10.5)
Chloride: 105 mEq/L (ref 96–112)
Glucose, Bld: 101 mg/dL — ABNORMAL HIGH (ref 70–99)
Potassium: 4.2 mEq/L (ref 3.5–5.3)
Sodium: 139 mEq/L (ref 135–145)
TOTAL PROTEIN: 6.8 g/dL (ref 6.0–8.3)

## 2014-10-20 LAB — LIPID PANEL
CHOL/HDL RATIO: 5.8 ratio
Cholesterol: 203 mg/dL — ABNORMAL HIGH (ref 0–200)
HDL: 35 mg/dL — ABNORMAL LOW (ref >=46)
LDL Cholesterol: 125 mg/dL — ABNORMAL HIGH (ref 0–99)
Triglycerides: 214 mg/dL — ABNORMAL HIGH (ref <150)
VLDL: 43 mg/dL — ABNORMAL HIGH (ref 0–40)

## 2014-10-26 ENCOUNTER — Encounter: Payer: Self-pay | Admitting: Family Medicine

## 2014-10-26 ENCOUNTER — Ambulatory Visit (INDEPENDENT_AMBULATORY_CARE_PROVIDER_SITE_OTHER): Payer: PPO | Admitting: Family Medicine

## 2014-10-26 VITALS — BP 110/64 | HR 78 | Temp 98.5°F | Resp 18 | Ht 65.0 in | Wt 166.0 lb

## 2014-10-26 DIAGNOSIS — Z13820 Encounter for screening for osteoporosis: Secondary | ICD-10-CM

## 2014-10-26 DIAGNOSIS — E785 Hyperlipidemia, unspecified: Secondary | ICD-10-CM | POA: Diagnosis not present

## 2014-10-26 DIAGNOSIS — Z79899 Other long term (current) drug therapy: Secondary | ICD-10-CM | POA: Diagnosis not present

## 2014-10-26 NOTE — Progress Notes (Signed)
Subjective:    Patient ID: Julie Neal, female    DOB: 07-06-1956, 59 y.o.   MRN: [id number]  HPI  patient is here today for follow-up. In November her cholesterol was checked and found to be elevated at 159. Her cholesterol last week was found to be  Less than 130.   Her kidney function has improved from 1.48-1.2. She still has chronic kidney disease. I also believe she is not drinking enough water. 3 months ago, the patient suffered 5 stress fractures in her left foot after a fall. I believe she would benefit from a bone density test. She denies any history of osteoporosis in her family. She is on numerous medications prescribed by her psychiatrist as well as her headache specialist. I am concerned about polypharmacy. Even today she is slurring her speech slightly. She is currently taking several mood altering substances and painkillers. I have recommended that she discuss with her psychiatrist possibly weaning her off the BuSpar. Also believe she might benefit in decreasing the Zanaflex. Appointment on 10/20/2014  Component Date Value Ref Range Status  . Sodium 10/20/2014 139  135 - 145 mEq/L Final  . Potassium 10/20/2014 4.2  3.5 - 5.3 mEq/L Final  . Chloride 10/20/2014 105  96 - 112 mEq/L Final  . CO2 10/20/2014 26  19 - 32 mEq/L Final  . Glucose, Bld 10/20/2014 101* 70 - 99 mg/dL Final  . BUN 10/20/2014 19  6 - 23 mg/dL Final  . Creat 10/20/2014 1.20* 0.50 - 1.10 mg/dL Final  . Total Bilirubin 10/20/2014 0.3  0.2 - 1.2 mg/dL Final  . Alkaline Phosphatase 10/20/2014 73  39 - 117 U/L Final  . AST 10/20/2014 12  0 - 37 U/L Final  . ALT 10/20/2014 <8  0 - 35 U/L Final  . Total Protein 10/20/2014 6.8  6.0 - 8.3 g/dL Final  . Albumin 10/20/2014 4.0  3.5 - 5.2 g/dL Final  . Calcium 10/20/2014 9.4  8.4 - 10.5 mg/dL Final  . Cholesterol 10/20/2014 203* 0 - 200 mg/dL Final   Comment: ATP III Classification:       < 200        mg/dL        Desirable      200 - 239     mg/dL        Borderline  High      >= 240        mg/dL        High     . Triglycerides 10/20/2014 214* <150 mg/dL Final  . HDL 10/20/2014 35* >=46 mg/dL Final   ** Please note change in reference range(s). **  . Total CHOL/HDL Ratio 10/20/2014 5.8   Final  . VLDL 10/20/2014 43* 0 - 40 mg/dL Final  . LDL Cholesterol 10/20/2014 125* 0 - 99 mg/dL Final   Comment:   Total Cholesterol/HDL Ratio:CHD Risk                        Coronary Heart Disease Risk Table                                        Men       Women          1/2 Average Risk              3.4  3.3              Average Risk              5.0        4.4           2X Average Risk              9.6        7.1           3X Average Risk             23.4       11.0 Use the calculated Patient Ratio above and the CHD Risk table  to determine the patient's CHD Risk. ATP III Classification (LDL):       < 100        mg/dL         Optimal      100 - 129     mg/dL         Near or Above Optimal      130 - 159     mg/dL         Borderline High      160 - 189     mg/dL         High       > 190        mg/dL         Very High      Past Medical History  Diagnosis Date  . Hyperlipidemia   . Depression   . Neuralgic migraines   . Anxiety   . Cervicalgia   . Memory loss   . Lumbago   . Fibromyalgia   . Migraines   . Fracture of ankle, bimalleolar, left, closed 08/10/2014   Past Surgical History  Procedure Laterality Date  . Abdominal hysterectomy    . Back surgery    . Neck surgery    . Leg surgery Bilateral   . Hand surgery Bilateral   . Shoulder surgery Left    Current Outpatient Prescriptions on File Prior to Visit  Medication Sig Dispense Refill  . buPROPion (WELLBUTRIN XL) 300 MG 24 hr tablet TAKE 1 TABLET BY MOUTH DAILY 30 tablet 2  . busPIRone (BUSPAR) 15 MG tablet TAKE 1 TABLET BY MOUTH TWICE A DAY 60 tablet 2  . citalopram (CELEXA) 20 MG tablet Take 1 tablet (20 mg total) by mouth daily. 30 tablet 2  . diazepam (VALIUM) 5 MG tablet TAKE  1 TABLET TWICE A DAY AS NEEDED 60 tablet 0  . oxyCODONE-acetaminophen (PERCOCET) 10-325 MG per tablet Take 1 tablet by mouth 4 (four) times daily.    Marland Kitchen tiZANidine (ZANAFLEX) 4 MG tablet Take 4 mg by mouth every 6 (six) hours as needed.     . topiramate (TOPAMAX) 50 MG tablet TAKE 2 TABLETS BY MOUTH 2 TIMES DAILY. 120 tablet 0  . traZODone (DESYREL) 100 MG tablet Take 2 tablets (200 mg total) by mouth at bedtime. 2 tabs po qhs 60 tablet 2   No current facility-administered medications on file prior to visit.   Allergies  Allergen Reactions  . Mobic [Meloxicam] Anaphylaxis  . Penicillins Anaphylaxis  . Nubain [Nalbuphine Hcl]    History   Social History  . Marital Status: Married    Spouse Name: [name]  . Number of Children: 1  . Years of Education: college   Occupational History  .      disabled  Social History Main Topics  . Smoking status: Former Smoker    Types: Cigarettes  . Smokeless tobacco: Never Used     Comment: Quit in 1988  . Alcohol Use: No  . Drug Use: No  . Sexual Activity: Not on file   Other Topics Concern  . Not on file   Social History Narrative   Patient lives at home with her husband [name])  and has a college education. Patient has one child.   Patient is disabled. Right handed.   Caffeine one cup of coffee.      Review of Systems  All other systems reviewed and are negative.      Objective:   Physical Exam  Constitutional: She appears well-developed and well-nourished.  Neck: Neck supple. No thyromegaly present.  Cardiovascular: Normal rate, regular rhythm, normal heart sounds and intact distal pulses.   No murmur heard. Pulmonary/Chest: Effort normal and breath sounds normal. No respiratory distress. She has no wheezes. She has no rales.  Abdominal: Soft. Bowel sounds are normal. She exhibits no distension. There is no tenderness. There is no rebound and no guarding.  Musculoskeletal: She exhibits no edema.  Vitals  reviewed.         Assessment & Plan:  Screening for osteoporosis - Plan: DG Bone Density  HLD (hyperlipidemia)  Polypharmacy   I have recommended she discuss weaning off BuSpar with her psychiatrist. I've also recommended she decrease the frequency of Zanaflex. I believe the polypharmacy contributed to the fall that caused her foot fracture. I will also schedule the patient for a bone density to screen for osteoporosis. Her mammogram, Pap smear, and colonoscopy are up-to-date. Her immunizations are up-to-date. Her cholesterol is much better. No other changes at this time

## 2014-11-06 ENCOUNTER — Other Ambulatory Visit: Payer: Self-pay | Admitting: Family Medicine

## 2014-11-07 ENCOUNTER — Other Ambulatory Visit: Payer: Self-pay | Admitting: Neurology

## 2014-11-07 ENCOUNTER — Other Ambulatory Visit (HOSPITAL_COMMUNITY): Payer: Self-pay | Admitting: Psychiatry

## 2014-11-07 NOTE — Telephone Encounter (Signed)
Ok to refill??  Last office visit 10/26/2014.  Last refill 10/13/2014.

## 2014-11-07 NOTE — Telephone Encounter (Signed)
ok 

## 2014-11-07 NOTE — Telephone Encounter (Signed)
rx called in

## 2014-11-07 NOTE — Telephone Encounter (Signed)
Refill request sent from [facility] via computer for Celexa.  RX was sent 10-16-14 with 2 refills. Called [facility] and spoke with Raquel Sarna. Per Raquel Sarna request sent in error was looking at old RX, patient has enough medication.

## 2014-11-09 ENCOUNTER — Encounter: Payer: Self-pay | Admitting: *Deleted

## 2014-11-22 ENCOUNTER — Other Ambulatory Visit: Payer: Self-pay | Admitting: Neurology

## 2014-12-07 ENCOUNTER — Other Ambulatory Visit (HOSPITAL_COMMUNITY): Payer: Self-pay | Admitting: Psychiatry

## 2014-12-07 NOTE — Telephone Encounter (Signed)
Request for refill of patient's Trazodone declined at this time due to being too early to refill.  A new order was e-scribed on 10/16/14 plus 2 refills and patient returns for next evaluation on 01/16/15.

## 2014-12-16 ENCOUNTER — Other Ambulatory Visit: Payer: Self-pay | Admitting: Family Medicine

## 2014-12-18 NOTE — Telephone Encounter (Signed)
Medication called to pharmacy. 

## 2014-12-18 NOTE — Telephone Encounter (Signed)
ok 

## 2014-12-18 NOTE — Telephone Encounter (Signed)
Ok to refill??  Last office visit 10/26/2014.  Last refill 11/07/2014.

## 2015-01-01 ENCOUNTER — Other Ambulatory Visit: Payer: Self-pay | Admitting: Neurology

## 2015-01-04 ENCOUNTER — Other Ambulatory Visit (HOSPITAL_COMMUNITY): Payer: Self-pay | Admitting: Psychiatry

## 2015-01-04 DIAGNOSIS — F330 Major depressive disorder, recurrent, mild: Secondary | ICD-10-CM

## 2015-01-05 NOTE — Telephone Encounter (Signed)
Patient last seen 10-16-14. NT'I last written for Wellbutrin and Buspar on 10-04-14 with two refills.  Patient is due for refill.  Patient due to follow up 01-16-15.

## 2015-01-16 ENCOUNTER — Encounter (HOSPITAL_COMMUNITY): Payer: Self-pay | Admitting: Psychiatry

## 2015-01-16 ENCOUNTER — Ambulatory Visit (INDEPENDENT_AMBULATORY_CARE_PROVIDER_SITE_OTHER): Payer: PPO | Admitting: Psychiatry

## 2015-01-16 VITALS — BP 98/60 | HR 56 | Ht 66.0 in | Wt 166.2 lb

## 2015-01-16 DIAGNOSIS — F339 Major depressive disorder, recurrent, unspecified: Secondary | ICD-10-CM | POA: Diagnosis not present

## 2015-01-16 DIAGNOSIS — F330 Major depressive disorder, recurrent, mild: Secondary | ICD-10-CM

## 2015-01-16 MED ORDER — BUSPIRONE HCL 15 MG PO TABS
15.0000 mg | ORAL_TABLET | Freq: Two times a day (BID) | ORAL | Status: DC
Start: 2015-01-16 — End: 2015-04-18

## 2015-01-16 MED ORDER — BUPROPION HCL ER (XL) 300 MG PO TB24: 300.0000 mg | ORAL_TABLET | Freq: Every day | ORAL | Status: DC

## 2015-01-16 MED ORDER — TRAZODONE HCL 100 MG PO TABS: 200.0000 mg | ORAL_TABLET | Freq: Every day | ORAL | Status: DC

## 2015-01-16 MED ORDER — CITALOPRAM HYDROBROMIDE 20 MG PO TABS: 20.0000 mg | ORAL_TABLET | Freq: Every day | ORAL | Status: DC

## 2015-01-16 NOTE — Progress Notes (Signed)
[facility] Progress Note   Julie Neal [id number] 59 y.o.  01/16/2015 3:27 PM  Chief Complaint:  Medication management and follow-up.       History of Present Illness:  Julie Neal came for her followup appointment.  She is compliant with her medication and denies any side effects.  Despite taking 3 and depressed and she still has some time depressive thoughts and insomnia.  She is relieved that her cast is removed from the ankle but she still requires wheelchair due to off-balance.  She denies any feeling of hopelessness or worthlessness.  Sometimes she feels anxious and nervous and depressed.  She denies any major panic attack.  She endorse chronic pain .  She is no longer taking Topamax because she has not seen neurologist and her primary care physician does not feel comfortable giving the Topamax.  She lives with her husband.  Her appetite is okay.  Her vitals are stable.  Patient denies any paranoia or any hallucination.  Suicidal Ideation: No Plan Formed: No Patient has means to carry out plan: No  Homicidal Ideation: No Plan Formed: No Patient has means to carry out plan: No  Past Psychiatric History/Hospitalization(s) Patient endorsed history of depression when her son drowned at age 59.  She was getting antidepressants from her primary care physician and then from Dr. Reece Levy.  She endorsed history of using cocaine in her early 98s .  She did not recall her previous medications.   Anxiety: Yes Bipolar Disorder: No Depression: Yes Mania: No Psychosis: No Schizophrenia: No Personality Disorder: No Hospitalization for psychiatric illness: No History of Electroconvulsive Shock Therapy: No Prior Suicide Attempts: No  Medical History; Patient has hyperlipidemia, neurologic migraine, memory impairment, Lumbago, fibromyalgia and migraine.  She has difficulty walking because of chronic pain.  Her primary care physician is Dr Dennard Schaumann, He neurologist is Dr Krista Blue and her pain  specialist is Dr. Florene Glen.  Review of Systems  Constitutional: Negative.   Musculoskeletal: Positive for joint pain.  Skin: Negative for itching and rash.  Neurological: Negative for tremors.  Psychiatric/Behavioral: Positive for depression. Negative for hallucinations and substance abuse. The patient is nervous/anxious.     Psychiatric: Agitation: No Hallucination: No Depressed Mood: Yes Insomnia: Yes Hypersomnia: No Altered Concentration: No Feels Worthless: No Grandiose Ideas: No Belief In Special Powers: No New/Increased Substance Abuse: No Compulsions: No  Neurologic: Headache: Yes Seizure: Patient endorsed seizure-like activity twice in the past. Paresthesias: No    Outpatient Encounter Prescriptions as of 01/16/2015  Medication Sig  . buPROPion (WELLBUTRIN XL) 300 MG 24 hr tablet Take 1 tablet (300 mg total) by mouth daily.  . busPIRone (BUSPAR) 15 MG tablet Take 1 tablet (15 mg total) by mouth 2 (two) times daily.  . citalopram (CELEXA) 20 MG tablet Take 1 tablet (20 mg total) by mouth daily.  . diazepam (VALIUM) 5 MG tablet TAKE 1 TABLET TWICE A DAY AS NEEDED  . oxyCODONE-acetaminophen (PERCOCET) 10-325 MG per tablet Take 1 tablet by mouth 4 (four) times daily.  Marland Kitchen tiZANidine (ZANAFLEX) 4 MG tablet Take 4 mg by mouth every 6 (six) hours as needed.   . traZODone (DESYREL) 100 MG tablet Take 2 tablets (200 mg total) by mouth at bedtime. 2 tabs po qhs  . [DISCONTINUED] buPROPion (WELLBUTRIN XL) 300 MG 24 hr tablet TAKE 1 TABLET BY MOUTH DAILY  . [DISCONTINUED] busPIRone (BUSPAR) 15 MG tablet TAKE 1 TABLET BY MOUTH TWICE A DAY  . [DISCONTINUED] citalopram (CELEXA) 20 MG tablet  Take 1 tablet (20 mg total) by mouth daily.  . [DISCONTINUED] topiramate (TOPAMAX) 50 MG tablet TAKE 2 TABLETS BY MOUTH 2 TIMES DAILY.  . [DISCONTINUED] traZODone (DESYREL) 100 MG tablet Take 2 tablets (200 mg total) by mouth at bedtime. 2 tabs po qhs   No facility-administered encounter medications  on file as of 01/16/2015.    Recent Results (from the past 2160 hour(s))  Comprehensive metabolic panel     Status: Abnormal   Collection Time: 10/20/14  9:23 AM  Result Value Ref Range   Sodium 139 135 - 145 mEq/L   Potassium 4.2 3.5 - 5.3 mEq/L   Chloride 105 96 - 112 mEq/L   CO2 26 19 - 32 mEq/L   Glucose, Bld 101 (H) 70 - 99 mg/dL   BUN 19 6 - 23 mg/dL   Creat 1.20 (H) 0.50 - 1.10 mg/dL   Total Bilirubin 0.3 0.2 - 1.2 mg/dL   Alkaline Phosphatase 73 39 - 117 U/L   AST 12 0 - 37 U/L   ALT <8 0 - 35 U/L   Total Protein 6.8 6.0 - 8.3 g/dL   Albumin 4.0 3.5 - 5.2 g/dL   Calcium 9.4 8.4 - 10.5 mg/dL  Lipid panel     Status: Abnormal   Collection Time: 10/20/14  9:23 AM  Result Value Ref Range   Cholesterol 203 (H) 0 - 200 mg/dL    Comment: ATP III Classification:       < 200        mg/dL        Desirable      200 - 239     mg/dL        Borderline High      >= 240        mg/dL        High      Triglycerides 214 (H) <150 mg/dL   HDL 35 (L) >=46 mg/dL    Comment: ** Please note change in reference range(s). **   Total CHOL/HDL Ratio 5.8 Ratio   VLDL 43 (H) 0 - 40 mg/dL   LDL Cholesterol 125 (H) 0 - 99 mg/dL    Comment:   Total Cholesterol/HDL Ratio:CHD Risk                        Coronary Heart Disease Risk Table                                        Men       Women          1/2 Average Risk              3.4        3.3              Average Risk              5.0        4.4           2X Average Risk              9.6        7.1           3X Average Risk             23.4       11.0 Use the calculated Patient  Ratio above and the CHD Risk table  to determine the patient's CHD Risk. ATP III Classification (LDL):       < 100        mg/dL         Optimal      100 - 129     mg/dL         Near or Above Optimal      130 - 159     mg/dL         Borderline High      160 - 189     mg/dL         High       > 190        mg/dL         Very High         Physical  Exam: Constitutional:  BP 98/60 mmHg  Pulse 56  Ht 5\' 6"  (1.676 m)  Wt 166 lb 3.2 oz (75.388 kg)  BMI 26.84 kg/m2  Musculoskeletal: Strength & Muscle Tone: within normal limits Gait & Station: She is using wheelchair her gait is still unsteady.   Patient leans: Front and Backward  Mental Status Examination;  Patient is casually dressed and fairly groomed.  She is using wheelchair .  She maintained good eye contact.  Her speech is slow but coherent.  Her thought processes slow but logical and goal-directed.  Her attention and concentration is fair.  She described her mood  euthymic and her affect is appropriate.  She denies any auditory or visual hallucination.  She denies any active or passive suicidal thoughts and homicidal thoughts.  There were no paranoia, delusions or any obsessive thoughts.  She has no tremors or shakes.  Her fund of knowledge is average.  Her thought process is slow but logical and goal-directed.  She is alert and oriented x3.  Her insight judgment and impulse control is okay.   Established Problem, Stable/Improving (1), Review of Psycho-Social Stressors (1), Review of Last Therapy Session (1) and Review of Medication Regimen & Side Effects (2)  Assessment: Axis I: Maj. depressive disorder, recurrent  Axis II: Deferred  Axis III:  Past Medical History  Diagnosis Date  . Hyperlipidemia   . Depression   . Neuralgic migraines   . Anxiety   . Cervicalgia   . Memory loss   . Lumbago   . Fibromyalgia   . Migraines   . Fracture of ankle, bimalleolar, left, closed 08/10/2014    Plan:  Patient still has restless symptoms of depression however she is taking her medication as prescribed.  She denies any side effects.  She has no tremors or shakes.  I will continue her current psychotropic medication.  She is taking Celexa 20 mg daily, BuSpar 15 mg twice a day , trazodone 200 mg at bedtime and Wellbutrin XL 300 mg daily.  Patient is getting Valium from her primary  care physician. Patient does not want counseling.  Recommended to call us back if she has any question or any concern.  I will see her again in 3 months.   Imad Shostak T., MD 01/16/2015

## 2015-01-19 ENCOUNTER — Other Ambulatory Visit: Payer: Self-pay | Admitting: Family Medicine

## 2015-01-22 NOTE — Telephone Encounter (Signed)
?   OK to Refill  

## 2015-01-22 NOTE — Telephone Encounter (Signed)
ok 

## 2015-01-22 NOTE — Telephone Encounter (Signed)
Medication called to pharmacy. 

## 2015-02-03 ENCOUNTER — Other Ambulatory Visit (HOSPITAL_COMMUNITY): Payer: Self-pay | Admitting: Psychiatry

## 2015-02-05 ENCOUNTER — Other Ambulatory Visit (HOSPITAL_COMMUNITY): Payer: Self-pay | Admitting: *Deleted

## 2015-02-05 ENCOUNTER — Other Ambulatory Visit (HOSPITAL_COMMUNITY): Payer: Self-pay | Admitting: Psychiatry

## 2015-02-05 NOTE — Telephone Encounter (Signed)
Given on 01/16/15 with 2 refills.

## 2015-02-05 NOTE — Telephone Encounter (Signed)
Fax received requesting refills. Chart reviewed and not needed. Too soon. Notified pharmacy who states they have no record of faxing information. Placed paper copy that was faxed in drawer with completed medication refills.

## 2015-02-11 ENCOUNTER — Other Ambulatory Visit (HOSPITAL_COMMUNITY): Payer: Self-pay | Admitting: Psychiatry

## 2015-02-11 DIAGNOSIS — F330 Major depressive disorder, recurrent, mild: Secondary | ICD-10-CM

## 2015-02-13 NOTE — Telephone Encounter (Signed)
One time refill of patient's prescribed Buspar approved by Dr. Dwyane Dee and e-scribed to patient's [facility] on [facility] as ordered.

## 2015-02-14 ENCOUNTER — Encounter: Payer: Self-pay | Admitting: Internal Medicine

## 2015-02-15 ENCOUNTER — Other Ambulatory Visit: Payer: Self-pay | Admitting: Family Medicine

## 2015-02-15 NOTE — Telephone Encounter (Signed)
LRF 01/22/15.  Too soon.  Denied

## 2015-02-21 ENCOUNTER — Other Ambulatory Visit: Payer: Self-pay | Admitting: Family Medicine

## 2015-02-21 NOTE — Telephone Encounter (Signed)
LRF 01/22/15 #60.  LOV 10/26/14  OK refill?

## 2015-02-22 ENCOUNTER — Other Ambulatory Visit: Payer: Self-pay | Admitting: Orthopedic Surgery

## 2015-02-22 DIAGNOSIS — M5136 Other intervertebral disc degeneration, lumbar region: Secondary | ICD-10-CM

## 2015-02-22 NOTE — Telephone Encounter (Signed)
Medication refilled per protocol. 

## 2015-02-22 NOTE — Telephone Encounter (Signed)
ok 

## 2015-03-02 ENCOUNTER — Other Ambulatory Visit (HOSPITAL_COMMUNITY): Payer: Self-pay | Admitting: Psychiatry

## 2015-03-05 ENCOUNTER — Ambulatory Visit
Admission: RE | Admit: 2015-03-05 | Discharge: 2015-03-05 | Disposition: A | Discharge status: Home / Self Care | Payer: PPO | Source: Ambulatory Visit | Attending: Orthopedic Surgery | Admitting: Orthopedic Surgery

## 2015-03-05 DIAGNOSIS — M5136 Other intervertebral disc degeneration, lumbar region: Secondary | ICD-10-CM

## 2015-03-05 MED ORDER — ONDANSETRON HCL 4 MG/2ML IJ SOLN
4.0000 mg | Freq: Once | INTRAMUSCULAR | Status: AC
  Administered 2015-03-05: 4 mg via INTRAMUSCULAR

## 2015-03-05 MED ORDER — IOHEXOL 180 MG/ML  SOLN
15.0000 mL | Freq: Once | INTRAMUSCULAR | Status: AC | PRN
  Administered 2015-03-05: 15 mL via INTRATHECAL

## 2015-03-05 MED ORDER — MEPERIDINE HCL 100 MG/ML IJ SOLN
50.0000 mg | Freq: Once | INTRAMUSCULAR | Status: AC
  Administered 2015-03-05: 50 mg via INTRAMUSCULAR

## 2015-03-05 MED ORDER — DIAZEPAM 5 MG PO TABS
5.0000 mg | ORAL_TABLET | Freq: Once | ORAL | Status: AC
  Administered 2015-03-05: 5 mg via ORAL

## 2015-03-05 NOTE — Progress Notes (Signed)
Patient states she has been off Buspar, Celexa, Trazodone, Wellbutrin for at least the past two days.  jkl

## 2015-03-05 NOTE — Discharge Instructions (Signed)
[facility] Discharge Instructions  1. Go home and rest quietly for the next 24 hours.  It is important to lie flat for the next 24 hours.  Get up only to go to the restroom.  You may lie in the bed or on a couch on your back, your stomach, your left side or your right side.  You may have one pillow under your head.  You may have pillows between your knees while you are on your side or under your knees while you are on your back.  2. DO NOT drive today.  Recline the seat as far back as it will go, while still wearing your seat belt, on the way home.  3. You may get up to go to the bathroom as needed.  You may sit up for 10 minutes to eat.  You may resume your normal diet and medications unless otherwise indicated.  Drink plenty of extra fluids today and tomorrow.  4. The incidence of a spinal headache with nausea and/or vomiting is about 5% (one in 20 patients).  If you develop a headache, lie flat and drink plenty of fluids until the headache goes away.  Caffeinated beverages may be helpful.  If you develop severe nausea and vomiting or a headache that does not go away with flat bed rest, call [phone].  5. You may resume normal activities after your 24 hours of bed rest is over; however, do not exert yourself strongly or do any heavy lifting tomorrow.  6. Call your physician for a follow-up appointment.   You may resume Buspar, Celexa, Trazodone and Wellbutrin on Tuesday, March 06, 2015 after 1:00p.m.

## 2015-03-12 ENCOUNTER — Telehealth: Payer: Self-pay | Admitting: *Deleted

## 2015-03-12 NOTE — Telephone Encounter (Signed)
Received fax from [facility] for authorization to have CT scan with authorization number [id number]  Requesting provider: Salome Arnt  Treating provider: Salome Arnt  Place of service: Imaging center  PCP: Flonnie Hailstone  Type of service: CT  Procedure: 72132-CT lumbar spine w Dye  Number of visits: 1  Start date: 03/02/15  End date: 08/29/15  Dx:M51.36-other interverbral disc degeneration,lumbar region

## 2015-03-22 ENCOUNTER — Emergency Department (HOSPITAL_COMMUNITY)
Admission: EM | Admit: 2015-03-22 | Discharge: 2015-03-22 | Disposition: A | Discharge status: Home / Self Care | Payer: PPO | Source: Home / Self Care | Attending: Family Medicine | Admitting: Family Medicine

## 2015-03-22 ENCOUNTER — Encounter (HOSPITAL_COMMUNITY): Payer: Self-pay | Admitting: Family Medicine

## 2015-03-22 DIAGNOSIS — S61219A Laceration without foreign body of unspecified finger without damage to nail, initial encounter: Secondary | ICD-10-CM

## 2015-03-22 NOTE — ED Notes (Signed)
Patient states was cutting up potatoes to make french fries And accidentally cut the tip of her left thumb Patient states this happened about 40 minutes ago

## 2015-03-22 NOTE — Discharge Instructions (Signed)
The cut to your thumb was cleaned and repaired with Dermabond in our clinic. Dermabond will come off in 7-10 days. The Dermabond makes the area water proof. Please come back if you start developing any signs of infection.

## 2015-03-22 NOTE — ED Provider Notes (Signed)
CSN: [id number]     Arrival date & time 03/22/15  1827 History   First MD Initiated Contact with Patient 03/22/15 1903     Chief Complaint  Patient presents with  . Laceration   (Consider location/radiation/quality/duration/timing/severity/associated sxs/prior Treatment) HPI  1 hour ago pt was cutting potatoes and sliced L thumb. Sensation and movement intact. The cut is constant. It is not getting any worse. The bleeding stopped after applying pressure. The cut is located on the medial aspect of the thumb.  Last tetanus less than 5 years ago.   Past Medical History  Diagnosis Date  . Hyperlipidemia   . Depression   . Neuralgic migraines   . Anxiety   . Cervicalgia   . Memory loss   . Lumbago   . Fibromyalgia   . Migraines   . Fracture of ankle, bimalleolar, left, closed 08/10/2014   Past Surgical History  Procedure Laterality Date  . Abdominal hysterectomy    . Back surgery    . Neck surgery    . Leg surgery Bilateral   . Hand surgery Bilateral   . Shoulder surgery Left    Family History  Problem Relation Age of Onset  . Heart attack Father   . Heart disease Father 61  . Alzheimer's disease Mother   . Stroke Mother   . Breast cancer Sister   . Diabetes type II Brother    Social History  Substance Use Topics  . Smoking status: Former Smoker    Types: Cigarettes  . Smokeless tobacco: Never Used     Comment: Quit in 1988  . Alcohol Use: No   OB History    No data available     Review of Systems Per HPI with all other pertinent systems negative.   Allergies  Mobic; Penicillins; and Nubain  Home Medications   Prior to Admission medications   Medication Sig Start Date End Date Taking? Authorizing Provider  buPROPion (WELLBUTRIN XL) 300 MG 24 hr tablet Take 1 tablet (300 mg total) by mouth daily. 01/16/15   Kathlee Nations, MD  busPIRone (BUSPAR) 15 MG tablet Take 1 tablet (15 mg total) by mouth 2 (two) times daily. 01/16/15   Kathlee Nations, MD  citalopram  (CELEXA) 20 MG tablet Take 1 tablet (20 mg total) by mouth daily. 01/16/15   Kathlee Nations, MD  diazepam (VALIUM) 5 MG tablet TAKE 1 TABLET TWICE A DAY AS NEEDED FOR ANXIETY 02/22/15   Susy Frizzle, MD  oxyCODONE-acetaminophen (PERCOCET) 10-325 MG per tablet Take 1 tablet by mouth 4 (four) times daily.    Historical Provider, MD  tiZANidine (ZANAFLEX) 4 MG tablet Take 4 mg by mouth every 6 (six) hours as needed.     Historical Provider, MD  traZODone (DESYREL) 100 MG tablet Take 2 tablets (200 mg total) by mouth at bedtime. 2 tabs po qhs 01/16/15   Kathlee Nations, MD   There were no vitals taken for this visit. Physical Exam Physical Exam  Constitutional: oriented to person, place, and time. appears well-developed and well-nourished. No distress.  HENT:  Head: Normocephalic and atraumatic.  Eyes: EOMI. PERRL.  Neck: Normal range of motion.  Cardiovascular: RRR, no m/r/g, 2+ distal pulses,  Pulmonary/Chest: Effort normal and breath sounds normal. No respiratory distress.  Abdominal: Soft. Bowel sounds are normal. NonTTP, no distension.  Musculoskeletal: Normal range of motion. Non ttp, no effusion.  Neurological: alert and oriented to person, place, and time.  Skin: Medial aspect of  left thumb with 0.7 cm linear laceration.  Psychiatric: normal mood and affect. behavior is normal. Judgment and thought content normal.   ED Course  LACERATION REPAIR Date/Time: 03/22/2015 7:27 PM Performed by: Marily Memos, Cherrish Vitali J Authorized by: Marily Memos, Romuald Mccaslin J Consent: Verbal consent obtained. Risks and benefits: risks, benefits and alternatives were discussed Consent given by: patient Patient identity confirmed: verbally with patient Location: L thumb. Laceration length: 0.7 cm Tendon involvement: none Nerve involvement: none Vascular damage: no Preparation: Patient was prepped and draped in the usual sterile fashion. Irrigation solution: Betadine and normal saline. Amount of cleaning:  standard Debridement: none Degree of undermining: none Skin closure: glue Approximation: close Approximation difficulty: simple Patient tolerance: Patient tolerated the procedure well with no immediate complications   (including critical care time) Labs Review Labs Reviewed - No data to display  Imaging Review No results found.   MDM   1. Finger laceration, initial encounter    Dermabond repair as above. Wound care instructions discussed. Tetanus up-to-date.    Waldemar Dickens, MD 03/22/15 [phone]

## 2015-03-25 ENCOUNTER — Other Ambulatory Visit: Payer: Self-pay | Admitting: Family Medicine

## 2015-03-25 ENCOUNTER — Other Ambulatory Visit (HOSPITAL_COMMUNITY): Payer: Self-pay | Admitting: Psychiatry

## 2015-03-26 NOTE — Telephone Encounter (Signed)
?   OK to Refill  

## 2015-03-26 NOTE — Telephone Encounter (Signed)
ok 

## 2015-04-05 ENCOUNTER — Other Ambulatory Visit (HOSPITAL_COMMUNITY): Payer: Self-pay | Admitting: Psychiatry

## 2015-04-12 ENCOUNTER — Other Ambulatory Visit (HOSPITAL_COMMUNITY): Payer: Self-pay | Admitting: Psychiatry

## 2015-04-12 ENCOUNTER — Telehealth (HOSPITAL_COMMUNITY): Payer: Self-pay

## 2015-04-12 NOTE — Telephone Encounter (Signed)
Medication management - Telephone call with pt twice this date and with pharmacist at [facility] on [facility] to verify pt still has a Buspar order to pick up and had picked up her last Wellbutrin on 03/28/15 so should have meds till 04/18/15 appt.  Patient stated she would pick up refill of Buspar and confirmed she had enough Wellbutrin until returns to see Dr. Adele Schilder on 04/18/15 at 2:15pm.  Patient to call back if any further concerns.

## 2015-04-18 ENCOUNTER — Encounter (HOSPITAL_COMMUNITY): Payer: Self-pay | Admitting: Psychiatry

## 2015-04-18 ENCOUNTER — Ambulatory Visit (INDEPENDENT_AMBULATORY_CARE_PROVIDER_SITE_OTHER): Payer: PPO | Admitting: Psychiatry

## 2015-04-18 VITALS — BP 96/64 | HR 69 | Ht 66.0 in | Wt 161.0 lb

## 2015-04-18 DIAGNOSIS — F339 Major depressive disorder, recurrent, unspecified: Secondary | ICD-10-CM

## 2015-04-18 DIAGNOSIS — F330 Major depressive disorder, recurrent, mild: Secondary | ICD-10-CM

## 2015-04-18 MED ORDER — BUSPIRONE HCL 15 MG PO TABS: 15.0000 mg | ORAL_TABLET | Freq: Two times a day (BID) | ORAL | Status: DC

## 2015-04-18 MED ORDER — CITALOPRAM HYDROBROMIDE 20 MG PO TABS: 20.0000 mg | ORAL_TABLET | Freq: Every day | ORAL | Status: DC

## 2015-04-18 MED ORDER — TRAZODONE HCL 100 MG PO TABS: 200.0000 mg | ORAL_TABLET | Freq: Every day | ORAL | Status: DC

## 2015-04-18 MED ORDER — BUPROPION HCL ER (XL) 300 MG PO TB24: 300.0000 mg | ORAL_TABLET | Freq: Every day | ORAL | Status: DC

## 2015-04-18 NOTE — Progress Notes (Signed)
[facility] Progress Note   Julie Neal [id number] 59 y.o.  04/18/2015 4:54 PM  Chief Complaint:  Medication management and follow-up.       History of Present Illness:  Julie Neal came for her followup appointment.  She is taking her medication as prescribed.  She denies any irritability, anger, mood swings.  She feel her medicines are working very well.  She denies any major panic attack.  She still have chronic pain and health issues but she is not having any crying spells.  She is seeing her primary care physician for her health management.  She has no tremors or shakes.  Her appetite is okay.  Her vitals are stable.  She denies any paranoia or any hallucination.  She lives with her husband who is very supportive.  Suicidal Ideation: No Plan Formed: No Patient has means to carry out plan: No  Homicidal Ideation: No Plan Formed: No Patient has means to carry out plan: No  Past Psychiatric History/Hospitalization(s) Patient endorsed history of depression when her son drowned at age 13.  She was getting antidepressants from her primary care physician and then from Dr. Reece Levy.  She endorsed history of using cocaine in her early 97s .  She did not recall her previous medications.   Anxiety: Yes Bipolar Disorder: No Depression: Yes Mania: No Psychosis: No Schizophrenia: No Personality Disorder: No Hospitalization for psychiatric illness: No History of Electroconvulsive Shock Therapy: No Prior Suicide Attempts: No  Medical History; Patient has hyperlipidemia, neurologic migraine, memory impairment, Lumbago, fibromyalgia and migraine.  She has difficulty walking because of chronic pain.  Her primary care physician is Dr Dennard Schaumann, He neurologist is Dr Krista Blue and her pain specialist is Dr. Florene Glen.  Review of Systems  Constitutional: Negative.   Musculoskeletal: Positive for joint pain.  Skin: Negative for itching and rash.  Neurological: Negative for tremors.   Psychiatric/Behavioral: Negative for hallucinations and substance abuse.    Psychiatric: Agitation: No Hallucination: No Depressed Mood: Yes Insomnia: Yes Hypersomnia: No Altered Concentration: No Feels Worthless: No Grandiose Ideas: No Belief In Special Powers: No New/Increased Substance Abuse: No Compulsions: No  Neurologic: Headache: Yes Seizure: Patient endorsed seizure-like activity twice in the past. Paresthesias: No    Outpatient Encounter Prescriptions as of 04/18/2015  Medication Sig  . buPROPion (WELLBUTRIN XL) 300 MG 24 hr tablet Take 1 tablet (300 mg total) by mouth daily.  . busPIRone (BUSPAR) 15 MG tablet Take 1 tablet (15 mg total) by mouth 2 (two) times daily.  . citalopram (CELEXA) 20 MG tablet Take 1 tablet (20 mg total) by mouth daily.  . diazepam (VALIUM) 5 MG tablet TAKE 1 TABLET TWICE A DAY AS NEEDED  . oxyCODONE-acetaminophen (PERCOCET) 10-325 MG per tablet Take 1 tablet by mouth 4 (four) times daily.  Marland Kitchen tiZANidine (ZANAFLEX) 4 MG tablet Take 4 mg by mouth every 6 (six) hours as needed.   . traZODone (DESYREL) 100 MG tablet Take 2 tablets (200 mg total) by mouth at bedtime. 2 tabs po qhs  . [DISCONTINUED] buPROPion (WELLBUTRIN XL) 300 MG 24 hr tablet Take 1 tablet (300 mg total) by mouth daily.  . [DISCONTINUED] busPIRone (BUSPAR) 15 MG tablet Take 1 tablet (15 mg total) by mouth 2 (two) times daily.  . [DISCONTINUED] citalopram (CELEXA) 20 MG tablet Take 1 tablet (20 mg total) by mouth daily.  . [DISCONTINUED] traZODone (DESYREL) 100 MG tablet Take 2 tablets (200 mg total) by mouth at bedtime. 2 tabs po qhs  No facility-administered encounter medications on file as of 04/18/2015.    No results found for this or any previous visit (from the past 2160 hour(s)).    Physical Exam: Constitutional:  BP 96/64 mmHg  Pulse 69  Ht 5\' 6"  (1.676 m)  Wt 161 lb (73.029 kg)  BMI 26.00 kg/m2  Musculoskeletal: Strength & Muscle Tone: within normal  limits Gait & Station: She is using wheelchair her gait is still unsteady.   Patient leans: Front and Backward  Mental Status Examination;  Patient is pleasant and cooperative.  She is casually dressed and groomed.  She has difficulty walking and she need stick to help walking.  She maintained good eye contact.  Her speech is slow but coherent.  Her thought processes slow but logical and goal-directed.  Her attention and concentration is fair.  She described her mood  euthymic and her affect is appropriate.  She denies any auditory or visual hallucination.  She denies any active or passive suicidal thoughts and homicidal thoughts.  There were no paranoia, delusions or any obsessive thoughts.  She has no tremors or shakes.  Her fund of knowledge is average.  Her thought process is slow but logical and goal-directed.  She is alert and oriented x3.  Her insight judgment and impulse control is okay.   Established Problem, Stable/Improving (1), Review of Psycho-Social Stressors (1), Review of Last Therapy Session (1) and Review of Medication Regimen & Side Effects (2)  Assessment: Axis I: Maj. depressive disorder, recurrent  Axis II: Deferred  Axis III:  Past Medical History  Diagnosis Date  . Hyperlipidemia   . Depression   . Neuralgic migraines   . Anxiety   . Cervicalgia   . Memory loss   . Lumbago   . Fibromyalgia   . Migraines   . Fracture of ankle, bimalleolar, left, closed 08/10/2014    Plan:  Patient is a stable on her current medication.  She is taking 3 anti depressant.  She has no side effects including any tremors or shakes. I will continue her Celexa 20 mg daily, BuSpar 15 mg twice a day , trazodone 200 mg at bedtime and Wellbutrin XL 300 mg daily.  Patient is getting Valium from her primary care physician. Patient does not want counseling.  Recommended to call us back if she has any question or any concern.  I will see her again in 3 months.   [name],  MD 04/18/2015

## 2015-04-20 ENCOUNTER — Other Ambulatory Visit: Payer: Self-pay | Admitting: Family Medicine

## 2015-04-23 NOTE — Telephone Encounter (Signed)
?   OK to Refill  

## 2015-04-23 NOTE — Telephone Encounter (Signed)
Medication refilled per protocol. 

## 2015-04-23 NOTE — Telephone Encounter (Signed)
ok 

## 2015-05-22 ENCOUNTER — Other Ambulatory Visit: Payer: Self-pay | Admitting: Family Medicine

## 2015-05-22 NOTE — Telephone Encounter (Signed)
?   Ok to refill  Last ov 10/26/14  Last rf 04/23/15

## 2015-05-22 NOTE — Telephone Encounter (Signed)
ok 

## 2015-05-22 NOTE — Telephone Encounter (Signed)
Medication called to pharmacy. 

## 2015-06-21 ENCOUNTER — Telehealth: Payer: Self-pay | Admitting: Family Medicine

## 2015-06-21 MED ORDER — DIAZEPAM 5 MG PO TABS: 5.0000 mg | ORAL_TABLET | Freq: Two times a day (BID) | ORAL | Status: DC | PRN

## 2015-06-21 NOTE — Telephone Encounter (Signed)
ok 

## 2015-06-21 NOTE — Telephone Encounter (Signed)
Requesting refill on valium - ? OK to Refill

## 2015-06-21 NOTE — Telephone Encounter (Signed)
Medication called/sent to requested pharmacy  

## 2015-07-01 ENCOUNTER — Other Ambulatory Visit (HOSPITAL_COMMUNITY): Payer: Self-pay | Admitting: Psychiatry

## 2015-07-12 ENCOUNTER — Telehealth (HOSPITAL_COMMUNITY): Payer: Self-pay

## 2015-07-12 NOTE — Telephone Encounter (Signed)
Telephone call with patient after receiving more faxes from [facility] 90 day refills of patient's medications.  Patient stated she had enough medications to last until she sees Dr. Adele Schilder on 07/18/15 and even enough for after until he can send orders to the new mail delivery company that date for time until it is delivered.  Patient stated she would have Dr. Adele Schilder send new orders to the new home pharmacy delivery company on 07/18/15 and that this was not needed at present.  [place] and informed patient reported she has medications until she returns on 07/18/15 and orders will be e-scribed at that time to respond to fax requests the company keeps sending for new orders.

## 2015-07-18 ENCOUNTER — Encounter (HOSPITAL_COMMUNITY): Payer: Self-pay | Admitting: Psychiatry

## 2015-07-18 ENCOUNTER — Ambulatory Visit (INDEPENDENT_AMBULATORY_CARE_PROVIDER_SITE_OTHER): Payer: No Typology Code available for payment source | Admitting: Psychiatry

## 2015-07-18 VITALS — BP 110/68 | HR 89 | Ht 66.0 in | Wt 150.0 lb

## 2015-07-18 DIAGNOSIS — F330 Major depressive disorder, recurrent, mild: Secondary | ICD-10-CM | POA: Diagnosis not present

## 2015-07-18 MED ORDER — BUPROPION HCL ER (XL) 300 MG PO TB24: 300.0000 mg | ORAL_TABLET | Freq: Every day | ORAL | Status: DC

## 2015-07-18 MED ORDER — BUSPIRONE HCL 15 MG PO TABS: 15.0000 mg | ORAL_TABLET | Freq: Two times a day (BID) | ORAL | Status: DC

## 2015-07-18 MED ORDER — TRAZODONE HCL 100 MG PO TABS: 200.0000 mg | ORAL_TABLET | Freq: Every day | ORAL | Status: DC

## 2015-07-18 MED ORDER — CITALOPRAM HYDROBROMIDE 20 MG PO TABS: 20.0000 mg | ORAL_TABLET | Freq: Every day | ORAL | Status: DC

## 2015-07-18 NOTE — Progress Notes (Signed)
[name] Progress Note   CATTALEYA SA [id number] 59 y.o.  07/18/2015 10:43 AM  Chief Complaint:  Medication management and follow-up.       History of Present Illness:  Julie Neal came for her followup appointment.   She has noticed increase fibromyalgia pain in recent weeks and sometime pain is so severe that she has crying spells. But she feels her antidepressant is working well.  She denies any irritability, anger, mood swing.  She denies any feeling of hopelessness or worthlessness. She is sleeping good unless she is in a lot of pain. She denies any major panic attack. She denies any suicidal thoughts or homicidal thought.  She had a good Thanksgiving and she is hoping to have a good Christmas. She lives with her husband is very supportive.  She denies drinking or using any illegal substances.  She denies any paranoia or any hallucination.  Her appetite is okay.  Her vitals are stable.  Suicidal Ideation: No Plan Formed: No Patient has means to carry out plan: No  Homicidal Ideation: No Plan Formed: No Patient has means to carry out plan: No  Past Psychiatric History/Hospitalization(s) Patient endorsed history of depression when her son drowned at age 33.  She was getting antidepressants from her primary care physician and then from Dr. Reece Levy.  She endorsed history of using cocaine in her early 82s .  She did not recall her previous medications.   Anxiety: Yes Bipolar Disorder: No Depression: Yes Mania: No Psychosis: No Schizophrenia: No Personality Disorder: No Hospitalization for psychiatric illness: No History of Electroconvulsive Shock Therapy: No Prior Suicide Attempts: No  Medical History; Patient has hyperlipidemia, neurologic migraine, memory impairment, Lumbago, fibromyalgia and migraine.  She has difficulty walking because of chronic pain.  Her primary care physician is Dr Dennard Schaumann, He neurologist is Dr Krista Blue and her pain specialist is Dr.  Florene Glen.  Review of Systems  Constitutional: Negative.   Musculoskeletal: Positive for joint pain.  Skin: Negative for itching and rash.  Neurological: Negative for tremors.  Psychiatric/Behavioral: Negative for hallucinations and substance abuse.    Psychiatric: Agitation: No Hallucination: No Depressed Mood: Yes Insomnia: Yes Hypersomnia: No Altered Concentration: No Feels Worthless: No Grandiose Ideas: No Belief In Special Powers: No New/Increased Substance Abuse: No Compulsions: No  Neurologic: Headache: Yes Seizure: Patient endorsed seizure-like activity twice in the past. Paresthesias: No    Outpatient Encounter Prescriptions as of 07/18/2015  Medication Sig  . buPROPion (WELLBUTRIN XL) 300 MG 24 hr tablet Take 1 tablet (300 mg total) by mouth daily.  . busPIRone (BUSPAR) 15 MG tablet Take 1 tablet (15 mg total) by mouth 2 (two) times daily.  . citalopram (CELEXA) 20 MG tablet Take 1 tablet (20 mg total) by mouth daily.  . diazepam (VALIUM) 5 MG tablet Take 1 tablet (5 mg total) by mouth 2 (two) times daily as needed.  Marland Kitchen oxyCODONE-acetaminophen (PERCOCET) 10-325 MG per tablet Take 1 tablet by mouth 4 (four) times daily.  Marland Kitchen tiZANidine (ZANAFLEX) 4 MG tablet Take 4 mg by mouth every 6 (six) hours as needed.   . traZODone (DESYREL) 100 MG tablet Take 2 tablets (200 mg total) by mouth at bedtime. 2 tabs po qhs  . [DISCONTINUED] buPROPion (WELLBUTRIN XL) 300 MG 24 hr tablet Take 1 tablet (300 mg total) by mouth daily.  . [DISCONTINUED] busPIRone (BUSPAR) 15 MG tablet Take 1 tablet (15 mg total) by mouth 2 (two) times daily.  . [DISCONTINUED] citalopram (CELEXA) 20 MG tablet  Take 1 tablet (20 mg total) by mouth daily.  . [DISCONTINUED] traZODone (DESYREL) 100 MG tablet Take 2 tablets (200 mg total) by mouth at bedtime. 2 tabs po qhs   No facility-administered encounter medications on file as of 07/18/2015.    No results found for this or any previous visit (from the past  2160 hour(s)).    Physical Exam: Constitutional:  BP 110/68 mmHg  Pulse 89  Ht 5\' 6"  (1.676 m)  Wt 150 lb (68.04 kg)  BMI 24.22 kg/m2  Musculoskeletal: Strength & Muscle Tone: within normal limits Gait & Station: She is using wheelchair her gait is still unsteady.   Patient leans: Front and Backward  Mental Status Examination;  Patient is  Casually dressed and groomed.  She is cooperative.  She maintained fair eye contact.  Today she is complaining of fibromyalgia pain.  She has difficulty walking and she need stick to help walking.  Her speech is slow but coherent.  Her thought processes slow but logical and goal-directed.  Her attention and concentration is fair.  She described her mood  euthymic and her affect is appropriate.  She denies any auditory or visual hallucination.  She denies any active or passive suicidal thoughts and homicidal thoughts.  There were no paranoia, delusions or any obsessive thoughts.  She has no tremors or shakes.  Her fund of knowledge is average.  Her thought process is slow but logical and goal-directed.  She is alert and oriented x3.  Her insight judgment and impulse control is okay.   Established Problem, Stable/Improving (1), Review of Psycho-Social Stressors (1), Review of Last Therapy Session (1) and Review of Medication Regimen & Side Effects (2)  Assessment: Axis I: Maj. depressive disorder, recurrent  Axis II: Deferred  Axis III:  Past Medical History  Diagnosis Date  . Hyperlipidemia   . Depression   . Neuralgic migraines   . Anxiety   . Cervicalgia   . Memory loss   . Lumbago   . Fibromyalgia   . Migraines   . Fracture of ankle, bimalleolar, left, closed 08/10/2014    Plan:  Patient is a stable on her current medication.   Patient is scheduled to see her specialist in few days to addressed fibromyalgia. She is taking 3 anti depressant.  She has no side effects including any tremors or shakes. I will continue her Celexa 20 mg  daily, BuSpar 15 mg twice a day , trazodone 200 mg at bedtime and Wellbutrin XL 300 mg daily.  Patient is getting Valium from her primary care physician. Patient does not want counseling.  Recommended to call us back if she has any question or any concern.  I will see her again in 3 months.   Julie Hinderliter T., MD 07/18/2015

## 2015-07-23 ENCOUNTER — Ambulatory Visit (INDEPENDENT_AMBULATORY_CARE_PROVIDER_SITE_OTHER): Payer: PPO | Admitting: Family Medicine

## 2015-07-23 ENCOUNTER — Encounter: Payer: Self-pay | Admitting: Family Medicine

## 2015-07-23 VITALS — BP 100/58 | HR 60 | Temp 98.2°F | Resp 16 | Ht 65.0 in | Wt 158.0 lb

## 2015-07-23 DIAGNOSIS — Z13820 Encounter for screening for osteoporosis: Secondary | ICD-10-CM | POA: Diagnosis not present

## 2015-07-23 DIAGNOSIS — E785 Hyperlipidemia, unspecified: Secondary | ICD-10-CM

## 2015-07-23 DIAGNOSIS — Z79899 Other long term (current) drug therapy: Secondary | ICD-10-CM | POA: Diagnosis not present

## 2015-07-23 DIAGNOSIS — R0989 Other specified symptoms and signs involving the circulatory and respiratory systems: Secondary | ICD-10-CM | POA: Diagnosis not present

## 2015-07-23 DIAGNOSIS — Z23 Encounter for immunization: Secondary | ICD-10-CM

## 2015-07-23 DIAGNOSIS — Z1231 Encounter for screening mammogram for malignant neoplasm of breast: Secondary | ICD-10-CM

## 2015-07-23 DIAGNOSIS — Z0000 Encounter for general adult medical examination without abnormal findings: Secondary | ICD-10-CM

## 2015-07-23 DIAGNOSIS — Z1239 Encounter for other screening for malignant neoplasm of breast: Secondary | ICD-10-CM

## 2015-07-23 MED ORDER — DIAZEPAM 5 MG PO TABS: 5.0000 mg | ORAL_TABLET | Freq: Two times a day (BID) | ORAL | Status: DC | PRN

## 2015-07-23 NOTE — Addendum Note (Signed)
Addended by: Shary Decamp B on: 07/23/2015 03:57 PM   Modules accepted: Orders

## 2015-07-23 NOTE — Addendum Note (Signed)
Addended by: Jenna Luo on: 07/23/2015 02:54 PM   Modules accepted: Orders

## 2015-07-23 NOTE — Progress Notes (Signed)
Subjective:    Patient ID: Julie Neal, female    DOB: 09/25/1955, 59 y.o.   MRN: [id number]  HPI   patient is here today for complete physical exam. Her last colonoscopy was in 2011 and is not due again until 2021. She is due for me to schedule her for mammogram as well as a bone density test. Past medical history is significant for a total abdominal hysterectomy and therefore she does not require a Pap smear. She is due for the flu shot as well as the shingles vaccine. Patient has a history of fibromyalgia , multiple back surgeries , chronic pain stemming from this. She currently sees a pain clinic  Pain clinic is prescribing MS Contin , and tizanidine. She is also seeing a psychiatrist is prescribing he Wellbutrin along with BuSpar. I have been prescribing her Valium for muscle spasms in her neck stemming from her cervical spine surgery.   I am concerned about polypharmacy. I have recommended that the patient discontinue BuSpar and tizanidine if she insists on taking Valium. She will agree Past Medical History  Diagnosis Date  . Hyperlipidemia   . Depression   . Neuralgic migraines   . Anxiety   . Cervicalgia   . Memory loss   . Lumbago   . Fibromyalgia   . Migraines   . Fracture of ankle, bimalleolar, left, closed 08/10/2014   Past Surgical History  Procedure Laterality Date  . Abdominal hysterectomy    . Back surgery    . Neck surgery    . Leg surgery Bilateral   . Hand surgery Bilateral   . Shoulder surgery Left    Current Outpatient Prescriptions on File Prior to Visit  Medication Sig Dispense Refill  . buPROPion (WELLBUTRIN XL) 300 MG 24 hr tablet Take 1 tablet (300 mg total) by mouth daily. 90 tablet 0  . busPIRone (BUSPAR) 15 MG tablet Take 1 tablet (15 mg total) by mouth 2 (two) times daily. 180 tablet 0  . citalopram (CELEXA) 20 MG tablet Take 1 tablet (20 mg total) by mouth daily. 90 tablet 0  . diazepam (VALIUM) 5 MG tablet Take 1 tablet (5 mg total) by mouth 2  (two) times daily as needed. 60 tablet 2  . tiZANidine (ZANAFLEX) 4 MG tablet Take 4 mg by mouth every 6 (six) hours as needed.     . traZODone (DESYREL) 100 MG tablet Take 2 tablets (200 mg total) by mouth at bedtime. 2 tabs po qhs 180 tablet 0   No current facility-administered medications on file prior to visit.   Allergies  Allergen Reactions  . Mobic [Meloxicam] Anaphylaxis  . Penicillins Anaphylaxis  . Nubain [Nalbuphine Hcl] Diarrhea and Nausea And Vomiting   Social History   Social History  . Marital Status: Married    Spouse Name: [name]  . Number of Children: 1  . Years of Education: college   Occupational History  .      disabled   Social History Main Topics  . Smoking status: Former Smoker    Types: Cigarettes  . Smokeless tobacco: Never Used     Comment: Quit in 1988  . Alcohol Use: No  . Drug Use: No  . Sexual Activity: Not on file   Other Topics Concern  . Not on file   Social History Narrative   Patient lives at home with her husband [name])  and has a college education. Patient has one child.   Patient is disabled. Right handed.  Caffeine one cup of coffee.   Family History  Problem Relation Age of Onset  . Heart attack Father   . Heart disease Father 1  . Alzheimer's disease Mother   . Stroke Mother   . Breast cancer Sister   . Diabetes type II Brother      Review of Systems  All other systems reviewed and are negative.      Objective:   Physical Exam  Constitutional: She is oriented to person, place, and time. She appears well-developed and well-nourished. No distress.  HENT:  Head: Normocephalic and atraumatic.  Right Ear: External ear normal.  Left Ear: External ear normal.  Nose: Nose normal.  Mouth/Throat: Oropharynx is clear and moist. No oropharyngeal exudate.  Eyes: Conjunctivae and EOM are normal. Pupils are equal, round, and reactive to light. Right eye exhibits no discharge. No scleral icterus.  Neck: Neck supple. No  JVD present. No tracheal deviation present. No thyromegaly present.  Cardiovascular: Normal rate, regular rhythm, normal heart sounds and intact distal pulses.  Exam reveals no gallop and no friction rub.   No murmur heard. Pulses:      Carotid pulses are on the right side with bruit. Pulmonary/Chest: Effort normal and breath sounds normal. No stridor. No respiratory distress. She has no wheezes. She has no rales. She exhibits no tenderness.  Abdominal: Soft. Bowel sounds are normal. She exhibits no distension and no mass. There is no tenderness. There is no rebound and no guarding.  Musculoskeletal: Normal range of motion. She exhibits tenderness. She exhibits no edema.  Lymphadenopathy:    She has no cervical adenopathy.  Neurological: She is alert and oriented to person, place, and time. She has normal reflexes. She displays normal reflexes. No cranial nerve deficit. She exhibits normal muscle tone. Coordination normal.  Skin: Skin is warm. No rash noted. She is not diaphoretic. No erythema. No pallor.  Psychiatric: She has a normal mood and affect. Her behavior is normal. Judgment and thought content normal.  Vitals reviewed.         Assessment & Plan:  HLD (hyperlipidemia) - Plan: CBC with Differential/Platelet, COMPLETE METABOLIC PANEL WITH GFR, Lipid panel  Routine general medical examination at a health care facility  Polypharmacy  Right carotid bruit - Plan: Carotid  Breast cancer screening, high risk patient - Plan: MM Digital Screening  Screening for osteoporosis - Plan: DG Bone Density   discontinue BuSpar and tizanidine if she is going to take Valium twice a day. Scheduled patient for a mammogram as well as a bone density test. Does not require colonoscopy or Pap smear. Patient received her flu shot today. I recommended a shingles vaccine. I did appreciate a right carotid bruit and therefore underwent a schedule the patient for carotid Dopplers. I recommended that she  begin taking aspirin 81 mg by mouth daily in the meantime.  If there is a blockage in the carotid artery, I will treat her cholesterol aggressively and lower her LDL cholesterol less than 70 using a statin

## 2015-07-24 LAB — COMPLETE METABOLIC PANEL WITH GFR
ALK PHOS: 62 U/L (ref 33–130)
ALT: 6 U/L (ref 6–29)
AST: 14 U/L (ref 10–35)
Albumin: 3.7 g/dL (ref 3.6–5.1)
BUN: 15 mg/dL (ref 7–25)
CO2: 31 mmol/L (ref 20–31)
CREATININE: 0.99 mg/dL (ref 0.50–0.99)
Calcium: 9.2 mg/dL (ref 8.6–10.4)
Chloride: 101 mmol/L (ref 98–110)
GFR, EST AFRICAN AMERICAN: 72 mL/min (ref >=60)
GFR, Est Non African American: 62 mL/min (ref >=60)
GLUCOSE: 92 mg/dL (ref 70–99)
Potassium: 4.6 mmol/L (ref 3.5–5.3)
SODIUM: 139 mmol/L (ref 135–146)
Total Bilirubin: 0.2 mg/dL (ref 0.2–1.2)
Total Protein: 6.5 g/dL (ref 6.1–8.1)

## 2015-07-24 LAB — LIPID PANEL
Cholesterol: 194 mg/dL (ref 125–200)
HDL: 33 mg/dL — ABNORMAL LOW (ref >=46)
LDL CALC: 133 mg/dL — AB (ref <130)
Total CHOL/HDL Ratio: 5.9 Ratio — ABNORMAL HIGH (ref <=5.0)
Triglycerides: 139 mg/dL (ref <150)
VLDL: 28 mg/dL (ref <30)

## 2015-07-24 LAB — CBC WITH DIFFERENTIAL/PLATELET
BASOS ABS: 0 10*3/uL (ref 0.0–0.1)
Basophils Relative: 0 % (ref 0–1)
EOS ABS: 0.1 10*3/uL (ref 0.0–0.7)
Eosinophils Relative: 2 % (ref 0–5)
HCT: 37.6 % (ref 36.0–46.0)
HEMOGLOBIN: 12.3 g/dL (ref 12.0–15.0)
Lymphocytes Relative: 37 % (ref 12–46)
Lymphs Abs: 1.7 10*3/uL (ref 0.7–4.0)
MCH: 28.9 pg (ref 26.0–34.0)
MCHC: 32.7 g/dL (ref 30.0–36.0)
MCV: 88.5 fL (ref 78.0–100.0)
MPV: 11.2 fL (ref 8.6–12.4)
Monocytes Absolute: 0.5 10*3/uL (ref 0.1–1.0)
Monocytes Relative: 10 % (ref 3–12)
NEUTROS PCT: 51 % (ref 43–77)
Neutro Abs: 2.3 10*3/uL (ref 1.7–7.7)
Platelets: 196 10*3/uL (ref 150–400)
RBC: 4.25 MIL/uL (ref 3.87–5.11)
RDW: 13.5 % (ref 11.5–15.5)
WBC: 4.6 10*3/uL (ref 4.0–10.5)

## 2015-07-26 ENCOUNTER — Other Ambulatory Visit (HOSPITAL_COMMUNITY): Payer: Self-pay | Admitting: Psychiatry

## 2015-07-30 ENCOUNTER — Telehealth: Payer: Self-pay | Admitting: Family Medicine

## 2015-07-30 NOTE — Telephone Encounter (Signed)
Pt aware of appt.

## 2015-07-30 NOTE — Telephone Encounter (Signed)
Patient has an appointment for Tuesday 07/31/15 at 10:00 am. She will need to go through the main entrance of [facility] to admissions they will check her in and someone will take her. Her [employer] authorization # is [id number]

## 2015-07-31 ENCOUNTER — Ambulatory Visit (HOSPITAL_COMMUNITY)
Admission: RE | Admit: 2015-07-31 | Discharge: 2015-07-31 | Disposition: A | Discharge status: Home / Self Care | Payer: PPO | Source: Ambulatory Visit | Attending: Family Medicine | Admitting: Family Medicine

## 2015-07-31 DIAGNOSIS — R51 Headache: Secondary | ICD-10-CM | POA: Diagnosis not present

## 2015-07-31 DIAGNOSIS — R0989 Other specified symptoms and signs involving the circulatory and respiratory systems: Secondary | ICD-10-CM

## 2015-07-31 DIAGNOSIS — I6521 Occlusion and stenosis of right carotid artery: Secondary | ICD-10-CM | POA: Insufficient documentation

## 2015-07-31 NOTE — Progress Notes (Addendum)
VASCULAR LAB PRELIMINARY  PRELIMINARY  PRELIMINARY  PRELIMINARY  Carotid duplex completed.    Preliminary report: Right - 1% to 39% ICA stenosis. Left - No evidence of ICA stenosis. Vertebral artery flow is antegrade.  Markos Theil, RVS 07/31/2015, 10:22 AM

## 2015-08-02 ENCOUNTER — Encounter: Payer: Self-pay | Admitting: Family Medicine

## 2015-08-02 ENCOUNTER — Other Ambulatory Visit: Payer: Self-pay | Admitting: Family Medicine

## 2015-08-02 DIAGNOSIS — E785 Hyperlipidemia, unspecified: Secondary | ICD-10-CM

## 2015-08-02 DIAGNOSIS — Z79899 Other long term (current) drug therapy: Secondary | ICD-10-CM

## 2015-08-02 DIAGNOSIS — I6521 Occlusion and stenosis of right carotid artery: Secondary | ICD-10-CM | POA: Insufficient documentation

## 2015-08-02 MED ORDER — ATORVASTATIN CALCIUM 20 MG PO TABS
20.0000 mg | ORAL_TABLET | Freq: Every day | ORAL | Status: DC
Start: 2015-08-02 — End: 2016-06-12

## 2015-08-06 ENCOUNTER — Other Ambulatory Visit (HOSPITAL_COMMUNITY): Payer: Self-pay | Admitting: Psychiatry

## 2015-08-07 ENCOUNTER — Other Ambulatory Visit: Payer: Self-pay | Admitting: Family Medicine

## 2015-08-07 DIAGNOSIS — F329 Major depressive disorder, single episode, unspecified: Secondary | ICD-10-CM

## 2015-08-07 DIAGNOSIS — F32A Depression, unspecified: Secondary | ICD-10-CM

## 2015-08-09 ENCOUNTER — Telehealth (HOSPITAL_COMMUNITY): Payer: Self-pay

## 2015-08-09 NOTE — Telephone Encounter (Signed)
Telephone call with patient to make sure she received Buspar order from [facility] as our office received a request for a new order from [facility] [phone].  Patient stated she had and it did not need to be sent to [facility].

## 2015-08-21 DIAGNOSIS — M47812 Spondylosis without myelopathy or radiculopathy, cervical region: Secondary | ICD-10-CM | POA: Diagnosis not present

## 2015-08-21 DIAGNOSIS — M62830 Muscle spasm of back: Secondary | ICD-10-CM | POA: Diagnosis not present

## 2015-08-21 DIAGNOSIS — M47817 Spondylosis without myelopathy or radiculopathy, lumbosacral region: Secondary | ICD-10-CM | POA: Diagnosis not present

## 2015-08-21 DIAGNOSIS — G894 Chronic pain syndrome: Secondary | ICD-10-CM | POA: Diagnosis not present

## 2015-09-07 ENCOUNTER — Ambulatory Visit
Admission: RE | Admit: 2015-09-07 | Discharge: 2015-09-07 | Disposition: A | Discharge status: Home / Self Care | Payer: PPO | Source: Ambulatory Visit | Attending: Family Medicine | Admitting: Family Medicine

## 2015-09-07 DIAGNOSIS — F32A Depression, unspecified: Secondary | ICD-10-CM

## 2015-09-07 DIAGNOSIS — Z1239 Encounter for other screening for malignant neoplasm of breast: Secondary | ICD-10-CM

## 2015-09-07 DIAGNOSIS — M810 Age-related osteoporosis without current pathological fracture: Secondary | ICD-10-CM | POA: Diagnosis not present

## 2015-09-07 DIAGNOSIS — Z1231 Encounter for screening mammogram for malignant neoplasm of breast: Secondary | ICD-10-CM | POA: Diagnosis not present

## 2015-09-07 DIAGNOSIS — F329 Major depressive disorder, single episode, unspecified: Secondary | ICD-10-CM

## 2015-09-10 ENCOUNTER — Encounter: Payer: Self-pay | Admitting: Family Medicine

## 2015-09-12 ENCOUNTER — Other Ambulatory Visit: Payer: Self-pay | Admitting: Family Medicine

## 2015-09-12 MED ORDER — ALENDRONATE SODIUM 70 MG PO TABS: 70.0000 mg | ORAL_TABLET | ORAL | Status: DC

## 2015-09-18 DIAGNOSIS — M47817 Spondylosis without myelopathy or radiculopathy, lumbosacral region: Secondary | ICD-10-CM | POA: Diagnosis not present

## 2015-09-18 DIAGNOSIS — G894 Chronic pain syndrome: Secondary | ICD-10-CM | POA: Diagnosis not present

## 2015-09-18 DIAGNOSIS — M62830 Muscle spasm of back: Secondary | ICD-10-CM | POA: Diagnosis not present

## 2015-09-18 DIAGNOSIS — M47812 Spondylosis without myelopathy or radiculopathy, cervical region: Secondary | ICD-10-CM | POA: Diagnosis not present

## 2015-10-09 ENCOUNTER — Other Ambulatory Visit (HOSPITAL_COMMUNITY): Payer: Self-pay | Admitting: Psychiatry

## 2015-10-12 ENCOUNTER — Other Ambulatory Visit (HOSPITAL_COMMUNITY): Payer: Self-pay | Admitting: Psychiatry

## 2015-10-16 DIAGNOSIS — G894 Chronic pain syndrome: Secondary | ICD-10-CM | POA: Diagnosis not present

## 2015-10-16 DIAGNOSIS — M62830 Muscle spasm of back: Secondary | ICD-10-CM | POA: Diagnosis not present

## 2015-10-16 DIAGNOSIS — M47812 Spondylosis without myelopathy or radiculopathy, cervical region: Secondary | ICD-10-CM | POA: Diagnosis not present

## 2015-10-16 DIAGNOSIS — M47817 Spondylosis without myelopathy or radiculopathy, lumbosacral region: Secondary | ICD-10-CM | POA: Diagnosis not present

## 2015-10-17 ENCOUNTER — Encounter (HOSPITAL_COMMUNITY): Payer: Self-pay | Admitting: Psychiatry

## 2015-10-17 ENCOUNTER — Ambulatory Visit (INDEPENDENT_AMBULATORY_CARE_PROVIDER_SITE_OTHER): Payer: PPO | Admitting: Psychiatry

## 2015-10-17 VITALS — BP 105/65 | HR 73 | Ht 66.0 in | Wt 149.4 lb

## 2015-10-17 DIAGNOSIS — F330 Major depressive disorder, recurrent, mild: Secondary | ICD-10-CM

## 2015-10-17 MED ORDER — TRAZODONE HCL 100 MG PO TABS: 200.0000 mg | ORAL_TABLET | Freq: Every day | ORAL | Status: DC

## 2015-10-17 MED ORDER — CITALOPRAM HYDROBROMIDE 20 MG PO TABS: 20.0000 mg | ORAL_TABLET | Freq: Every day | ORAL | Status: DC

## 2015-10-17 MED ORDER — BUSPIRONE HCL 15 MG PO TABS: 15.0000 mg | ORAL_TABLET | Freq: Two times a day (BID) | ORAL | Status: DC

## 2015-10-17 MED ORDER — BUPROPION HCL ER (XL) 300 MG PO TB24: 300.0000 mg | ORAL_TABLET | Freq: Every day | ORAL | Status: DC

## 2015-10-17 NOTE — Progress Notes (Signed)
[facility] [phone] Progress Note   Julie Neal [id number] 60 y.o.  10/17/2015 1:30 PM  Chief Complaint:  Medication management and follow-up.       History of Present Illness:  Julie Neal came for her followup appointment.  She is taking her medication as prescribed.  She recently twisted her right knee when she was coming off from the bed.  She is complaining of right knee joint pain.  She is taking pain medication.  She denies any major panic attack.  She is taking her psychiatric medication and feels it is working very well.  She denies any feeling of hopelessness or worthlessness.  She denies any irritability, anger, paranoia or any hopelessness.  Her energy level is okay.  She has no tremors shakes or any EPS.  She sleeps good with the trazodone.  Her appetite is okay.  Her vitals are stable.  She had a quite Christmas.  She lives with her ex-husband who is very supportive.  Patient denies drinking or using any illegal substances.  Suicidal Ideation: No Plan Formed: No Patient has means to carry out plan: No  Homicidal Ideation: No Plan Formed: No Patient has means to carry out plan: No  Past Psychiatric History/Hospitalization(s) Patient endorsed history of depression when her son drowned at age 67.  She was getting antidepressants from her primary care physician and then from Dr. Reece Levy.  She endorsed history of using cocaine in her early 74s .  She did not recall her previous medications.   Anxiety: Yes Bipolar Disorder: No Depression: Yes Mania: No Psychosis: No Schizophrenia: No Personality Disorder: No Hospitalization for psychiatric illness: No History of Electroconvulsive Shock Therapy: No Prior Suicide Attempts: No  Medical History; Patient has hyperlipidemia, neurologic migraine, memory impairment, Lumbago, fibromyalgia and migraine.  She has difficulty walking because of chronic pain.  Her primary care physician is Dr Dennard Schaumann, He neurologist is Dr Krista Blue and her  pain specialist is Dr. Florene Glen.  Review of Systems  Constitutional: Negative.   Musculoskeletal: Positive for joint pain.  Skin: Negative for itching and rash.  Neurological: Negative for tremors.  Psychiatric/Behavioral: Negative for hallucinations and substance abuse.    Psychiatric: Agitation: No Hallucination: No Depressed Mood: No Insomnia: No Hypersomnia: No Altered Concentration: No Feels Worthless: No Grandiose Ideas: No Belief In Special Powers: No New/Increased Substance Abuse: No Compulsions: No  Neurologic: Headache: Yes Seizure: Patient endorsed seizure-like activity twice in the past. Paresthesias: No    Outpatient Encounter Prescriptions as of 10/17/2015  Medication Sig  . alendronate (FOSAMAX) 70 MG tablet Take 1 tablet (70 mg total) by mouth every 7 (seven) days. Take with a full glass of water on an empty stomach.  Marland Kitchen atorvastatin (LIPITOR) 20 MG tablet Take 1 tablet (20 mg total) by mouth daily.  Marland Kitchen buPROPion (WELLBUTRIN XL) 300 MG 24 hr tablet Take 1 tablet (300 mg total) by mouth daily.  . busPIRone (BUSPAR) 15 MG tablet Take 1 tablet (15 mg total) by mouth 2 (two) times daily.  . citalopram (CELEXA) 20 MG tablet Take 1 tablet (20 mg total) by mouth daily.  . diazepam (VALIUM) 5 MG tablet Take 1 tablet (5 mg total) by mouth 2 (two) times daily as needed.  Marland Kitchen morphine (MSIR) 15 MG tablet Take 15 mg by mouth 4 (four) times daily.  Marland Kitchen tiZANidine (ZANAFLEX) 4 MG tablet Take 4 mg by mouth every 6 (six) hours as needed.   . traZODone (DESYREL) 100 MG tablet Take 2 tablets (200 mg  total) by mouth at bedtime.  . [DISCONTINUED] buPROPion (WELLBUTRIN XL) 300 MG 24 hr tablet Take 1 tablet (300 mg total) by mouth daily.  . [DISCONTINUED] busPIRone (BUSPAR) 15 MG tablet Take 1 tablet (15 mg total) by mouth 2 (two) times daily.  . [DISCONTINUED] citalopram (CELEXA) 20 MG tablet Take 1 tablet (20 mg total) by mouth daily.  . [DISCONTINUED] traZODone (DESYREL) 100 MG tablet  Take 2 tablets (200 mg total) by mouth at bedtime. 2 tabs po qhs   No facility-administered encounter medications on file as of 10/17/2015.    Recent Results (from the past 2160 hour(s))  CBC with Differential/Platelet     Status: None   Collection Time: 07/23/15  2:44 PM  Result Value Ref Range   WBC 4.6 4.0 - 10.5 K/uL   RBC 4.25 3.87 - 5.11 MIL/uL   Hemoglobin 12.3 12.0 - 15.0 g/dL   HCT 37.6 36.0 - 46.0 %   MCV 88.5 78.0 - 100.0 fL   MCH 28.9 26.0 - 34.0 pg   MCHC 32.7 30.0 - 36.0 g/dL   RDW 13.5 11.5 - 15.5 %   Platelets 196 150 - 400 K/uL   MPV 11.2 8.6 - 12.4 fL   Neutrophils Relative % 51 43 - 77 %   Neutro Abs 2.3 1.7 - 7.7 K/uL   Lymphocytes Relative 37 12 - 46 %   Lymphs Abs 1.7 0.7 - 4.0 K/uL   Monocytes Relative 10 3 - 12 %   Monocytes Absolute 0.5 0.1 - 1.0 K/uL   Eosinophils Relative 2 0 - 5 %   Eosinophils Absolute 0.1 0.0 - 0.7 K/uL   Basophils Relative 0 0 - 1 %   Basophils Absolute 0.0 0.0 - 0.1 K/uL   Smear Review Criteria for review not met   COMPLETE METABOLIC PANEL WITH GFR     Status: None   Collection Time: 07/23/15  2:44 PM  Result Value Ref Range   Sodium 139 135 - 146 mmol/L   Potassium 4.6 3.5 - 5.3 mmol/L   Chloride 101 98 - 110 mmol/L   CO2 31 20 - 31 mmol/L   Glucose, Bld 92 70 - 99 mg/dL   BUN 15 7 - 25 mg/dL   Creat 0.99 0.50 - 0.99 mg/dL   Total Bilirubin 0.2 0.2 - 1.2 mg/dL   Alkaline Phosphatase 62 33 - 130 U/L   AST 14 10 - 35 U/L   ALT 6 6 - 29 U/L   Total Protein 6.5 6.1 - 8.1 g/dL   Albumin 3.7 3.6 - 5.1 g/dL   Calcium 9.2 8.6 - 10.4 mg/dL   GFR, Est African American 72 >=60 mL/min   GFR, Est Non African American 62 >=60 mL/min    Comment:   The estimated GFR is a calculation valid for adults (>=39 years old) that uses the CKD-EPI algorithm to adjust for age and sex. It is   not to be used for children, pregnant women, hospitalized patients,    patients on dialysis, or with rapidly changing kidney function. According to the  NKDEP, eGFR >89 is normal, 60-89 shows mild impairment, 30-59 shows moderate impairment, 15-29 shows severe impairment and <15 is ESRD.     Lipid panel     Status: Abnormal   Collection Time: 07/23/15  2:44 PM  Result Value Ref Range   Cholesterol 194 125 - 200 mg/dL   Triglycerides 139 <150 mg/dL   HDL 33 (L) >=46 mg/dL   Total CHOL/HDL  Ratio 5.9 (H) <=5.0 Ratio   VLDL 28 <30 mg/dL   LDL Cholesterol 133 (H) <130 mg/dL    Comment:   Total Cholesterol/HDL Ratio:CHD Risk                        Coronary Heart Disease Risk Table                                        Men       Women          1/2 Average Risk              3.4        3.3              Average Risk              5.0        4.4           2X Average Risk              9.6        7.1           3X Average Risk             23.4       11.0 Use the calculated Patient Ratio above and the CHD Risk table  to determine the patient's CHD Risk.       Physical Exam: Constitutional:  BP 105/65 mmHg  Pulse 73  Ht 5' 6" (1.676 m)  Wt 149 lb 6.4 oz (67.767 kg)  BMI 24.13 kg/m2  Musculoskeletal: Strength & Muscle Tone: within normal limits Gait & Station: She is using wheelchair her gait is still unsteady.   Patient leans: Front and Backward  Mental Status Examination;  Patient is casually dressed and groomed.  She is cooperative.  She maintained fair eye contact.  She has difficulty walking and she need stick to help walking.  Her speech is slow but coherent.  Her thought processes slow but logical and goal-directed.  Her attention and concentration is fair.  She described her mood  euthymic and her affect is appropriate.  She denies any auditory or visual hallucination.  She denies any active or passive suicidal thoughts and homicidal thoughts.  There were no paranoia, delusions or any obsessive thoughts.  She has no tremors or shakes.  Her fund of knowledge is average.  Her thought process is slow but logical and goal-directed.  She  is alert and oriented x3.  Her insight judgment and impulse control is okay.   Established Problem, Stable/Improving (1), Review of Psycho-Social Stressors (1), Review of Last Therapy Session (1) and Review of Medication Regimen & Side Effects (2)  Assessment: Axis I: Maj. depressive disorder, recurrent  Axis II: Deferred  Axis III:  Past Medical History  Diagnosis Date  . Hyperlipidemia   . Depression   . Neuralgic migraines   . Anxiety   . Cervicalgia   . Memory loss   . Lumbago   . Fibromyalgia   . Migraines   . Fracture of ankle, bimalleolar, left, closed 08/10/2014  . Asymptomatic stenosis of right carotid artery     1-30%  . Osteoporosis     Plan:  Patient is a stable on her current medication.   She is taking 3 anti depressant.  She has no side  effects including any tremors or shakes. I will continue her Celexa 20 mg daily, BuSpar 15 mg twice a day , trazodone 200 mg at bedtime and Wellbutrin XL 300 mg daily.  Patient is getting Valium and morphine from her primary care physician. Patient does not want counseling.  Recommended to call us back if she has any question or any concern.  I will see her again in 3 months.   ARFEEN,SYED T., MD 10/17/2015

## 2015-10-22 DIAGNOSIS — M1710 Unilateral primary osteoarthritis, unspecified knee: Secondary | ICD-10-CM | POA: Diagnosis not present

## 2015-10-22 DIAGNOSIS — M1711 Unilateral primary osteoarthritis, right knee: Secondary | ICD-10-CM | POA: Diagnosis not present

## 2015-10-22 DIAGNOSIS — M170 Bilateral primary osteoarthritis of knee: Secondary | ICD-10-CM | POA: Diagnosis not present

## 2015-10-22 DIAGNOSIS — M79642 Pain in left hand: Secondary | ICD-10-CM | POA: Diagnosis not present

## 2015-10-22 DIAGNOSIS — M25512 Pain in left shoulder: Secondary | ICD-10-CM | POA: Diagnosis not present

## 2015-10-22 DIAGNOSIS — M25561 Pain in right knee: Secondary | ICD-10-CM | POA: Diagnosis not present

## 2015-10-22 DIAGNOSIS — M7542 Impingement syndrome of left shoulder: Secondary | ICD-10-CM | POA: Diagnosis not present

## 2015-10-25 ENCOUNTER — Telehealth: Payer: Self-pay | Admitting: Family Medicine

## 2015-10-25 MED ORDER — DIAZEPAM 5 MG PO TABS: 5.0000 mg | ORAL_TABLET | Freq: Two times a day (BID) | ORAL | Status: DC | PRN

## 2015-10-25 NOTE — Telephone Encounter (Signed)
Requesting refill for valium to go to [facility] - ? OK to Refill - last RF 07/27/15 #60/0

## 2015-10-25 NOTE — Telephone Encounter (Signed)
Rx faxed to pharm

## 2015-10-25 NOTE — Telephone Encounter (Signed)
ok 

## 2015-11-05 ENCOUNTER — Telehealth: Payer: Self-pay | Admitting: Family Medicine

## 2015-11-05 NOTE — Telephone Encounter (Signed)
Spoke to pt and [facility] only sent her 16 tablets with 2 refills. Informed pt to call and see what the problem was that we [facility] in #90/[facility]. She will call and if I can do anything for her she will call me back.

## 2015-11-05 NOTE — Telephone Encounter (Signed)
Patient is calling to speak to you regarding her atrovastin, and the qty she got in the mail  [phone]

## 2015-11-19 DIAGNOSIS — M62830 Muscle spasm of back: Secondary | ICD-10-CM | POA: Diagnosis not present

## 2015-11-19 DIAGNOSIS — Z79891 Long term (current) use of opiate analgesic: Secondary | ICD-10-CM | POA: Diagnosis not present

## 2015-11-19 DIAGNOSIS — M47817 Spondylosis without myelopathy or radiculopathy, lumbosacral region: Secondary | ICD-10-CM | POA: Diagnosis not present

## 2015-11-19 DIAGNOSIS — G894 Chronic pain syndrome: Secondary | ICD-10-CM | POA: Diagnosis not present

## 2015-11-19 DIAGNOSIS — M47812 Spondylosis without myelopathy or radiculopathy, cervical region: Secondary | ICD-10-CM | POA: Diagnosis not present

## 2015-12-17 DIAGNOSIS — M47812 Spondylosis without myelopathy or radiculopathy, cervical region: Secondary | ICD-10-CM | POA: Diagnosis not present

## 2015-12-17 DIAGNOSIS — G894 Chronic pain syndrome: Secondary | ICD-10-CM | POA: Diagnosis not present

## 2015-12-17 DIAGNOSIS — M47817 Spondylosis without myelopathy or radiculopathy, lumbosacral region: Secondary | ICD-10-CM | POA: Diagnosis not present

## 2015-12-17 DIAGNOSIS — M62830 Muscle spasm of back: Secondary | ICD-10-CM | POA: Diagnosis not present

## 2016-01-14 DIAGNOSIS — G894 Chronic pain syndrome: Secondary | ICD-10-CM | POA: Diagnosis not present

## 2016-01-14 DIAGNOSIS — M62830 Muscle spasm of back: Secondary | ICD-10-CM | POA: Diagnosis not present

## 2016-01-14 DIAGNOSIS — M47812 Spondylosis without myelopathy or radiculopathy, cervical region: Secondary | ICD-10-CM | POA: Diagnosis not present

## 2016-01-14 DIAGNOSIS — M47817 Spondylosis without myelopathy or radiculopathy, lumbosacral region: Secondary | ICD-10-CM | POA: Diagnosis not present

## 2016-01-17 ENCOUNTER — Encounter (HOSPITAL_COMMUNITY): Payer: Self-pay | Admitting: Psychiatry

## 2016-01-17 ENCOUNTER — Ambulatory Visit (INDEPENDENT_AMBULATORY_CARE_PROVIDER_SITE_OTHER): Payer: PPO | Admitting: Psychiatry

## 2016-01-17 VITALS — BP 116/68 | HR 57 | Ht 66.0 in | Wt 147.8 lb

## 2016-01-17 DIAGNOSIS — F330 Major depressive disorder, recurrent, mild: Secondary | ICD-10-CM

## 2016-01-17 MED ORDER — BUPROPION HCL ER (XL) 300 MG PO TB24: 300.0000 mg | ORAL_TABLET | Freq: Every day | ORAL | Status: DC

## 2016-01-17 MED ORDER — BUSPIRONE HCL 10 MG PO TABS: 10.0000 mg | ORAL_TABLET | Freq: Four times a day (QID) | ORAL | Status: DC

## 2016-01-17 MED ORDER — TRAZODONE HCL 100 MG PO TABS: 200.0000 mg | ORAL_TABLET | Freq: Every day | ORAL | Status: DC

## 2016-01-17 NOTE — Progress Notes (Signed)
[facility] Progress Note   MIKINLEY RYLE [id number] 60 y.o.  01/17/2016 2:34 PM  Chief Complaint:  I am doing fine but sometime I have tremors in my hands.  I still have pain.         History of Present Illness:  Julie Neal came for her followup appointment.  She is compliant with the medication and believes her depression and anxiety is under control.  Sometimes she has tremors in her hand but no other side effects.  She is taking Wellbutrin, Celexa, trazodone and BuSpar.  She is also taking Valium, muscle relaxant and morphine from other provider.  She denies any major panic attack and she believe her energy level is fair.  She denies any irritability, anger, feelings of hopelessness or worthlessness.  Her appetite is fair.  She saw her pain specialist 3 days ago and her pain is on and off.  She is taking pain medicine but sometime she feels pain is getting out of control.  She lives with her ex husband who is also very supportive.  Patient denies drinking alcohol or using any illegal substances.  Her appetite is okay.  Her vital signs are stable.  Suicidal Ideation: No Plan Formed: No Patient has means to carry out plan: No  Homicidal Ideation: No Plan Formed: No Patient has means to carry out plan: No  Past Psychiatric History/Hospitalization(s) Patient endorsed history of depression when her son drowned at age 76.  She was getting antidepressants from her primary care physician and then from Dr. Reece Levy.  She endorsed history of using cocaine in her early 60s .  She did not recall her previous medications.   Anxiety: Yes Bipolar Disorder: No Depression: Yes Mania: No Psychosis: No Schizophrenia: No Personality Disorder: No Hospitalization for psychiatric illness: No History of Electroconvulsive Shock Therapy: No Prior Suicide Attempts: No  Medical History; Patient has hyperlipidemia, neurologic migraine, memory impairment, Lumbago, fibromyalgia and migraine.  She has  difficulty walking because of chronic pain.  Her primary care physician is Dr Dennard Schaumann, He neurologist is Dr Krista Blue and her pain specialist is Dr. Florene Glen.  Review of Systems  Constitutional: Negative.   Musculoskeletal: Positive for back pain and joint pain.  Skin: Negative for itching and rash.  Neurological: Positive for tremors.  Psychiatric/Behavioral: Negative for hallucinations and substance abuse.    Psychiatric: Agitation: No Hallucination: No Depressed Mood: No Insomnia: No Hypersomnia: No Altered Concentration: No Feels Worthless: No Grandiose Ideas: No Belief In Special Powers: No New/Increased Substance Abuse: No Compulsions: No  Neurologic: Headache: Yes Seizure: Patient endorsed seizure-like activity twice in the past. Paresthesias: No    Outpatient Encounter Prescriptions as of 01/17/2016  Medication Sig  . alendronate (FOSAMAX) 70 MG tablet Take 1 tablet (70 mg total) by mouth every 7 (seven) days. Take with a full glass of water on an empty stomach.  Marland Kitchen atorvastatin (LIPITOR) 20 MG tablet Take 1 tablet (20 mg total) by mouth daily.  Marland Kitchen buPROPion (WELLBUTRIN XL) 300 MG 24 hr tablet Take 1 tablet (300 mg total) by mouth daily.  . busPIRone (BUSPAR) 10 MG tablet Take 1 tablet (10 mg total) by mouth 4 (four) times daily.  . diazepam (VALIUM) 5 MG tablet Take 1 tablet (5 mg total) by mouth 2 (two) times daily as needed.  Marland Kitchen morphine (MSIR) 15 MG tablet Take 15 mg by mouth 4 (four) times daily.  Marland Kitchen tiZANidine (ZANAFLEX) 4 MG tablet Take 4 mg by mouth every 6 (six) hours as  needed.   . traZODone (DESYREL) 100 MG tablet Take 2 tablets (200 mg total) by mouth at bedtime.  . [DISCONTINUED] buPROPion (WELLBUTRIN XL) 300 MG 24 hr tablet Take 1 tablet (300 mg total) by mouth daily.  . [DISCONTINUED] busPIRone (BUSPAR) 15 MG tablet Take 1 tablet (15 mg total) by mouth 2 (two) times daily.  . [DISCONTINUED] citalopram (CELEXA) 20 MG tablet Take 1 tablet (20 mg total) by mouth daily.   . [DISCONTINUED] traZODone (DESYREL) 100 MG tablet Take 2 tablets (200 mg total) by mouth at bedtime.   No facility-administered encounter medications on file as of 01/17/2016.    No results found for this or any previous visit (from the past 2160 hour(s)).    Physical Exam: Constitutional:  BP 120/74 mmHg  Pulse 72  Ht 5\' 3"  (1.6 m)  Wt 165 lb 6.4 oz (75.025 kg)  BMI 29.31 kg/m2  Musculoskeletal: Strength & Muscle Tone: within normal limits Gait & Station: unsteady she uses stick to help her balance. Patient leans: Front and Backward  Mental Status Examination;  Patient is casually dressed and groomed.  She is cooperative and maintained fair eye contact.  She has difficulty walking and she need stick to help walking.  Her speech is slow but coherent.  Her thought processes slow but logical and goal-directed.  Her attention and concentration is fair.  She described her mood  euthymic and her affect is appropriate.  She denies any auditory or visual hallucination.  She denies any active or passive suicidal thoughts and homicidal thoughts.  There were no paranoia, delusions or any obsessive thoughts.  She has no tremors or shakes.  Her fund of knowledge is average.  Her thought process is slow but logical and goal-directed.  She is alert and oriented x3.  Her insight judgment and impulse control is okay.   Established Problem, Stable/Improving (1), Review of Psycho-Social Stressors (1), Review and summation of old records (2), New Problem, with no additional work-up planned (3), Review of Last Therapy Session (1), Review of Medication Regimen & Side Effects (2) and Review of New Medication or Change in Dosage (2)  Assessment: Axis I: Maj. depressive disorder, recurrent  Axis II: Deferred  Axis III:  Past Medical History  Diagnosis Date  . Hyperlipidemia   . Depression   . Neuralgic migraines   . Anxiety   . Cervicalgia   . Memory loss   . Lumbago   . Fibromyalgia   .  Migraines   . Fracture of ankle, bimalleolar, left, closed 08/10/2014  . Asymptomatic stenosis of right carotid artery     1-30%  . Osteoporosis     Plan:  I review her records, collateral information and current medication.  She has mild tremors in her hand which could be due to multiple antidepressant.  We talk about stopping the Celexa and increasing the BuSpar to help anxiety.  She will continue Wellbutrin and trazodone .  Patient agreed with the plan.  I recommended to call us back if she feels symptoms are getting worse.  I will continue trazodone 200 mg at bedtime, Wellbutrin XL 300 mg daily, change in BuSpar and she will take 10 mg 4 times a day.  He will discontinue Celexa. Patient is getting Valium and morphine from her primary care physician. Patient does not want counseling. Discuss safety plan that anytime having active suicidal thoughts or homicidal thoughts then she need to call 911 or go to the local emergency room.  I will  see her again in 3 months.   Crandall Harvel T., MD 01/17/2016

## 2016-01-29 ENCOUNTER — Other Ambulatory Visit: Payer: Self-pay | Admitting: Family Medicine

## 2016-01-29 MED ORDER — DIAZEPAM 5 MG PO TABS: 5.0000 mg | ORAL_TABLET | Freq: Two times a day (BID) | ORAL | Status: DC | PRN

## 2016-01-29 NOTE — Telephone Encounter (Signed)
rx faxed to [facility]

## 2016-01-29 NOTE — Telephone Encounter (Signed)
LRF 10/25/15 #60 + 2  LOV 07/23/15  OK refill?

## 2016-01-29 NOTE — Telephone Encounter (Signed)
ok 

## 2016-01-30 ENCOUNTER — Telehealth: Payer: Self-pay | Admitting: Family Medicine

## 2016-01-30 DIAGNOSIS — T85848A Pain due to other internal prosthetic devices, implants and grafts, initial encounter: Secondary | ICD-10-CM | POA: Diagnosis not present

## 2016-01-30 NOTE — Telephone Encounter (Signed)
[facility] is calling to request a refill of Diazepam 5 mg.

## 2016-01-31 NOTE — Telephone Encounter (Signed)
This was faxed in on 01/29/16

## 2016-02-01 ENCOUNTER — Ambulatory Visit (INDEPENDENT_AMBULATORY_CARE_PROVIDER_SITE_OTHER): Payer: PPO | Admitting: Family Medicine

## 2016-02-01 ENCOUNTER — Encounter: Payer: Self-pay | Admitting: Family Medicine

## 2016-02-01 VITALS — BP 90/60 | HR 76 | Temp 98.3°F | Resp 16 | Wt 145.0 lb

## 2016-02-01 DIAGNOSIS — Z01818 Encounter for other preprocedural examination: Secondary | ICD-10-CM | POA: Diagnosis not present

## 2016-02-01 NOTE — Progress Notes (Signed)
Subjective:    Patient ID: Julie Neal, female    DOB: Dec 22, 1955, 60 y.o.   MRN: [id number]  HPI  Patient is here today for preop surgical evaluation. Patient has a history of spinal cord stimulators in the lower lumbar spine near the SI joints. Apparently these are causing pressure sores near the SI joints. Her back specialist has recommended surgical removal of these devices. On examination today, the patient has a 1.5 cm pressure sore with surrounding pink erythema near the right SI joint overlying where the battery for the spinal cord stimulator is located. She is ready to proceed with the surgery which is scheduled for July 10. Patient denies any signs or symptoms of unstable angina. She denies any chest pain shortness of breath or dyspnea on exertion. She denies any orthopnea. She has no history of congestive heart failure and there is no evidence on her exam of decompensated congestive heart failure. Last lab work was checked in December but there is no history or signs of hepatic dysfunction or chronic kidney disease. She also has no history of anemia. She denies any bleeding or bruising. Past Medical History  Diagnosis Date  . Hyperlipidemia   . Depression   . Neuralgic migraines   . Anxiety   . Cervicalgia   . Memory loss   . Lumbago   . Fibromyalgia   . Migraines   . Fracture of ankle, bimalleolar, left, closed 08/10/2014  . Asymptomatic stenosis of right carotid artery     1-30%  . Osteoporosis    Past Surgical History  Procedure Laterality Date  . Abdominal hysterectomy    . Back surgery    . Neck surgery    . Leg surgery Bilateral   . Hand surgery Bilateral   . Shoulder surgery Left    Current Outpatient Prescriptions on File Prior to Visit  Medication Sig Dispense Refill  . alendronate (FOSAMAX) 70 MG tablet Take 1 tablet (70 mg total) by mouth every 7 (seven) days. Take with a full glass of water on an empty stomach. 12 tablet 8  . atorvastatin (LIPITOR) 20 MG  tablet Take 1 tablet (20 mg total) by mouth daily. 90 tablet 3  . buPROPion (WELLBUTRIN XL) 300 MG 24 hr tablet Take 1 tablet (300 mg total) by mouth daily. 90 tablet 0  . busPIRone (BUSPAR) 10 MG tablet Take 1 tablet (10 mg total) by mouth 4 (four) times daily. 360 tablet 0  . diazepam (VALIUM) 5 MG tablet Take 1 tablet (5 mg total) by mouth 2 (two) times daily as needed. 60 tablet 2  . morphine (MSIR) 15 MG tablet Take 15 mg by mouth 4 (four) times daily.    Marland Kitchen tiZANidine (ZANAFLEX) 4 MG tablet Take 4 mg by mouth every 6 (six) hours as needed.     . traZODone (DESYREL) 100 MG tablet Take 2 tablets (200 mg total) by mouth at bedtime. 180 tablet 0   No current facility-administered medications on file prior to visit.   Allergies  Allergen Reactions  . Mobic [Meloxicam] Anaphylaxis  . Penicillins Anaphylaxis  . Nubain [Nalbuphine Hcl] Diarrhea and Nausea And Vomiting   Social History   Social History  . Marital Status: Married    Spouse Name: [name]  . Number of Children: 1  . Years of Education: college   Occupational History  .      disabled   Social History Main Topics  . Smoking status: Former Smoker    Types:  Cigarettes  . Smokeless tobacco: Never Used     Comment: Quit in 1988  . Alcohol Use: No  . Drug Use: No  . Sexual Activity: Not on file   Other Topics Concern  . Not on file   Social History Narrative   Patient lives at home with her husband [name])  and has a college education. Patient has one child.   Patient is disabled. Right handed.   Caffeine one cup of coffee.     Review of Systems  All other systems reviewed and are negative.      Objective:   Physical Exam  Constitutional: She is oriented to person, place, and time. She appears well-developed and well-nourished.  HENT:  Right Ear: External ear normal.  Left Ear: External ear normal.  Nose: Nose normal.  Mouth/Throat: Oropharynx is clear and moist. No oropharyngeal exudate.  Eyes:  Conjunctivae are normal. No scleral icterus.  Neck: Neck supple. No JVD present.  Cardiovascular: Normal rate, regular rhythm and normal heart sounds.  Exam reveals no gallop and no friction rub.   No murmur heard. Pulmonary/Chest: Effort normal and breath sounds normal. No respiratory distress. She has no wheezes. She has no rales. She exhibits no tenderness.  Abdominal: Soft. Bowel sounds are normal. She exhibits no distension. There is no tenderness. There is no rebound and no guarding.  Musculoskeletal: She exhibits no edema.  Lymphadenopathy:    She has no cervical adenopathy.  Neurological: She is alert and oriented to person, place, and time. No cranial nerve deficit.  Skin: Rash noted. There is erythema.  Vitals reviewed.         Assessment & Plan:  Preop exam for internal medicine - Plan: CBC with Differential/Platelet, COMPLETE METABOLIC PANEL WITH GFR  Patient's examination today reveals no contraindications for her elective surgery. Therefore she has medical clearance to proceed. I will check a CBC to rule out anemia as well as a CMP to evaluate for hepatic dysfunction or kidney disease. Assuming that her lab work is normal she can proceed with surgery

## 2016-02-02 LAB — COMPLETE METABOLIC PANEL WITH GFR
ALK PHOS: 52 U/L (ref 33–130)
ALT: 10 U/L (ref 6–29)
AST: 20 U/L (ref 10–35)
Albumin: 4.1 g/dL (ref 3.6–5.1)
BILIRUBIN TOTAL: 0.5 mg/dL (ref 0.2–1.2)
BUN: 9 mg/dL (ref 7–25)
CALCIUM: 8.9 mg/dL (ref 8.6–10.4)
CO2: 31 mmol/L (ref 20–31)
CREATININE: 0.93 mg/dL (ref 0.50–0.99)
Chloride: 105 mmol/L (ref 98–110)
GFR, EST AFRICAN AMERICAN: 77 mL/min (ref >=60)
GFR, Est Non African American: 67 mL/min (ref >=60)
Glucose, Bld: 76 mg/dL (ref 70–99)
Potassium: 4.4 mmol/L (ref 3.5–5.3)
Sodium: 139 mmol/L (ref 135–146)
TOTAL PROTEIN: 6.8 g/dL (ref 6.1–8.1)

## 2016-02-02 LAB — CBC WITH DIFFERENTIAL/PLATELET
BASOS ABS: 42 {cells}/uL (ref 0–200)
BASOS PCT: 1 %
EOS ABS: 168 {cells}/uL (ref 15–500)
Eosinophils Relative: 4 %
HEMATOCRIT: 37.8 % (ref 35.0–45.0)
Hemoglobin: 12.4 g/dL (ref 12.0–15.0)
LYMPHS PCT: 33 %
Lymphs Abs: 1386 cells/uL (ref 850–3900)
MCH: 29.2 pg (ref 27.0–33.0)
MCHC: 32.8 g/dL (ref 32.0–36.0)
MCV: 89.2 fL (ref 80.0–100.0)
MONO ABS: 294 {cells}/uL (ref 200–950)
MPV: 10.7 fL (ref 7.5–12.5)
Monocytes Relative: 7 %
Neutro Abs: 2310 cells/uL (ref 1500–7800)
Neutrophils Relative %: 55 %
Platelets: 165 10*3/uL (ref 140–400)
RBC: 4.24 MIL/uL (ref 3.80–5.10)
RDW: 13.9 % (ref 11.0–15.0)
WBC: 4.2 10*3/uL (ref 3.8–10.8)

## 2016-02-15 ENCOUNTER — Other Ambulatory Visit (HOSPITAL_COMMUNITY): Payer: Self-pay | Admitting: Psychiatry

## 2016-02-18 DIAGNOSIS — G8929 Other chronic pain: Secondary | ICD-10-CM | POA: Diagnosis not present

## 2016-02-18 DIAGNOSIS — T85193A Other mechanical complication of implanted electronic neurostimulator, generator, initial encounter: Secondary | ICD-10-CM | POA: Diagnosis not present

## 2016-02-18 DIAGNOSIS — T85112A Breakdown (mechanical) of implanted electronic neurostimulator (electrode) of spinal cord, initial encounter: Secondary | ICD-10-CM | POA: Diagnosis not present

## 2016-03-13 DIAGNOSIS — M47817 Spondylosis without myelopathy or radiculopathy, lumbosacral region: Secondary | ICD-10-CM | POA: Diagnosis not present

## 2016-03-13 DIAGNOSIS — M62830 Muscle spasm of back: Secondary | ICD-10-CM | POA: Diagnosis not present

## 2016-03-13 DIAGNOSIS — M47812 Spondylosis without myelopathy or radiculopathy, cervical region: Secondary | ICD-10-CM | POA: Diagnosis not present

## 2016-03-13 DIAGNOSIS — G894 Chronic pain syndrome: Secondary | ICD-10-CM | POA: Diagnosis not present

## 2016-03-15 ENCOUNTER — Other Ambulatory Visit (HOSPITAL_COMMUNITY): Payer: Self-pay | Admitting: Psychiatry

## 2016-03-15 DIAGNOSIS — F330 Major depressive disorder, recurrent, mild: Secondary | ICD-10-CM

## 2016-03-27 ENCOUNTER — Telehealth: Payer: Self-pay | Admitting: Family Medicine

## 2016-03-27 MED ORDER — DIAZEPAM 5 MG PO TABS: 5.0000 mg | ORAL_TABLET | Freq: Two times a day (BID) | ORAL | 3 refills | Status: DC | PRN

## 2016-03-27 NOTE — Telephone Encounter (Signed)
Rx printed, signed and faxed to [facility]

## 2016-03-27 NOTE — Telephone Encounter (Signed)
Pt called [facility] requesting a refill on her Diazepam and would like to know if we can put refills on it as well so she does not have to call every month??

## 2016-03-27 NOTE — Telephone Encounter (Signed)
Ok with refill x 3

## 2016-04-16 DIAGNOSIS — M47812 Spondylosis without myelopathy or radiculopathy, cervical region: Secondary | ICD-10-CM | POA: Diagnosis not present

## 2016-04-16 DIAGNOSIS — M791 Myalgia: Secondary | ICD-10-CM | POA: Diagnosis not present

## 2016-04-16 DIAGNOSIS — M47817 Spondylosis without myelopathy or radiculopathy, lumbosacral region: Secondary | ICD-10-CM | POA: Diagnosis not present

## 2016-04-16 DIAGNOSIS — G894 Chronic pain syndrome: Secondary | ICD-10-CM | POA: Diagnosis not present

## 2016-04-16 DIAGNOSIS — M62830 Muscle spasm of back: Secondary | ICD-10-CM | POA: Diagnosis not present

## 2016-04-21 ENCOUNTER — Ambulatory Visit (HOSPITAL_COMMUNITY): Payer: Self-pay | Admitting: Psychiatry

## 2016-04-30 ENCOUNTER — Other Ambulatory Visit (HOSPITAL_COMMUNITY): Payer: Self-pay

## 2016-04-30 DIAGNOSIS — F330 Major depressive disorder, recurrent, mild: Secondary | ICD-10-CM

## 2016-04-30 MED ORDER — BUPROPION HCL ER (XL) 300 MG PO TB24: 300.0000 mg | ORAL_TABLET | Freq: Every day | ORAL | 0 refills | Status: DC

## 2016-04-30 MED ORDER — TRAZODONE HCL 100 MG PO TABS: 200.0000 mg | ORAL_TABLET | Freq: Every day | ORAL | 0 refills | Status: DC

## 2016-04-30 MED ORDER — BUSPIRONE HCL 10 MG PO TABS: 10.0000 mg | ORAL_TABLET | Freq: Four times a day (QID) | ORAL | 0 refills | Status: DC

## 2016-04-30 NOTE — Progress Notes (Signed)
[facility], patients pharmacy, faxed a refill request for patients medications. This company requires a 90 day order, patient is due and refill is appropriate. The order was sent to the patient pharmacy

## 2016-05-14 DIAGNOSIS — G894 Chronic pain syndrome: Secondary | ICD-10-CM | POA: Diagnosis not present

## 2016-05-14 DIAGNOSIS — M47817 Spondylosis without myelopathy or radiculopathy, lumbosacral region: Secondary | ICD-10-CM | POA: Diagnosis not present

## 2016-05-14 DIAGNOSIS — M62830 Muscle spasm of back: Secondary | ICD-10-CM | POA: Diagnosis not present

## 2016-05-14 DIAGNOSIS — M47812 Spondylosis without myelopathy or radiculopathy, cervical region: Secondary | ICD-10-CM | POA: Diagnosis not present

## 2016-05-16 ENCOUNTER — Emergency Department (HOSPITAL_COMMUNITY): Payer: PPO

## 2016-05-16 ENCOUNTER — Encounter (HOSPITAL_COMMUNITY): Payer: Self-pay | Admitting: Emergency Medicine

## 2016-05-16 ENCOUNTER — Emergency Department (HOSPITAL_COMMUNITY)
Admission: EM | Admit: 2016-05-16 | Discharge: 2016-05-17 | Disposition: A | Discharge status: Home / Self Care | Payer: PPO | Attending: Emergency Medicine | Admitting: Emergency Medicine

## 2016-05-16 DIAGNOSIS — S60221A Contusion of right hand, initial encounter: Secondary | ICD-10-CM | POA: Insufficient documentation

## 2016-05-16 DIAGNOSIS — W19XXXA Unspecified fall, initial encounter: Secondary | ICD-10-CM

## 2016-05-16 DIAGNOSIS — Z23 Encounter for immunization: Secondary | ICD-10-CM | POA: Diagnosis not present

## 2016-05-16 DIAGNOSIS — S80212A Abrasion, left knee, initial encounter: Secondary | ICD-10-CM | POA: Diagnosis not present

## 2016-05-16 DIAGNOSIS — S01112A Laceration without foreign body of left eyelid and periocular area, initial encounter: Secondary | ICD-10-CM | POA: Diagnosis not present

## 2016-05-16 DIAGNOSIS — W01198A Fall on same level from slipping, tripping and stumbling with subsequent striking against other object, initial encounter: Secondary | ICD-10-CM | POA: Insufficient documentation

## 2016-05-16 DIAGNOSIS — S6991XA Unspecified injury of right wrist, hand and finger(s), initial encounter: Secondary | ICD-10-CM | POA: Diagnosis not present

## 2016-05-16 DIAGNOSIS — Y9301 Activity, walking, marching and hiking: Secondary | ICD-10-CM | POA: Diagnosis not present

## 2016-05-16 DIAGNOSIS — S60229A Contusion of unspecified hand, initial encounter: Secondary | ICD-10-CM

## 2016-05-16 DIAGNOSIS — Y929 Unspecified place or not applicable: Secondary | ICD-10-CM | POA: Diagnosis not present

## 2016-05-16 DIAGNOSIS — S0181XA Laceration without foreign body of other part of head, initial encounter: Secondary | ICD-10-CM

## 2016-05-16 DIAGNOSIS — S0993XA Unspecified injury of face, initial encounter: Secondary | ICD-10-CM | POA: Diagnosis not present

## 2016-05-16 DIAGNOSIS — M79644 Pain in right finger(s): Secondary | ICD-10-CM | POA: Diagnosis not present

## 2016-05-16 DIAGNOSIS — Y999 Unspecified external cause status: Secondary | ICD-10-CM | POA: Diagnosis not present

## 2016-05-16 DIAGNOSIS — Z87891 Personal history of nicotine dependence: Secondary | ICD-10-CM | POA: Diagnosis not present

## 2016-05-16 DIAGNOSIS — Y92009 Unspecified place in unspecified non-institutional (private) residence as the place of occurrence of the external cause: Secondary | ICD-10-CM

## 2016-05-16 MED ORDER — LIDOCAINE HCL (PF) 1 % IJ SOLN
5.0000 mL | Freq: Once | INTRAMUSCULAR | Status: AC
  Administered 2016-05-16: 5 mL
  Filled 2016-05-16: qty 5

## 2016-05-16 MED ORDER — TETANUS-DIPHTH-ACELL PERTUSSIS 5-2.5-18.5 LF-MCG/0.5 IM SUSP
0.5000 mL | Freq: Once | INTRAMUSCULAR | Status: AC
  Administered 2016-05-16: 0.5 mL via INTRAMUSCULAR
  Filled 2016-05-16: qty 0.5

## 2016-05-16 NOTE — ED Triage Notes (Signed)
Per MD Plunkett, CT not needed at this time.

## 2016-05-16 NOTE — ED Provider Notes (Signed)
[facility] DEPT Provider Note   CSN: [id number] Arrival date & time: 05/16/16  2103  By signing my name below, I, Reola Mosher, attest that this documentation has been prepared under the direction and in the presence of Etta Quill, NP. Electronically Signed: Reola Mosher, ED Scribe. 05/16/16. 10:06 PM.  History   Chief Complaint Chief Complaint  Patient presents with  . Fall  . Head Laceration   Patient gave verbal permission to utilize photo for medical documentation only. The image was not stored on any personal device  The history is provided by the patient. No language interpreter was used.  Laceration   The incident occurred less than 1 hour ago. The laceration is located on the face. The laceration is 3 cm in size. Injury mechanism: concrete upon fall. The pain is at a severity of 8/10. The pain is moderate. The pain has been improving since onset. She reports no foreign bodies present. Her tetanus status is out of date.   HPI Comments: Julie Neal is a 60 y.o. female who presents to the Emergency Department complaining of laceration sustained just above the left eyebrow s/p mechanical, ground-level fall that occurred just PTA. Bleeding is controlled with pressure. Pt reports that she was walking when she tripped and fell forward onto concrete with her arms outstretched, striking her frontal face on the ground which sustained her laceration. Pt additionally notes left knee pain and right hand pain sustained from the mechanism of the incident. No LOC otherwise. Pt has been applying ice to the areas of pain with minimal relief, but no other treatments were tried prior to coming into the ED. Her pain to her knee is exacerbated with palpation of the area and movement. No hx of DM. She denies neck pain, or any other associated symptoms. Tetanus is not UTD.  Past Medical History:  Diagnosis Date  . Anxiety   . Asymptomatic stenosis of right carotid artery    1-30%   . Cervicalgia   . Depression   . Fibromyalgia   . Fracture of ankle, bimalleolar, left, closed 08/10/2014  . Hyperlipidemia   . Lumbago   . Memory loss   . Migraines   . Neuralgic migraines   . Osteoporosis    Patient Active Problem List   Diagnosis Date Noted  . Asymptomatic stenosis of right carotid artery   . Fracture of ankle, bimalleolar, left, closed 08/10/2014  . Shaking 01/12/2013  . Anxiety   . Memory loss   . Lumbago   . Fibromyalgia   . Migraines   . Hyperlipidemia   . Depression   . Neuralgic migraines   . PULMONARY NODULE 07/25/2010  . RECTAL BLEEDING 04/22/2010  . NAUSEA AND VOMITING 04/22/2010  . CHANGE IN BOWELS 04/22/2010  . RLQ PAIN 04/22/2010  . MIGRAINES, HX OF 01/05/2007   Past Surgical History:  Procedure Laterality Date  . ABDOMINAL HYSTERECTOMY    . BACK SURGERY    . HAND SURGERY Bilateral   . LEG SURGERY Bilateral   . NECK SURGERY    . SHOULDER SURGERY Left    OB History    No data available     Home Medications    Prior to Admission medications   Medication Sig Start Date End Date Taking? Authorizing Provider  alendronate (FOSAMAX) 70 MG tablet Take 1 tablet (70 mg total) by mouth every 7 (seven) days. Take with a full glass of water on an empty stomach. 09/12/15   Susy Frizzle,  MD  atorvastatin (LIPITOR) 20 MG tablet Take 1 tablet (20 mg total) by mouth daily. 08/02/15   Susy Frizzle, MD  buPROPion (WELLBUTRIN XL) 300 MG 24 hr tablet Take 1 tablet (300 mg total) by mouth daily. 04/30/16   Kathlee Nations, MD  busPIRone (BUSPAR) 10 MG tablet Take 1 tablet (10 mg total) by mouth 4 (four) times daily. 04/30/16   Kathlee Nations, MD  diazepam (VALIUM) 5 MG tablet Take 1 tablet (5 mg total) by mouth 2 (two) times daily as needed. 03/27/16   Susy Frizzle, MD  morphine (MSIR) 15 MG tablet Take 15 mg by mouth 4 (four) times daily.    Historical Provider, MD  tiZANidine (ZANAFLEX) 4 MG tablet Take 4 mg by mouth every 6 (six) hours as  needed.     Historical Provider, MD  traZODone (DESYREL) 100 MG tablet Take 2 tablets (200 mg total) by mouth at bedtime. 04/30/16   Kathlee Nations, MD   Family History Family History  Problem Relation Age of Onset  . Heart attack Father   . Heart disease Father 48  . Alzheimer's disease Mother   . Stroke Mother   . Diabetes type II Brother   . Breast cancer Sister    Social History Social History  Substance Use Topics  . Smoking status: Former Smoker    Types: Cigarettes  . Smokeless tobacco: Never Used     Comment: Quit in 1988  . Alcohol use No   Allergies   Mobic [meloxicam]; Penicillins; and Nubain [nalbuphine hcl]  Review of Systems Review of Systems  Musculoskeletal: Positive for arthralgias (left knee) and myalgias. Negative for neck pain.  Skin: Positive for wound.  Neurological: Negative for syncope.  All other systems reviewed and are negative.  Physical Exam Updated Vital Signs BP 111/79 (BP Location: Right Arm)   Pulse 80   Temp 98.1 F (36.7 C) (Oral)   Resp 18   SpO2 96%   Physical Exam  Constitutional: She is oriented to person, place, and time. She appears well-developed and well-nourished.  HENT:  Head: Normocephalic.  Right Ear: External ear normal.  Left Ear: External ear normal.  Nose: Nose normal.  3cm laceration to the outer aspect of the left eyebrow. See image.  Eyes: Conjunctivae and EOM are normal. Pupils are equal, round, and reactive to light.  Cardiovascular: Normal rate, regular rhythm, normal heart sounds and intact distal pulses.   No murmur heard. Pulmonary/Chest: Effort normal and breath sounds normal. No respiratory distress. She has no wheezes. She has no rales.  Abdominal: Soft. She exhibits no distension. There is no tenderness.  Musculoskeletal: Normal range of motion.  Superficial abrasion noted to the outer aspect of the left knee that extends into the mid thigh. See image.   Neurological: She is alert and oriented to  person, place, and time. No cranial nerve deficit. Coordination normal.  Skin: Skin is warm and dry.  Psychiatric: She has a normal mood and affect. Her behavior is normal.  Nursing note and vitals reviewed.       ED Treatments / Results  DIAGNOSTIC STUDIES: Oxygen Saturation is 96% on RA, normal by my interpretation.   COORDINATION OF CARE: 10:00 PM-Discussed next steps with pt. Pt verbalized understanding and is agreeable with the plan.   Radiology Dg Hand Complete Right  Result Date: 05/16/2016 CLINICAL DATA:  Status post fall onto concrete. Fall on outstretched hand. Pain at lateral aspect of fourth and fifth  fingers. EXAM: RIGHT HAND - COMPLETE 3+ VIEW COMPARISON:  None. FINDINGS: The bones are osteopenic. Joint spaces are maintained. There is no fracture or dislocation. Soft tissues are unremarkable. No radiopaque foreign body. IMPRESSION: No fracture, dislocation or advanced arthropathy of the right hand. Electronically Signed   By: Ulyses Jarred M.D.   On: 05/16/2016 22:05   Procedures Procedures   LACERATION REPAIR PROCEDURE NOTE The patient's identification was confirmed and consent was obtained. This procedure was performed by Etta Quill, NP at 11:22 PM. Site: outer aspect of the left eyebrow Sterile procedures observed Anesthetic used (type and amt): Lidocaine 1% w/o epi (1cc) Suture type/size: 5-0 Proline Length: 3cm # of Sutures: 5 Technique: simple interrupted  Complexity: simple Antibx ointment applied Tetanus ordered and is now UTD Site anesthetized, irrigated with NS, explored without evidence of foreign body, wound well approximated, site covered with dry, sterile dressing. Patient tolerated procedure well without complications. Instructions for care discussed verbally and patient provided with additional written instructions for homecare and f/u.  Medications Ordered in ED Medications  lidocaine (PF) (XYLOCAINE) 1 % injection 5 mL (not administered)    Tdap (BOOSTRIX) injection 0.5 mL (not administered)   Initial Impression / Assessment and Plan / ED Course  I have reviewed the triage vital signs and the nursing notes.  Pertinent labs & imaging results that were available during my care of the patient were reviewed by me and considered in my medical decision making (see chart for details).  Clinical Course   Pt is a 60yo female who presents into the ED s/p mechanical, ground-level onto concrete. Due to MOI, DG right hand was obtained. Case was discussed with attending provider Dr Maryan Rued, who agrees that a CT Head was not needed at this time. DG right hand was obtained that was unremarkable for any bony abnormalities. Tdap booster given and is now UTD. Pressure irrigation performed. Bottom of the wound visualized with bleeding controled. Laceration occurred < 8 hours prior to repair which was well tolerated. Pt has no co morbidities to effect normal wound healing. Discussed suture home care w pt and answered questions. Pt to f/u for wound check and suture removal in ~7 days. Pt is hemodynamically stable w no complaints prior to d/c. All questions answered prior to disposition.  Final Clinical Impressions(s) / ED Diagnoses   Final diagnoses:  Facial laceration, initial encounter  Fall in home, initial encounter  Abrasion, left knee, initial encounter  Contusion of hand, initial encounter   New Prescriptions Discharge Medication List as of 05/17/2016 12:13 AM     I personally performed the services described in this documentation, which was scribed in my presence. The recorded information has been reviewed and is accurate.     Etta Quill, NP 05/17/16 [id number]    Blanchie Dessert, MD 05/17/16 2138

## 2016-05-16 NOTE — ED Triage Notes (Signed)
Patient arrives with complaint of head injury secondary to falling onto concrete. States her dogs leash became tangled around her legs and she fell. Laceration to left brow approximately 4cm. Bleeding currently minimal. Also complaining of right hand pain and left knee pain. Left knee with obvious scrapes. Right hand appears mildly swollen over lateral aspect.

## 2016-05-17 NOTE — Discharge Instructions (Signed)
Suture removal in 7 days

## 2016-05-23 ENCOUNTER — Encounter: Payer: Self-pay | Admitting: Family Medicine

## 2016-05-23 ENCOUNTER — Ambulatory Visit (INDEPENDENT_AMBULATORY_CARE_PROVIDER_SITE_OTHER): Payer: PPO | Admitting: Family Medicine

## 2016-05-23 VITALS — BP 110/60 | HR 84 | Temp 98.8°F | Resp 18 | Wt 131.0 lb

## 2016-05-23 DIAGNOSIS — Z4802 Encounter for removal of sutures: Secondary | ICD-10-CM

## 2016-05-23 DIAGNOSIS — Z23 Encounter for immunization: Secondary | ICD-10-CM | POA: Diagnosis not present

## 2016-05-23 NOTE — Addendum Note (Signed)
Addended by: Shary Decamp B on: 05/23/2016 05:01 PM   Modules accepted: Orders

## 2016-05-23 NOTE — Progress Notes (Signed)
Subjective:    Patient ID: Julie Neal, female    DOB: 02/12/1956, 60 y.o.   MRN: [id number]  HPI 1 week ago, the patient tripped over her dog striking her left orbit on concrete as she fell as well as her left shoulder. Today she has a yellow large ecchymosis around her left eye as well as a large yellow and brown ecchymosis on her left shoulder and her left knee. She sustained a laceration and fall. She had go the emergency room where the laceration was closed with 5, simple interrupted 4-0 Ethilon suture. She is here today for suture removal. Past Medical History:  Diagnosis Date  . Anxiety   . Asymptomatic stenosis of right carotid artery    1-30%  . Cervicalgia   . Depression   . Fibromyalgia   . Fracture of ankle, bimalleolar, left, closed 08/10/2014  . Hyperlipidemia   . Lumbago   . Memory loss   . Migraines   . Neuralgic migraines   . Osteoporosis    Past Surgical History:  Procedure Laterality Date  . ABDOMINAL HYSTERECTOMY    . BACK SURGERY    . HAND SURGERY Bilateral   . LEG SURGERY Bilateral   . NECK SURGERY    . SHOULDER SURGERY Left    Current Outpatient Prescriptions on File Prior to Visit  Medication Sig Dispense Refill  . alendronate (FOSAMAX) 70 MG tablet Take 1 tablet (70 mg total) by mouth every 7 (seven) days. Take with a full glass of water on an empty stomach. 12 tablet 8  . atorvastatin (LIPITOR) 20 MG tablet Take 1 tablet (20 mg total) by mouth daily. 90 tablet 3  . buPROPion (WELLBUTRIN XL) 300 MG 24 hr tablet Take 1 tablet (300 mg total) by mouth daily. 90 tablet 0  . busPIRone (BUSPAR) 10 MG tablet Take 1 tablet (10 mg total) by mouth 4 (four) times daily. 360 tablet 0  . diazepam (VALIUM) 5 MG tablet Take 1 tablet (5 mg total) by mouth 2 (two) times daily as needed. 60 tablet 3  . morphine (MSIR) 15 MG tablet Take 15 mg by mouth 4 (four) times daily.    Marland Kitchen tiZANidine (ZANAFLEX) 4 MG tablet Take 4 mg by mouth every 6 (six) hours as needed.     .  traZODone (DESYREL) 100 MG tablet Take 2 tablets (200 mg total) by mouth at bedtime. 180 tablet 0   No current facility-administered medications on file prior to visit.    Allergies  Allergen Reactions  . Mobic [Meloxicam] Anaphylaxis  . Penicillins Anaphylaxis  . Nubain [Nalbuphine Hcl] Diarrhea and Nausea And Vomiting   Social History   Social History  . Marital status: Married    Spouse name: [name]  . Number of children: 1  . Years of education: college   Occupational History  .      disabled   Social History Main Topics  . Smoking status: Former Smoker    Types: Cigarettes  . Smokeless tobacco: Never Used     Comment: Quit in 1988  . Alcohol use No  . Drug use: No  . Sexual activity: Not on file   Other Topics Concern  . Not on file   Social History Narrative   Patient lives at home with her husband [name])  and has a college education. Patient has one child.   Patient is disabled. Right handed.   Caffeine one cup of coffee.      Review of  Systems  All other systems reviewed and are negative.      Objective:   Physical Exam  Cardiovascular: Normal rate, regular rhythm and normal heart sounds.   Pulmonary/Chest: Effort normal and breath sounds normal.  Skin: Bruising, ecchymosis and laceration noted.  Vitals reviewed.         Assessment & Plan:  Visit for suture removal  5 sutures removed without difficulty. Wound was reinforced with 3 Steri-Strips. Wound care was discussed. Patient received her flu shot.

## 2016-06-05 ENCOUNTER — Ambulatory Visit (HOSPITAL_COMMUNITY): Payer: Self-pay | Admitting: Psychiatry

## 2016-06-11 DIAGNOSIS — K5900 Constipation, unspecified: Secondary | ICD-10-CM | POA: Diagnosis not present

## 2016-06-11 DIAGNOSIS — G894 Chronic pain syndrome: Secondary | ICD-10-CM | POA: Diagnosis not present

## 2016-06-11 DIAGNOSIS — M47817 Spondylosis without myelopathy or radiculopathy, lumbosacral region: Secondary | ICD-10-CM | POA: Diagnosis not present

## 2016-06-11 DIAGNOSIS — M1711 Unilateral primary osteoarthritis, right knee: Secondary | ICD-10-CM | POA: Diagnosis not present

## 2016-06-11 DIAGNOSIS — M791 Myalgia: Secondary | ICD-10-CM | POA: Diagnosis not present

## 2016-06-11 DIAGNOSIS — Z79891 Long term (current) use of opiate analgesic: Secondary | ICD-10-CM | POA: Diagnosis not present

## 2016-06-11 DIAGNOSIS — M62830 Muscle spasm of back: Secondary | ICD-10-CM | POA: Diagnosis not present

## 2016-06-11 DIAGNOSIS — M47812 Spondylosis without myelopathy or radiculopathy, cervical region: Secondary | ICD-10-CM | POA: Diagnosis not present

## 2016-06-12 ENCOUNTER — Other Ambulatory Visit: Payer: Self-pay | Admitting: Family Medicine

## 2016-06-30 ENCOUNTER — Encounter: Payer: Self-pay | Admitting: Family Medicine

## 2016-06-30 ENCOUNTER — Other Ambulatory Visit: Payer: Self-pay | Admitting: Family Medicine

## 2016-06-30 ENCOUNTER — Ambulatory Visit (INDEPENDENT_AMBULATORY_CARE_PROVIDER_SITE_OTHER): Payer: PPO | Admitting: Family Medicine

## 2016-06-30 VITALS — BP 92/56 | HR 64 | Temp 99.0°F | Resp 18 | Ht 66.0 in | Wt 130.0 lb

## 2016-06-30 DIAGNOSIS — Z79899 Other long term (current) drug therapy: Secondary | ICD-10-CM | POA: Diagnosis not present

## 2016-06-30 DIAGNOSIS — Z23 Encounter for immunization: Secondary | ICD-10-CM

## 2016-06-30 DIAGNOSIS — E7800 Pure hypercholesterolemia, unspecified: Secondary | ICD-10-CM

## 2016-06-30 DIAGNOSIS — F324 Major depressive disorder, single episode, in partial remission: Secondary | ICD-10-CM

## 2016-06-30 LAB — LIPID PANEL
CHOL/HDL RATIO: 3 ratio (ref <5.0)
CHOLESTEROL: 113 mg/dL (ref <200)
HDL: 38 mg/dL — ABNORMAL LOW (ref >50)
LDL Cholesterol: 63 mg/dL (ref <100)
TRIGLYCERIDES: 61 mg/dL (ref <150)
VLDL: 12 mg/dL (ref <30)

## 2016-06-30 LAB — COMPLETE METABOLIC PANEL WITH GFR
ALBUMIN: 4 g/dL (ref 3.6–5.1)
ALK PHOS: 48 U/L (ref 33–130)
ALT: 7 U/L (ref 6–29)
AST: 17 U/L (ref 10–35)
BUN: 17 mg/dL (ref 7–25)
CALCIUM: 9.3 mg/dL (ref 8.6–10.4)
CHLORIDE: 101 mmol/L (ref 98–110)
CO2: 31 mmol/L (ref 20–31)
CREATININE: 0.98 mg/dL (ref 0.50–0.99)
GFR, EST AFRICAN AMERICAN: 72 mL/min (ref >=60)
GFR, Est Non African American: 62 mL/min (ref >=60)
Glucose, Bld: 120 mg/dL — ABNORMAL HIGH (ref 70–99)
POTASSIUM: 4.2 mmol/L (ref 3.5–5.3)
Sodium: 138 mmol/L (ref 135–146)
Total Bilirubin: 0.5 mg/dL (ref 0.2–1.2)
Total Protein: 6.8 g/dL (ref 6.1–8.1)

## 2016-06-30 NOTE — Progress Notes (Signed)
Subjective:    Patient ID: Julie Neal, female    DOB: 1956-06-22, 60 y.o.   MRN: [id number]  HPI Patient is here today asking me to manage her depression. She has a history of major depressive disorder. She is currently on a combination of Wellbutrin sustained release 300 mg by mouth every morning, trazodone 200 mg by mouth daily at bedtime for insomnia and BuSpar 10 mg 4 times a day for anxiety and depression. Does that this combination seems to be working well for her. She has been on the 4 times a day BuSpar for 6 months. Recently I saw the patient after she fell and sustained a laceration. She also recently had a bone density in January revealing severe osteoporosis in her right hip with a T score of -6.4. I had a long discussion today with the patient regarding polypharmacy, her increased risk of falls, and her severe osteoporosis, and the chance that she could sustain a hip fracture. She is on morphine 15 mg 4 times a day. 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 She is on Zanaflex 4 times a day for muscle spasms in her back. She also has a history of hyperlipidemia and she is due for fasting lipid panel Past Medical History:  Diagnosis Date  . Anxiety   . Asymptomatic stenosis of right carotid artery    1-30%  . Cervicalgia   . Depression   . Fibromyalgia   . Fracture of ankle, bimalleolar, left, closed 08/10/2014  . Hyperlipidemia   . Lumbago   . Memory loss   . Migraines   . Neuralgic migraines   . Osteoporosis    Past Surgical History:  Procedure Laterality Date  . ABDOMINAL HYSTERECTOMY    . BACK SURGERY    . HAND SURGERY Bilateral   . LEG SURGERY Bilateral   . NECK SURGERY    . SHOULDER SURGERY Left    Current Outpatient Prescriptions on File Prior to Visit  Medication Sig Dispense Refill  . alendronate (FOSAMAX) 70 MG tablet Take 1 tablet (70 mg total) by mouth every 7 (seven) days. Take  with a full glass of water on an empty stomach. 12 tablet 8  . atorvastatin (LIPITOR) 20 MG tablet Take 1 tablet by mouth daily. 90 tablet 1  . buPROPion (WELLBUTRIN XL) 300 MG 24 hr tablet Take 1 tablet (300 mg total) by mouth daily. 90 tablet 0  . busPIRone (BUSPAR) 10 MG tablet Take 1 tablet (10 mg total) by mouth 4 (four) times daily. 360 tablet 0  . diazepam (VALIUM) 5 MG tablet Take 1 tablet (5 mg total) by mouth 2 (two) times daily as needed. 60 tablet 3  . morphine (MSIR) 15 MG tablet Take 15 mg by mouth 4 (four) times daily.    Marland Kitchen tiZANidine (ZANAFLEX) 4 MG tablet Take 4 mg by mouth every 6 (six) hours as needed.     . traZODone (DESYREL) 100 MG tablet Take 2 tablets (200 mg total) by mouth at bedtime. 180 tablet 0   No current facility-administered medications on file prior to visit.    Allergies  Allergen Reactions  . Mobic [Meloxicam] Anaphylaxis  . Penicillins Anaphylaxis  . Nubain [Nalbuphine Hcl] Diarrhea and Nausea And Vomiting   Social History   Social History  . Marital status: Married    Spouse name: [name]  . Number of children: 1  . Years  of education: college   Occupational History  .      disabled   Social History Main Topics  . Smoking status: Former Smoker    Types: Cigarettes  . Smokeless tobacco: Never Used     Comment: Quit in 1988  . Alcohol use No  . Drug use: No  . Sexual activity: Not on file   Other Topics Concern  . Not on file   Social History Narrative   Patient lives at home with her husband [name])  and has a college education. Patient has one child.   Patient is disabled. Right handed.   Caffeine one cup of coffee.      Review of Systems  All other systems reviewed and are negative.      Objective:   Physical Exam  Constitutional: She is oriented to person, place, and time.  Cardiovascular: Normal rate, regular rhythm and normal heart sounds.   Pulmonary/Chest: Effort normal and breath sounds normal. No respiratory  distress. She has no wheezes. She has no rales.  Abdominal: Soft. Bowel sounds are normal.  Neurological: She is alert and oriented to person, place, and time.  Psychiatric: She has a normal mood and affect. Her behavior is normal. Judgment and thought content normal.  Vitals reviewed.         Assessment & Plan:  Pure hypercholesterolemia - Plan: COMPLETE METABOLIC PANEL WITH GFR, Lipid panel  Need for Tdap vaccination  Polypharmacy  Major depressive disorder with single episode, in partial remission (HCC)  Spent 20 minutes discussing the patient's medications with her and the risk of falls due to polypharmacy. Given her severe osteoporosis, I am concerned if she fails she will likely sustained a hip fracture even know she is on Fosamax. Therefore I recommended that we try to wean off some of her medications or at least try switching the medications without the risk of sedation. Therefore I recommended decreasing BuSpar 5 mg 4 times a day and recheck in one month to see how she's doing. I would like to try to wean off this medication gradually. If she is tolerating this, I would suggest trying Cymbalta both to help with depression and anxiety but also to possibly help manage her fibromyalgia and chronic pain. With this perhaps we can discontinue either to tizanidine or Valium. The patient is willing to try

## 2016-07-07 LAB — HEMOGLOBIN A1C

## 2016-07-10 ENCOUNTER — Other Ambulatory Visit: Payer: Self-pay | Admitting: Family Medicine

## 2016-07-10 DIAGNOSIS — R739 Hyperglycemia, unspecified: Secondary | ICD-10-CM

## 2016-07-14 DIAGNOSIS — M47812 Spondylosis without myelopathy or radiculopathy, cervical region: Secondary | ICD-10-CM | POA: Diagnosis not present

## 2016-07-14 DIAGNOSIS — M47817 Spondylosis without myelopathy or radiculopathy, lumbosacral region: Secondary | ICD-10-CM | POA: Diagnosis not present

## 2016-07-14 DIAGNOSIS — M62830 Muscle spasm of back: Secondary | ICD-10-CM | POA: Diagnosis not present

## 2016-07-14 DIAGNOSIS — G894 Chronic pain syndrome: Secondary | ICD-10-CM | POA: Diagnosis not present

## 2016-07-16 ENCOUNTER — Telehealth: Payer: Self-pay | Admitting: Family Medicine

## 2016-07-16 DIAGNOSIS — F330 Major depressive disorder, recurrent, mild: Secondary | ICD-10-CM

## 2016-07-16 NOTE — Telephone Encounter (Signed)
Requesting a refill on her Valium - Ok to refill??      (needs to be 3 month supply and goes [facility])

## 2016-07-17 NOTE — Telephone Encounter (Signed)
ok 

## 2016-07-18 MED ORDER — TRAZODONE HCL 100 MG PO TABS: 200.0000 mg | ORAL_TABLET | Freq: Every day | ORAL | 1 refills | Status: DC

## 2016-07-18 MED ORDER — BUPROPION HCL ER (XL) 300 MG PO TB24: 300.0000 mg | ORAL_TABLET | Freq: Every day | ORAL | 3 refills | Status: DC

## 2016-07-18 MED ORDER — DIAZEPAM 5 MG PO TABS: 5.0000 mg | ORAL_TABLET | Freq: Two times a day (BID) | ORAL | 3 refills | Status: DC | PRN

## 2016-07-18 MED ORDER — DIAZEPAM 5 MG PO TABS: 5.0000 mg | ORAL_TABLET | Freq: Two times a day (BID) | ORAL | 0 refills | Status: DC | PRN

## 2016-07-18 NOTE — Telephone Encounter (Addendum)
Pt called and lmovm that she wanted valium, trazodone and wellbutrin to go to [facility]. [facility]s [facility] and [facility] to stop the valium rx with them and meds sent and called to [facility]

## 2016-07-18 NOTE — Addendum Note (Signed)
Addended by: Shary Decamp B on: 07/18/2016 02:59 PM   Modules accepted: Orders

## 2016-07-18 NOTE — Telephone Encounter (Signed)
rx printed, signed and faxed to [facility]

## 2016-08-13 DIAGNOSIS — M47817 Spondylosis without myelopathy or radiculopathy, lumbosacral region: Secondary | ICD-10-CM | POA: Diagnosis not present

## 2016-08-13 DIAGNOSIS — M62830 Muscle spasm of back: Secondary | ICD-10-CM | POA: Diagnosis not present

## 2016-08-13 DIAGNOSIS — M47812 Spondylosis without myelopathy or radiculopathy, cervical region: Secondary | ICD-10-CM | POA: Diagnosis not present

## 2016-08-13 DIAGNOSIS — G894 Chronic pain syndrome: Secondary | ICD-10-CM | POA: Diagnosis not present

## 2016-09-03 ENCOUNTER — Other Ambulatory Visit: Payer: Self-pay | Admitting: Family Medicine

## 2016-09-03 MED ORDER — ATORVASTATIN CALCIUM 20 MG PO TABS: 20.0000 mg | ORAL_TABLET | Freq: Every day | ORAL | 1 refills | Status: DC

## 2016-09-03 NOTE — Telephone Encounter (Signed)
Medication refilled per protocol. 

## 2016-09-15 DIAGNOSIS — G894 Chronic pain syndrome: Secondary | ICD-10-CM | POA: Diagnosis not present

## 2016-09-15 DIAGNOSIS — M47812 Spondylosis without myelopathy or radiculopathy, cervical region: Secondary | ICD-10-CM | POA: Diagnosis not present

## 2016-09-15 DIAGNOSIS — M47817 Spondylosis without myelopathy or radiculopathy, lumbosacral region: Secondary | ICD-10-CM | POA: Diagnosis not present

## 2016-09-15 DIAGNOSIS — M62830 Muscle spasm of back: Secondary | ICD-10-CM | POA: Diagnosis not present

## 2016-10-09 DIAGNOSIS — B9681 Helicobacter pylori [H. pylori] as the cause of diseases classified elsewhere: Secondary | ICD-10-CM

## 2016-10-09 DIAGNOSIS — K2970 Gastritis, unspecified, without bleeding: Secondary | ICD-10-CM

## 2016-10-09 HISTORY — DX: Helicobacter pylori (H. pylori) as the cause of diseases classified elsewhere: B96.81

## 2016-10-09 HISTORY — DX: Gastritis, unspecified, without bleeding: K29.70

## 2016-10-16 DIAGNOSIS — M62830 Muscle spasm of back: Secondary | ICD-10-CM | POA: Diagnosis not present

## 2016-10-16 DIAGNOSIS — M7060 Trochanteric bursitis, unspecified hip: Secondary | ICD-10-CM | POA: Diagnosis not present

## 2016-10-16 DIAGNOSIS — M47812 Spondylosis without myelopathy or radiculopathy, cervical region: Secondary | ICD-10-CM | POA: Diagnosis not present

## 2016-10-16 DIAGNOSIS — M47817 Spondylosis without myelopathy or radiculopathy, lumbosacral region: Secondary | ICD-10-CM | POA: Diagnosis not present

## 2016-10-16 DIAGNOSIS — G894 Chronic pain syndrome: Secondary | ICD-10-CM | POA: Diagnosis not present

## 2016-10-20 ENCOUNTER — Ambulatory Visit (INDEPENDENT_AMBULATORY_CARE_PROVIDER_SITE_OTHER): Payer: PPO | Admitting: Family Medicine

## 2016-10-20 ENCOUNTER — Encounter: Payer: Self-pay | Admitting: Family Medicine

## 2016-10-20 VITALS — BP 118/72 | HR 76 | Temp 98.1°F | Resp 16 | Ht 66.0 in | Wt 118.0 lb

## 2016-10-20 DIAGNOSIS — R6881 Early satiety: Secondary | ICD-10-CM | POA: Diagnosis not present

## 2016-10-20 DIAGNOSIS — R634 Abnormal weight loss: Secondary | ICD-10-CM

## 2016-10-20 LAB — CBC WITH DIFFERENTIAL/PLATELET
BASOS ABS: 0 {cells}/uL (ref 0–200)
BASOS PCT: 0 %
EOS PCT: 1 %
Eosinophils Absolute: 64 cells/uL (ref 15–500)
HCT: 42.5 % (ref 35.0–45.0)
HEMOGLOBIN: 13.6 g/dL (ref 12.0–15.0)
LYMPHS ABS: 1536 {cells}/uL (ref 850–3900)
Lymphocytes Relative: 24 %
MCH: 28.9 pg (ref 27.0–33.0)
MCHC: 32 g/dL (ref 32.0–36.0)
MCV: 90.2 fL (ref 80.0–100.0)
MONOS PCT: 8 %
MPV: 11.3 fL (ref 7.5–12.5)
Monocytes Absolute: 512 cells/uL (ref 200–950)
NEUTROS ABS: 4288 {cells}/uL (ref 1500–7800)
Neutrophils Relative %: 67 %
PLATELETS: 177 10*3/uL (ref 140–400)
RBC: 4.71 MIL/uL (ref 3.80–5.10)
RDW: 13.6 % (ref 11.0–15.0)
WBC: 6.4 10*3/uL (ref 3.8–10.8)

## 2016-10-20 NOTE — Progress Notes (Signed)
Subjective:    Patient ID: Julie Neal, female    DOB: Jun 24, 1956, 61 y.o.   MRN: [id number]  HPI  06/2016 Patient is here today asking me to manage her depression. She has a history of major depressive disorder. She is currently on a combination of Wellbutrin sustained release 300 mg by mouth every morning, trazodone 200 mg by mouth daily at bedtime for insomnia and BuSpar 10 mg 4 times a day for anxiety and depression. Does that this combination seems to be working well for her. She has been on the 4 times a day BuSpar for 6 months. Recently I saw the patient after she fell and sustained a laceration. She also recently had a bone density in January revealing severe osteoporosis in her right hip with a T score of -6.4. I had a long discussion today with the patient regarding polypharmacy, her increased risk of falls, and her severe osteoporosis, and the chance that she could sustain a hip fracture. She is on morphine 15 mg 4 times a day. 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 She is on Zanaflex 4 times a day for muscle spasms in her back. She also has a history of hyperlipidemia and she is due for fasting lipid panel.  AT that time, my plan was: Spent 20 minutes discussing the patient's medications with her and the risk of falls due to polypharmacy. Given her severe osteoporosis, I am concerned if she fails she will likely sustained a hip fracture even know she is on Fosamax. Therefore I recommended that we try to wean off some of her medications or at least try switching the medications without the risk of sedation. Therefore I recommended decreasing BuSpar 5 mg 4 times a day and recheck in one month to see how she's doing. I would like to try to wean off this medication gradually. If she is tolerating this, I would suggest trying Cymbalta both to help with depression and anxiety but also to possibly help manage her  fibromyalgia and chronic pain. With this perhaps we can discontinue either to tizanidine or Valium. The patient is willing to try  10/20/16 Over the last 6 months, the patient's lost approximately 30 pounds. She reports no appetite and early satiety. She has diffuse widespread pain that she has a history of fibromyalgia. She also reports headaches that she has a history of severe migraines. She denies any other symptoms. She denies any fevers, night sweats, easy bruising. She denies any melena or hematochezia. She denies any nausea vomiting or diarrhea. She denies any cough or hemoptysis or chest pain or pleurisy. She denies any polyuria, polydipsia. She denies any depression, anhedonia, symptoms of anorexia, or bulimia. She denies any palpitations or tachycardia. Past Medical History:  Diagnosis Date  . Anxiety   . Asymptomatic stenosis of right carotid artery    1-30%  . Cervicalgia   . Depression   . Fibromyalgia   . Fracture of ankle, bimalleolar, left, closed 08/10/2014  . Hyperlipidemia   . Lumbago   . Memory loss   . Migraines   . Neuralgic migraines   . Osteoporosis    Past Surgical History:  Procedure Laterality Date  . ABDOMINAL HYSTERECTOMY    . BACK SURGERY    . HAND SURGERY Bilateral   . LEG SURGERY Bilateral   . NECK SURGERY    . SHOULDER SURGERY Left    Current  Outpatient Prescriptions on File Prior to Visit  Medication Sig Dispense Refill  . alendronate (FOSAMAX) 70 MG tablet Take 1 tablet (70 mg total) by mouth every 7 (seven) days. Take with a full glass of water on an empty stomach. 12 tablet 8  . atorvastatin (LIPITOR) 20 MG tablet Take 1 tablet (20 mg total) by mouth daily. 90 tablet 1  . buPROPion (WELLBUTRIN XL) 300 MG 24 hr tablet Take 1 tablet (300 mg total) by mouth daily. 90 tablet 3  . diazepam (VALIUM) 5 MG tablet Take 1 tablet (5 mg total) by mouth 2 (two) times daily as needed. 180 tablet 0  . morphine (MSIR) 15 MG tablet Take 15 mg by mouth 4 (four)  times daily.    . traZODone (DESYREL) 100 MG tablet Take 2 tablets (200 mg total) by mouth at bedtime. 180 tablet 1   No current facility-administered medications on file prior to visit.    Allergies  Allergen Reactions  . Mobic [Meloxicam] Anaphylaxis  . Penicillins Anaphylaxis  . Nubain [Nalbuphine Hcl] Diarrhea and Nausea And Vomiting   Social History   Social History  . Marital status: Married    Spouse name: [name]  . Number of children: 1  . Years of education: college   Occupational History  .      disabled   Social History Main Topics  . Smoking status: Former Smoker    Types: Cigarettes  . Smokeless tobacco: Never Used     Comment: Quit in 1988  . Alcohol use No  . Drug use: No  . Sexual activity: Not on file   Other Topics Concern  . Not on file   Social History Narrative   Patient lives at home with her husband [name])  and has a college education. Patient has one child.   Patient is disabled. Right handed.   Caffeine one cup of coffee.      Review of Systems  All other systems reviewed and are negative.      Objective:   Physical Exam  Constitutional: She is oriented to person, place, and time.  Cardiovascular: Normal rate, regular rhythm and normal heart sounds.   Pulmonary/Chest: Effort normal and breath sounds normal. No respiratory distress. She has no wheezes. She has no rales.  Abdominal: Soft. Bowel sounds are normal.  Neurological: She is alert and oriented to person, place, and time.  Psychiatric: She has a normal mood and affect. Her behavior is normal. Judgment and thought content normal.  Vitals reviewed.         Assessment & Plan:  Weight loss - Plan: CBC with Differential/Platelet, COMPLETE METABOLIC PANEL WITH GFR, TSH, Sedimentation rate, HIV antibody (with reflex), Hepatitis C Ab Reflex HCV RNA, QUANT, Ambulatory referral to Gastroenterology  Early satiety  Differential diagnosis includes cancer, endocrinology problems  like diabetes or hyperthyroidism, psychological problems like depression, or infections like HIV. I will screen the patient for HIV and hepatitis C given her age. She denies any depression or psychologic problems. I will screen her for hyperthyroidism with a TSH and also check a CMP to check for kidney failure, liver failure, or signs of diabetes. I'll screen her for leukemia with a CBC or other bone marrow abnormalities. Consult GI for an EGD and a colonoscopy. Last colonoscopy was in 2011. Affect with results of lab work, we may need to proceed with scanning including a CT scan of the chest given her history of smoking to screen for lung cancer. Await the  results of lab work first

## 2016-10-21 LAB — COMPLETE METABOLIC PANEL WITH GFR
ALBUMIN: 4.7 g/dL (ref 3.6–5.1)
ALT: 20 U/L (ref 6–29)
AST: 23 U/L (ref 10–35)
Alkaline Phosphatase: 43 U/L (ref 33–130)
BILIRUBIN TOTAL: 0.6 mg/dL (ref 0.2–1.2)
BUN: 22 mg/dL (ref 7–25)
CO2: 29 mmol/L (ref 20–31)
Calcium: 10.2 mg/dL (ref 8.6–10.4)
Chloride: 99 mmol/L (ref 98–110)
Creat: 1 mg/dL — ABNORMAL HIGH (ref 0.50–0.99)
GFR, EST NON AFRICAN AMERICAN: 61 mL/min (ref >=60)
GFR, Est African American: 70 mL/min (ref >=60)
GLUCOSE: 85 mg/dL (ref 70–99)
POTASSIUM: 4.8 mmol/L (ref 3.5–5.3)
SODIUM: 139 mmol/L (ref 135–146)
TOTAL PROTEIN: 7.5 g/dL (ref 6.1–8.1)

## 2016-10-21 LAB — SEDIMENTATION RATE: Sed Rate: 4 mm/hr (ref 0–30)

## 2016-10-21 LAB — TSH: TSH: 0.79 mIU/L

## 2016-10-22 ENCOUNTER — Encounter: Payer: Self-pay | Admitting: Nurse Practitioner

## 2016-10-22 ENCOUNTER — Other Ambulatory Visit: Payer: Self-pay | Admitting: Family Medicine

## 2016-10-22 DIAGNOSIS — R634 Abnormal weight loss: Secondary | ICD-10-CM

## 2016-10-22 DIAGNOSIS — Z87891 Personal history of nicotine dependence: Secondary | ICD-10-CM

## 2016-10-22 LAB — HIV ANTIBODY (ROUTINE TESTING W REFLEX): HIV: NONREACTIVE

## 2016-10-22 LAB — HEPATITIS C ANTIBODY: HCV AB: NEGATIVE

## 2016-10-29 ENCOUNTER — Encounter: Payer: Self-pay | Admitting: Nurse Practitioner

## 2016-10-29 ENCOUNTER — Ambulatory Visit (INDEPENDENT_AMBULATORY_CARE_PROVIDER_SITE_OTHER): Payer: PPO | Admitting: Nurse Practitioner

## 2016-10-29 VITALS — BP 90/60 | HR 67 | Ht 66.0 in | Wt 118.4 lb

## 2016-10-29 DIAGNOSIS — R634 Abnormal weight loss: Secondary | ICD-10-CM

## 2016-10-29 NOTE — Patient Instructions (Addendum)
If you are age 62 or older, your body mass index should be between 23-30. Your Body mass index is 19.11 kg/m. If this is out of the aforementioned range listed, please consider follow up with your Primary Care Provider.  If you are age 73 or younger, your body mass index should be between 19-25. Your Body mass index is 19.11 kg/m. If this is out of the aformentioned range listed, please consider follow up with your Primary Care Provider.   You have been scheduled for an endoscopy. Please follow written instructions given to you at your visit today. If you use inhalers (even only as needed), please bring them with you on the day of your procedure. Your physician has requested that you go to www.startemmi.com and enter the access code given to you at your visit today. This web site gives a general overview about your procedure. However, you should still follow specific instructions given to you by our office regarding your preparation for the procedure.  Continue Boost three times a day.  Thank you for choosing [facility]

## 2016-10-29 NOTE — Progress Notes (Addendum)
HPI:  Patient is a 61 yo female known remotely to Dr. Carlean Purl. She was last seen in 2011 at time of screening colonoscopy with was normal.  She is referred by Dr. Vassie Moment for evaluation of weight loss. Labs 10/20/16 including chem profile, CBC, HIV were unremarkable.   Patient losing weight in June and is down about 40 pounds. She eats one meal a day and consumes only about 1/2 of that. She does drink Boost. Patient has nausea, bloating, diarrhea of other bowel changes. She had neck surgery many years ago, left her with some dysphagia but is asymptomatic on valium. No blood in stool. She has no abdominal pain, urinary symptoms, respiratory symptoms, chest pain or fatigue. No fevers. She didn't start any new medications around time weight loss started. She isn't depressed.    Past Medical History:  Diagnosis Date  . Anxiety   . Asymptomatic stenosis of right carotid artery    1-30%  . Cervicalgia   . Depression   . Fibromyalgia   . Fracture of ankle, bimalleolar, left, closed 08/10/2014  . Hyperlipidemia   . Lumbago   . Memory loss   . Migraines   . Neuralgic migraines   . Osteoporosis      Past Surgical History:  Procedure Laterality Date  . ABDOMINAL HYSTERECTOMY    . BACK SURGERY    . HAND SURGERY Bilateral   . LEG SURGERY Bilateral   . NECK SURGERY    . SHOULDER SURGERY Left    Family History  Problem Relation Age of Onset  . Heart attack Father   . Heart disease Father 87  . Alzheimer's disease Mother   . Stroke Mother   . Diabetes type II Brother   . Breast cancer Sister    Social History  Substance Use Topics  . Smoking status: Former Smoker    Types: Cigarettes  . Smokeless tobacco: Never Used     Comment: Quit in 1988  . Alcohol use No   Current Outpatient Prescriptions  Medication Sig Dispense Refill  . alendronate (FOSAMAX) 70 MG tablet Take 1 tablet (70 mg total) by mouth every 7 (seven) days. Take with a full glass of water on an empty  stomach. 12 tablet 8  . atorvastatin (LIPITOR) 20 MG tablet Take 1 tablet (20 mg total) by mouth daily. 90 tablet 1  . baclofen (LIORESAL) 10 MG tablet Take 10 mg by mouth 3 (three) times daily.     Marland Kitchen buPROPion (WELLBUTRIN XL) 300 MG 24 hr tablet Take 1 tablet (300 mg total) by mouth daily. 90 tablet 3  . diazepam (VALIUM) 5 MG tablet Take 1 tablet (5 mg total) by mouth 2 (two) times daily as needed. 180 tablet 0  . morphine (MSIR) 15 MG tablet Take 15 mg by mouth 4 (four) times daily.    . traZODone (DESYREL) 100 MG tablet Take 2 tablets (200 mg total) by mouth at bedtime. 180 tablet 1   No current facility-administered medications for this visit.    Allergies  Allergen Reactions  . Mobic [Meloxicam] Anaphylaxis  . Penicillins Anaphylaxis  . Nubain [Nalbuphine Hcl] Diarrhea and Nausea And Vomiting     Review of Systems: All systems reviewed and negative except where noted in HPI.    Physical Exam: BP 90/60   Pulse 67   Ht 5\' 6"  (1.676 m)   Wt 118 lb 6.4 oz (53.7 kg)   SpO2 95%   BMI 19.11 kg/m  Constitutional:  Thin white female in no acute distress. Psychiatric: Normal mood and affect. Behavior is normal. HEENT: Normocephalic and atraumatic. Conjunctivae are normal. No scleral icterus. Neck supple.  Cardiovascular: Normal rate, regular rhythm.  Pulmonary/chest: Effort normal and breath sounds normal. No wheezing, rales or rhonchi. Abdominal: Soft, nondistended, nontender. Bowel sounds active throughout. There are no masses palpable. No hepatomegaly. Extremities: no edema Lymphadenopathy: No cervical adenopathy noted. Neurological: Alert and oriented to person place and time. Skin: Skin is warm and dry. No rashes noted.   ASSESSMENT AND PLAN:  40. 61 year old female with 40 pound weight loss over last 9 months, currently at 118 pounds. Her appetite is poor but otherwise no symptoms. Her labs are all normal including an albumin of 4.7.  -will start with EGD. The risks and  benefits of EGD were discussed and the patient agrees to proceed.  -continue Boost 2-3 times daily -If EGD negative will consider CTscan to look for underlying malignancy   Tye Savoy, NP  10/29/2016, 10:48 AM  Cc:  Susy Frizzle, MD  Agree with Ms. Dylan Monforte's assessment and plan. Gatha Mayer, MD, [facility]

## 2016-10-31 ENCOUNTER — Ambulatory Visit (AMBULATORY_SURGERY_CENTER): Payer: PPO | Admitting: Internal Medicine

## 2016-10-31 ENCOUNTER — Encounter: Payer: Self-pay | Admitting: Internal Medicine

## 2016-10-31 VITALS — BP 115/64 | HR 65 | Temp 98.4°F | Resp 20 | Ht 66.0 in | Wt 118.0 lb

## 2016-10-31 DIAGNOSIS — B9681 Helicobacter pylori [H. pylori] as the cause of diseases classified elsewhere: Secondary | ICD-10-CM | POA: Diagnosis not present

## 2016-10-31 DIAGNOSIS — K2960 Other gastritis without bleeding: Secondary | ICD-10-CM | POA: Diagnosis not present

## 2016-10-31 DIAGNOSIS — K2970 Gastritis, unspecified, without bleeding: Secondary | ICD-10-CM | POA: Diagnosis not present

## 2016-10-31 DIAGNOSIS — R634 Abnormal weight loss: Secondary | ICD-10-CM

## 2016-10-31 MED ORDER — SODIUM CHLORIDE 0.9 % IV SOLN: 500.0000 mL | INTRAVENOUS | Status: DC

## 2016-10-31 NOTE — Op Note (Signed)
[facility] Patient Name: Meshawn Oconnor Procedure Date: 10/31/2016 3:08 PM MRN: [id number] Endoscopist: Gatha Mayer , MD Age: 61 Referring MD:  Date of Birth: 1956-05-01 Gender: Female Account #: [id number] Procedure:                Upper GI endoscopy Indications:              Anorexia, Early satiety, Weight loss Medicines:                Propofol per Anesthesia, Monitored Anesthesia Care Procedure:                Pre-Anesthesia Assessment:                           - Prior to the procedure, a History and Physical                            was performed, and patient medications and                            allergies were reviewed. The patient's tolerance of                            previous anesthesia was also reviewed. The risks                            and benefits of the procedure and the sedation                            options and risks were discussed with the patient.                            All questions were answered, and informed consent                            was obtained. Prior Anticoagulants: The patient has                            taken no previous anticoagulant or antiplatelet                            agents. ASA Grade Assessment: II - A patient with                            mild systemic disease. After reviewing the risks                            and benefits, the patient was deemed in                            satisfactory condition to undergo the procedure.                           After obtaining informed consent, the endoscope was  passed under direct vision. Throughout the                            procedure, the patient's blood pressure, pulse, and                            oxygen saturations were monitored continuously. The                            Endoscope was introduced through the mouth, and                            advanced to the second part of duodenum. The upper       GI endoscopy was accomplished without difficulty.                            The patient tolerated the procedure well. Scope In: Scope Out: Findings:                 Diffuse mild inflammation characterized by                            congestion (edema) and erythema was found in the                            entire examined stomach. Biopsies were taken with a                            cold forceps for histology. Verification of patient                            identification for the specimen was done. Estimated                            blood loss was minimal.                           The exam was otherwise without abnormality.                           The cardia and gastric fundus were otherwise normal                            on retroflexion. Complications:            No immediate complications. Estimated Blood Loss:     Estimated blood loss was minimal. Impression:               - Gastritis. Biopsied.                           - The examination was otherwise normal. Recommendation:           - Patient has a contact number available for  emergencies. The signs and symptoms of potential                            delayed complications were discussed with the                            patient. Return to normal activities tomorrow.                            Written discharge instructions were provided to the                            patient.                           - Resume previous diet.                           - Continue present medications.                           - Await pathology results.                           - I am inclined to think coming off buspirone (was                            stopped due to concern about sedation after a fall)                            is related to her sxs and weight loss. This is                            temporally associated. Buspirone helps treat early                            satiety and  impaired accomodation of the stomach                            (gastric muscle relaxation when accepting food).                            Await biopsies - may need to consider restarting. Gatha Mayer, MD 10/31/2016 3:32:17 PM This report has been signed electronically.

## 2016-10-31 NOTE — Progress Notes (Signed)
Called to room to assist during endoscopic procedure.  Patient ID and intended procedure confirmed with present staff. Received instructions for my participation in the procedure from the performing physician.  

## 2016-10-31 NOTE — Patient Instructions (Addendum)
   There was slight inflammation in the stomach. I took biopsies of it. However I think stopping the buspirone (was not a bad idea after your fall) is responsible for the way you feel. Might make sense to restart - will wait for biopsies then will discuss with you and Dr. Dennard Schaumann.  I appreciate the opportunity to care for you. Gatha Mayer, MD, [facility]  Handout given on Gastritis  YOU HAD AN ENDOSCOPIC PROCEDURE TODAY: Refer to the procedure report and other information in the discharge instructions given to you for any specific questions about what was found during the examination. If this information does not answer your questions, please call [facility] office at [phone] to clarify.   YOU SHOULD EXPECT: Some feelings of bloating in the abdomen. Passage of more gas than usual. Walking can help get rid of the air that was put into your GI tract during the procedure and reduce the bloating. If you had a lower endoscopy (such as a colonoscopy or flexible sigmoidoscopy) you may notice spotting of blood in your stool or on the toilet paper. Some abdominal soreness may be present for a day or two, also.  DIET: Your first meal following the procedure should be a light meal and then it is ok to progress to your normal diet. A half-sandwich or bowl of soup is an example of a good first meal. Heavy or fried foods are harder to digest and may make you feel nauseous or bloated. Drink plenty of fluids but you should avoid alcoholic beverages for 24 hours. If you had a esophageal dilation, please see attached instructions for diet.    ACTIVITY: Your care partner should take you home directly after the procedure. You should plan to take it easy, moving slowly for the rest of the day. You can resume normal activity the day after the procedure however YOU SHOULD NOT DRIVE, use power tools, machinery or perform tasks that involve climbing or major physical exertion for 24 hours (because of the sedation  medicines used during the test).   SYMPTOMS TO REPORT IMMEDIATELY: A gastroenterologist can be reached at any hour. Please call [phone]  for any of the following symptoms:   Following upper endoscopy (EGD, EUS, ERCP, esophageal dilation) Vomiting of blood or coffee ground material  New, significant abdominal pain  New, significant chest pain or pain under the shoulder blades  Painful or persistently difficult swallowing  New shortness of breath  Black, tarry-looking or red, bloody stools  FOLLOW UP:  If any biopsies were taken you will be contacted by phone or by letter within the next 1-3 weeks. Call [phone]  if you have not heard about the biopsies in 3 weeks.  Please also call with any specific questions about appointments or follow up tests.

## 2016-10-31 NOTE — Progress Notes (Signed)
A/ox3 pleased with MAC, report to Megan RN 

## 2016-10-31 NOTE — Progress Notes (Signed)
Pt's states no medical or surgical changes since previsit or office visit. 

## 2016-11-03 ENCOUNTER — Telehealth: Payer: Self-pay

## 2016-11-03 NOTE — Telephone Encounter (Signed)
  Follow up Call-  Call back number 10/31/2016  Post procedure Call Back phone  # [phone]  Permission to leave phone message Yes  Some recent data might be hidden     Patient questions:  Do you have a fever, pain , or abdominal swelling? No. Pain Score  0 *  Have you tolerated food without any problems? Yes.    Have you been able to return to your normal activities? Yes.    Do you have any questions about your discharge instructions: Diet   No. Medications  No. Follow up visit  No.  Do you have questions or concerns about your Care? No.  Actions: * If pain score is 4 or above: No action needed, pain <4.

## 2016-11-08 ENCOUNTER — Other Ambulatory Visit: Payer: Self-pay | Admitting: Family Medicine

## 2016-11-10 NOTE — Telephone Encounter (Signed)
ok 

## 2016-11-10 NOTE — Telephone Encounter (Signed)
Ok to refill??      Valium  

## 2016-11-11 ENCOUNTER — Encounter: Payer: Self-pay | Admitting: Internal Medicine

## 2016-11-11 ENCOUNTER — Other Ambulatory Visit: Payer: Self-pay

## 2016-11-11 DIAGNOSIS — A048 Other specified bacterial intestinal infections: Secondary | ICD-10-CM

## 2016-11-11 DIAGNOSIS — B9681 Helicobacter pylori [H. pylori] as the cause of diseases classified elsewhere: Secondary | ICD-10-CM | POA: Insufficient documentation

## 2016-11-11 DIAGNOSIS — K2970 Gastritis, unspecified, without bleeding: Secondary | ICD-10-CM

## 2016-11-11 MED ORDER — METRONIDAZOLE 250 MG PO TABS: 250.0000 mg | ORAL_TABLET | Freq: Four times a day (QID) | ORAL | 0 refills | Status: AC

## 2016-11-11 MED ORDER — OMEPRAZOLE 20 MG PO CPDR
20.0000 mg | DELAYED_RELEASE_CAPSULE | Freq: Two times a day (BID) | ORAL | 0 refills | Status: DC
Start: 2016-11-11 — End: 2017-03-26

## 2016-11-11 MED ORDER — BISMUTH SUBSALICYLATE 262 MG PO TABS: 2.0000 | ORAL_TABLET | Freq: Four times a day (QID) | ORAL | 0 refills | Status: AC

## 2016-11-11 MED ORDER — DOXYCYCLINE HYCLATE 100 MG PO CAPS: 100.0000 mg | ORAL_CAPSULE | Freq: Two times a day (BID) | ORAL | 0 refills | Status: AC

## 2016-11-11 NOTE — Addendum Note (Signed)
Addended by: Marlon Pel on: 11/11/2016 01:08 PM   Modules accepted: Orders

## 2016-11-11 NOTE — Telephone Encounter (Signed)
ok 

## 2016-11-13 DIAGNOSIS — M62830 Muscle spasm of back: Secondary | ICD-10-CM | POA: Diagnosis not present

## 2016-11-13 DIAGNOSIS — M7552 Bursitis of left shoulder: Secondary | ICD-10-CM | POA: Diagnosis not present

## 2016-11-13 DIAGNOSIS — M47817 Spondylosis without myelopathy or radiculopathy, lumbosacral region: Secondary | ICD-10-CM | POA: Diagnosis not present

## 2016-11-13 DIAGNOSIS — G894 Chronic pain syndrome: Secondary | ICD-10-CM | POA: Diagnosis not present

## 2016-11-13 DIAGNOSIS — M47812 Spondylosis without myelopathy or radiculopathy, cervical region: Secondary | ICD-10-CM | POA: Diagnosis not present

## 2016-12-11 DIAGNOSIS — M47812 Spondylosis without myelopathy or radiculopathy, cervical region: Secondary | ICD-10-CM | POA: Diagnosis not present

## 2016-12-11 DIAGNOSIS — G894 Chronic pain syndrome: Secondary | ICD-10-CM | POA: Diagnosis not present

## 2016-12-11 DIAGNOSIS — M47817 Spondylosis without myelopathy or radiculopathy, lumbosacral region: Secondary | ICD-10-CM | POA: Diagnosis not present

## 2016-12-11 DIAGNOSIS — M62830 Muscle spasm of back: Secondary | ICD-10-CM | POA: Diagnosis not present

## 2016-12-15 DIAGNOSIS — G5602 Carpal tunnel syndrome, left upper limb: Secondary | ICD-10-CM | POA: Diagnosis not present

## 2016-12-15 DIAGNOSIS — M5412 Radiculopathy, cervical region: Secondary | ICD-10-CM | POA: Diagnosis not present

## 2017-01-06 ENCOUNTER — Other Ambulatory Visit: Payer: Self-pay | Admitting: Family Medicine

## 2017-01-06 DIAGNOSIS — F330 Major depressive disorder, recurrent, mild: Secondary | ICD-10-CM

## 2017-01-06 NOTE — Telephone Encounter (Signed)
Ok to refill 

## 2017-01-06 NOTE — Telephone Encounter (Signed)
ok 

## 2017-01-12 ENCOUNTER — Other Ambulatory Visit: Payer: PPO

## 2017-01-12 DIAGNOSIS — K5900 Constipation, unspecified: Secondary | ICD-10-CM | POA: Diagnosis not present

## 2017-01-12 DIAGNOSIS — M7552 Bursitis of left shoulder: Secondary | ICD-10-CM | POA: Diagnosis not present

## 2017-01-12 DIAGNOSIS — Z79891 Long term (current) use of opiate analgesic: Secondary | ICD-10-CM | POA: Diagnosis not present

## 2017-01-12 DIAGNOSIS — M47812 Spondylosis without myelopathy or radiculopathy, cervical region: Secondary | ICD-10-CM | POA: Diagnosis not present

## 2017-01-12 DIAGNOSIS — M1711 Unilateral primary osteoarthritis, right knee: Secondary | ICD-10-CM | POA: Diagnosis not present

## 2017-01-12 DIAGNOSIS — M791 Myalgia: Secondary | ICD-10-CM | POA: Diagnosis not present

## 2017-01-12 DIAGNOSIS — M47817 Spondylosis without myelopathy or radiculopathy, lumbosacral region: Secondary | ICD-10-CM | POA: Diagnosis not present

## 2017-01-12 DIAGNOSIS — M62830 Muscle spasm of back: Secondary | ICD-10-CM | POA: Diagnosis not present

## 2017-01-12 DIAGNOSIS — G894 Chronic pain syndrome: Secondary | ICD-10-CM | POA: Diagnosis not present

## 2017-02-19 ENCOUNTER — Other Ambulatory Visit: Payer: Self-pay | Admitting: Family Medicine

## 2017-02-19 NOTE — Telephone Encounter (Signed)
ok 

## 2017-02-19 NOTE — Telephone Encounter (Signed)
Ok to refill 

## 2017-02-20 ENCOUNTER — Other Ambulatory Visit: Payer: PPO

## 2017-02-20 ENCOUNTER — Other Ambulatory Visit: Payer: Self-pay | Admitting: Internal Medicine

## 2017-02-20 DIAGNOSIS — A048 Other specified bacterial intestinal infections: Secondary | ICD-10-CM

## 2017-02-20 NOTE — Telephone Encounter (Signed)
Medication called to pharmacy. 

## 2017-02-23 DIAGNOSIS — G894 Chronic pain syndrome: Secondary | ICD-10-CM | POA: Diagnosis not present

## 2017-02-23 LAB — HELICOBACTER PYLORI  SPECIAL ANTIGEN: H. PYLORI Antigen: NOT DETECTED

## 2017-02-24 NOTE — Progress Notes (Signed)
H pylori is gone.  How is she doing?

## 2017-03-16 DIAGNOSIS — M62830 Muscle spasm of back: Secondary | ICD-10-CM | POA: Diagnosis not present

## 2017-03-16 DIAGNOSIS — M47812 Spondylosis without myelopathy or radiculopathy, cervical region: Secondary | ICD-10-CM | POA: Diagnosis not present

## 2017-03-16 DIAGNOSIS — M47817 Spondylosis without myelopathy or radiculopathy, lumbosacral region: Secondary | ICD-10-CM | POA: Diagnosis not present

## 2017-03-16 DIAGNOSIS — G894 Chronic pain syndrome: Secondary | ICD-10-CM | POA: Diagnosis not present

## 2017-03-23 ENCOUNTER — Ambulatory Visit (INDEPENDENT_AMBULATORY_CARE_PROVIDER_SITE_OTHER): Payer: PPO | Admitting: Family Medicine

## 2017-03-23 ENCOUNTER — Encounter: Payer: Self-pay | Admitting: Family Medicine

## 2017-03-23 VITALS — BP 96/60 | HR 76 | Temp 98.3°F | Resp 18 | Wt 114.0 lb

## 2017-03-23 DIAGNOSIS — L02519 Cutaneous abscess of unspecified hand: Secondary | ICD-10-CM | POA: Diagnosis not present

## 2017-03-23 DIAGNOSIS — L03119 Cellulitis of unspecified part of limb: Secondary | ICD-10-CM

## 2017-03-23 MED ORDER — SULFAMETHOXAZOLE-TRIMETHOPRIM 800-160 MG PO TABS: 1.0000 | ORAL_TABLET | Freq: Two times a day (BID) | ORAL | 0 refills | Status: DC

## 2017-03-23 NOTE — Progress Notes (Signed)
Subjective:    Patient ID: Julie Neal, female    DOB: Feb 17, 1956, 61 y.o.   MRN: [id number]  HPI  06/2016 Patient is here today asking me to manage her depression. She has a history of major depressive disorder. She is currently on a combination of Wellbutrin sustained release 300 mg by mouth every morning, trazodone 200 mg by mouth daily at bedtime for insomnia and BuSpar 10 mg 4 times a day for anxiety and depression. Does that this combination seems to be working well for her. She has been on the 4 times a day BuSpar for 6 months. Recently I saw the patient after she fell and sustained a laceration. She also recently had a bone density in January revealing severe osteoporosis in her right hip with a T score of -6.4. I had a long discussion today with the patient regarding polypharmacy, her increased risk of falls, and her severe osteoporosis, and the chance that she could sustain a hip fracture. She is on morphine 15 mg 4 times a day. 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 She is on Zanaflex 4 times a day for muscle spasms in her back. She also has a history of hyperlipidemia and she is due for fasting lipid panel.  AT that time, my plan was: Spent 20 minutes discussing the patient's medications with her and the risk of falls due to polypharmacy. Given her severe osteoporosis, I am concerned if she fails she will likely sustained a hip fracture even know she is on Fosamax. Therefore I recommended that we try to wean off some of her medications or at least try switching the medications without the risk of sedation. Therefore I recommended decreasing BuSpar 5 mg 4 times a day and recheck in one month to see how she's doing. I would like to try to wean off this medication gradually. If she is tolerating this, I would suggest trying Cymbalta both to help with depression and anxiety but also to possibly help manage her  fibromyalgia and chronic pain. With this perhaps we can discontinue either to tizanidine or Valium. The patient is willing to try  10/20/16 Over the last 6 months, the patient's lost approximately 30 pounds. She reports no appetite and early satiety. She has diffuse widespread pain that she has a history of fibromyalgia. She also reports headaches that she has a history of severe migraines. She denies any other symptoms. She denies any fevers, night sweats, easy bruising. She denies any melena or hematochezia. She denies any nausea vomiting or diarrhea. She denies any cough or hemoptysis or chest pain or pleurisy. She denies any polyuria, polydipsia. She denies any depression, anhedonia, symptoms of anorexia, or bulimia. She denies any palpitations or tachycardia.  At that time, my plan was: Differential diagnosis includes cancer, endocrinology problems like diabetes or hyperthyroidism, psychological problems like depression, or infections like HIV. I will screen the patient for HIV and hepatitis C given her age. She denies any depression or psychologic problems. I will screen her for hyperthyroidism with a TSH and also check a CMP to check for kidney failure, liver failure, or signs of diabetes. I'll screen her for leukemia with a CBC or other bone marrow abnormalities. Consult GI for an EGD and a colonoscopy. Last colonoscopy was in 2011. Affect with results of lab work, we may need to proceed with scanning including a CT scan of the chest given  her history of smoking to screen for lung cancer. Await the results of lab work first  03/23/17 Labs were nml.  EGD showed inflammation but no significant findings.  Dr. Fredrich Birks postulated stopped buspar may have caused the weight loss.   Wt Readings from Last 3 Encounters:  03/23/17 114 lb (51.7 kg)  10/31/16 118 lb (53.5 kg)  10/29/16 118 lb 6.4 oz (53.7 kg)   Unfortunately the patient continues to lose weight. However today's visit is preemptive by a more  pressing matter. She sustained a cut on the dorsum of her left fourth digit proximal to the PIP joint last week. There is now a well-circumscribed erythema warmth and pain on the dorsum of the left fourth digit around the PIP joint. The diameter of the redness is 3 cm x 2.5 cm. She appears to have cellulitis. She is still able to bend the joint although it causes pain. There is no apparent joint effusion. Past Medical History:  Diagnosis Date  . Anxiety   . Asymptomatic stenosis of right carotid artery    1-30%  . Cervicalgia   . Depression   . Fibromyalgia   . Fracture of ankle, bimalleolar, left, closed 08/10/2014  . Helicobacter pylori gastritis 10/2016  . Hyperlipidemia   . Lumbago   . Memory loss   . Migraines   . Neuralgic migraines   . Osteoporosis    Past Surgical History:  Procedure Laterality Date  . ABDOMINAL HYSTERECTOMY    . BACK SURGERY    . HAND SURGERY Bilateral   . LEG SURGERY Bilateral   . NECK SURGERY    . SHOULDER SURGERY Left    Current Outpatient Prescriptions on File Prior to Visit  Medication Sig Dispense Refill  . alendronate (FOSAMAX) 70 MG tablet TAKE 1 TABLET BY MOUTH EVERY 7 DAYS WITH FULL GLASS OF WATER ON EMPTY STOMACH 12 tablet 4  . atorvastatin (LIPITOR) 20 MG tablet TAKE 1 TABLET (20 MG TOTAL) BY MOUTH DAILY. 90 tablet 1  . baclofen (LIORESAL) 10 MG tablet Take 10 mg by mouth 3 (three) times daily.     Marland Kitchen buPROPion (WELLBUTRIN XL) 300 MG 24 hr tablet Take 1 tablet (300 mg total) by mouth daily. 90 tablet 3  . diazepam (VALIUM) 5 MG tablet TAKE 1 TABLET TWICE A DAY AS NEEDED 180 tablet 0  . morphine (MSIR) 15 MG tablet Take 15 mg by mouth 4 (four) times daily.    Marland Kitchen omeprazole (PRILOSEC) 20 MG capsule Take 1 capsule (20 mg total) by mouth 2 (two) times daily. 28 capsule 0  . traZODone (DESYREL) 100 MG tablet TAKE 2 TABLETS BY MOUTH AT BEDTIME 180 tablet 1   Current Facility-Administered Medications on File Prior to Visit  Medication Dose Route  Frequency Provider Last Rate Last Dose  . 0.9 %  sodium chloride infusion  500 mL Intravenous Continuous Gatha Mayer, MD       Allergies  Allergen Reactions  . Mobic [Meloxicam] Anaphylaxis  . Penicillins Anaphylaxis  . Nubain [Nalbuphine Hcl] Diarrhea and Nausea And Vomiting   Social History   Social History  . Marital status: Married    Spouse name: [name]  . Number of children: 1  . Years of education: college   Occupational History  .      disabled   Social History Main Topics  . Smoking status: Former Smoker    Types: Cigarettes  . Smokeless tobacco: Never Used     Comment: Quit in 1988  .  Alcohol use No  . Drug use: No  . Sexual activity: Not on file   Other Topics Concern  . Not on file   Social History Narrative   Patient lives at home with her husband [name])  and has a college education. Patient has one child.   Patient is disabled. Right handed.   Caffeine one cup of coffee.      Review of Systems  All other systems reviewed and are negative.      Objective:   Physical Exam  Constitutional: She is oriented to person, place, and time.  Cardiovascular: Normal rate, regular rhythm and normal heart sounds.   Pulmonary/Chest: Effort normal and breath sounds normal. No respiratory distress. She has no wheezes. She has no rales.  Abdominal: Soft. Bowel sounds are normal.  Musculoskeletal:       Hands: Neurological: She is alert and oriented to person, place, and time.  Psychiatric: She has a normal mood and affect. Her behavior is normal. Judgment and thought content normal.  Vitals reviewed.         Assessment & Plan:  Cellulitis and abscess of hand - Plan: sulfamethoxazole-trimethoprim (BACTRIM DS,SEPTRA DS) 800-160 MG tablet  Patient appears to have developed cellulitis. Tetanus shot was less than 1 year ago. I will treat her with Bactrim double strength tablets by mouth twice a day for 7 days. Cannot completely exclude septic arthritis at  this time. If situations worsens, sheis seeking medical attention immediately. Otherwise I will see the patient in 48 hours for recheck

## 2017-03-26 ENCOUNTER — Ambulatory Visit (INDEPENDENT_AMBULATORY_CARE_PROVIDER_SITE_OTHER): Payer: PPO | Admitting: Family Medicine

## 2017-03-26 ENCOUNTER — Encounter: Payer: Self-pay | Admitting: Family Medicine

## 2017-03-26 VITALS — BP 100/62 | HR 72 | Temp 98.3°F | Resp 16 | Ht 66.0 in | Wt 114.0 lb

## 2017-03-26 DIAGNOSIS — R21 Rash and other nonspecific skin eruption: Secondary | ICD-10-CM

## 2017-03-26 DIAGNOSIS — M2550 Pain in unspecified joint: Secondary | ICD-10-CM

## 2017-03-26 DIAGNOSIS — L0390 Cellulitis, unspecified: Secondary | ICD-10-CM | POA: Diagnosis not present

## 2017-03-26 MED ORDER — CLOBETASOL PROP EMOLLIENT BASE 0.05 % EX CREA: 1.0000 "application " | TOPICAL_CREAM | Freq: Two times a day (BID) | CUTANEOUS | 0 refills | Status: DC

## 2017-03-26 NOTE — Progress Notes (Signed)
Subjective:    Patient ID: Julie Neal, female    DOB: Feb 17, 1956, 61 y.o.   MRN: [id number]  HPI  06/2016 Patient is here today asking me to manage her depression. She has a history of major depressive disorder. She is currently on a combination of Wellbutrin sustained release 300 mg by mouth every morning, trazodone 200 mg by mouth daily at bedtime for insomnia and BuSpar 10 mg 4 times a day for anxiety and depression. Does that this combination seems to be working well for her. She has been on the 4 times a day BuSpar for 6 months. Recently I saw the patient after she fell and sustained a laceration. She also recently had a bone density in January revealing severe osteoporosis in her right hip with a T score of -6.4. I had a long discussion today with the patient regarding polypharmacy, her increased risk of falls, and her severe osteoporosis, and the chance that she could sustain a hip fracture. She is on morphine 15 mg 4 times a day. 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 She is on Zanaflex 4 times a day for muscle spasms in her back. She also has a history of hyperlipidemia and she is due for fasting lipid panel.  AT that time, my plan was: Spent 20 minutes discussing the patient's medications with her and the risk of falls due to polypharmacy. Given her severe osteoporosis, I am concerned if she fails she will likely sustained a hip fracture even know she is on Fosamax. Therefore I recommended that we try to wean off some of her medications or at least try switching the medications without the risk of sedation. Therefore I recommended decreasing BuSpar 5 mg 4 times a day and recheck in one month to see how she's doing. I would like to try to wean off this medication gradually. If she is tolerating this, I would suggest trying Cymbalta both to help with depression and anxiety but also to possibly help manage her  fibromyalgia and chronic pain. With this perhaps we can discontinue either to tizanidine or Valium. The patient is willing to try  10/20/16 Over the last 6 months, the patient's lost approximately 30 pounds. She reports no appetite and early satiety. She has diffuse widespread pain that she has a history of fibromyalgia. She also reports headaches that she has a history of severe migraines. She denies any other symptoms. She denies any fevers, night sweats, easy bruising. She denies any melena or hematochezia. She denies any nausea vomiting or diarrhea. She denies any cough or hemoptysis or chest pain or pleurisy. She denies any polyuria, polydipsia. She denies any depression, anhedonia, symptoms of anorexia, or bulimia. She denies any palpitations or tachycardia.  At that time, my plan was: Differential diagnosis includes cancer, endocrinology problems like diabetes or hyperthyroidism, psychological problems like depression, or infections like HIV. I will screen the patient for HIV and hepatitis C given her age. She denies any depression or psychologic problems. I will screen her for hyperthyroidism with a TSH and also check a CMP to check for kidney failure, liver failure, or signs of diabetes. I'll screen her for leukemia with a CBC or other bone marrow abnormalities. Consult GI for an EGD and a colonoscopy. Last colonoscopy was in 2011. Affect with results of lab work, we may need to proceed with scanning including a CT scan of the chest given  her history of smoking to screen for lung cancer. Await the results of lab work first  03/23/17 Labs were nml.  EGD showed inflammation but no significant findings.  Dr. Fredrich Birks postulated that stopping buspar may have caused the weight loss.   Wt Readings from Last 3 Encounters:  03/23/17 114 lb (51.7 kg)  10/31/16 118 lb (53.5 kg)  10/29/16 118 lb 6.4 oz (53.7 kg)   Unfortunately the patient continues to lose weight. However today's visit is preemptive by a  more pressing matter. She sustained a cut on the dorsum of her left fourth digit proximal to the PIP joint last week. There is now a well-circumscribed erythema warmth and pain on the dorsum of the left fourth digit around the PIP joint. The diameter of the redness is 3 cm x 2.5 cm. She appears to have cellulitis. She is still able to bend the joint although it causes pain. There is no apparent joint effusion.  At that time, my plan was: Patient appears to have developed cellulitis. Tetanus shot was less than 1 year ago. I will treat her with Bactrim double strength tablets by mouth twice a day for 7 days. Cannot completely exclude septic arthritis at this time. If situations worsens, she is to seek medical attention immediately. Otherwise I will see the patient in 48 hours for recheck  03/26/17 Here for recheck.  The patch on the dorsum of the finger has essentially remained the same. It is still 3 cm x 2.5 cm. The skin is itching. It is no longer bright erythematous. It is now more of a purplish burgundy. There is significant scaling and cracking of the skin. There are actually small pinpoint vesicles forming on the lateral edges of the PIP joint. She continues to complain of pain with range of motion  Past Medical History:  Diagnosis Date  . Anxiety   . Asymptomatic stenosis of right carotid artery    1-30%  . Cervicalgia   . Depression   . Fibromyalgia   . Fracture of ankle, bimalleolar, left, closed 08/10/2014  . Helicobacter pylori gastritis 10/2016  . Hyperlipidemia   . Lumbago   . Memory loss   . Migraines   . Neuralgic migraines   . Osteoporosis    Past Surgical History:  Procedure Laterality Date  . ABDOMINAL HYSTERECTOMY    . BACK SURGERY    . HAND SURGERY Bilateral   . LEG SURGERY Bilateral   . NECK SURGERY    . SHOULDER SURGERY Left    Current Outpatient Prescriptions on File Prior to Visit  Medication Sig Dispense Refill  . alendronate (FOSAMAX) 70 MG tablet TAKE 1  TABLET BY MOUTH EVERY 7 DAYS WITH FULL GLASS OF WATER ON EMPTY STOMACH 12 tablet 4  . atorvastatin (LIPITOR) 20 MG tablet TAKE 1 TABLET (20 MG TOTAL) BY MOUTH DAILY. 90 tablet 1  . baclofen (LIORESAL) 10 MG tablet Take 10 mg by mouth 3 (three) times daily.     Marland Kitchen buPROPion (WELLBUTRIN XL) 300 MG 24 hr tablet Take 1 tablet (300 mg total) by mouth daily. 90 tablet 3  . diazepam (VALIUM) 5 MG tablet TAKE 1 TABLET TWICE A DAY AS NEEDED 180 tablet 0  . morphine (MSIR) 15 MG tablet Take 15 mg by mouth 4 (four) times daily.    Marland Kitchen omeprazole (PRILOSEC) 20 MG capsule Take 1 capsule (20 mg total) by mouth 2 (two) times daily. (Patient not taking: Reported on 03/23/2017) 28 capsule 0  . sulfamethoxazole-trimethoprim (  BACTRIM DS,SEPTRA DS) 800-160 MG tablet Take 1 tablet by mouth 2 (two) times daily. 14 tablet 0  . tiZANidine (ZANAFLEX) 4 MG tablet TAKE 1 TABLET BY MOUTH 4 TIMES A DAY AS NEEDED  0  . traZODone (DESYREL) 100 MG tablet TAKE 2 TABLETS BY MOUTH AT BEDTIME 180 tablet 1   Current Facility-Administered Medications on File Prior to Visit  Medication Dose Route Frequency Provider Last Rate Last Dose  . 0.9 %  sodium chloride infusion  500 mL Intravenous Continuous Gatha Mayer, MD       Allergies  Allergen Reactions  . Mobic [Meloxicam] Anaphylaxis  . Penicillins Anaphylaxis  . Nubain [Nalbuphine Hcl] Diarrhea and Nausea And Vomiting   Social History   Social History  . Marital status: Married    Spouse name: [name]  . Number of children: 1  . Years of education: college   Occupational History  .      disabled   Social History Main Topics  . Smoking status: Former Smoker    Types: Cigarettes  . Smokeless tobacco: Never Used     Comment: Quit in 1988  . Alcohol use No  . Drug use: No  . Sexual activity: Not on file   Other Topics Concern  . Not on file   Social History Narrative   Patient lives at home with her husband [name])  and has a college education. Patient has one  child.   Patient is disabled. Right handed.   Caffeine one cup of coffee.      Review of Systems  All other systems reviewed and are negative.      Objective:   Physical Exam  Constitutional: She is oriented to person, place, and time.  Cardiovascular: Normal rate, regular rhythm and normal heart sounds.   Pulmonary/Chest: Effort normal and breath sounds normal. No respiratory distress. She has no wheezes. She has no rales.  Abdominal: Soft. Bowel sounds are normal.  Musculoskeletal:       Hands: Neurological: She is alert and oriented to person, place, and time.  Psychiatric: She has a normal mood and affect. Her behavior is normal. Judgment and thought content normal.  Vitals reviewed.         Assessment & Plan:  At this point, I do not believe this is cellulitis nor septic arthritis. I will consult a hand surgeon for second opinion. However I see no effusion that needs to be drained today. I will treat the skin changes with clobetasol cream bid in case this is some type of atopic dermatitis because that is what skin changes appear to be. I have asked the patient to continue the Bactrim until seen by the hand surgeon

## 2017-04-23 DIAGNOSIS — M47812 Spondylosis without myelopathy or radiculopathy, cervical region: Secondary | ICD-10-CM | POA: Diagnosis not present

## 2017-04-23 DIAGNOSIS — G894 Chronic pain syndrome: Secondary | ICD-10-CM | POA: Diagnosis not present

## 2017-04-23 DIAGNOSIS — M47817 Spondylosis without myelopathy or radiculopathy, lumbosacral region: Secondary | ICD-10-CM | POA: Diagnosis not present

## 2017-04-23 DIAGNOSIS — M62830 Muscle spasm of back: Secondary | ICD-10-CM | POA: Diagnosis not present

## 2017-05-07 ENCOUNTER — Telehealth: Payer: Self-pay | Admitting: Family Medicine

## 2017-05-07 NOTE — Telephone Encounter (Signed)
Requesting refill on Valium - Ok to refill??

## 2017-05-08 MED ORDER — DIAZEPAM 5 MG PO TABS: 5.0000 mg | ORAL_TABLET | Freq: Two times a day (BID) | ORAL | 0 refills | Status: DC | PRN

## 2017-05-08 NOTE — Telephone Encounter (Signed)
ok 

## 2017-05-08 NOTE — Telephone Encounter (Signed)
Medication called/sent to requested pharmacy  

## 2017-05-15 ENCOUNTER — Encounter: Payer: Self-pay | Admitting: Family Medicine

## 2017-05-15 ENCOUNTER — Ambulatory Visit (INDEPENDENT_AMBULATORY_CARE_PROVIDER_SITE_OTHER): Payer: PPO | Admitting: Family Medicine

## 2017-05-15 VITALS — BP 98/60 | HR 81 | Temp 98.1°F | Resp 14 | Ht 66.0 in | Wt 111.0 lb

## 2017-05-15 DIAGNOSIS — Z23 Encounter for immunization: Secondary | ICD-10-CM

## 2017-05-15 DIAGNOSIS — M79645 Pain in left finger(s): Secondary | ICD-10-CM

## 2017-05-15 MED ORDER — MEGESTROL ACETATE 400 MG/10ML PO SUSP: 400.0000 mg | Freq: Every day | ORAL | 0 refills | Status: DC

## 2017-05-15 MED ORDER — DIAZEPAM 5 MG PO TABS: 5.0000 mg | ORAL_TABLET | Freq: Two times a day (BID) | ORAL | 0 refills | Status: DC | PRN

## 2017-05-15 NOTE — Progress Notes (Signed)
Subjective:    Patient ID: Julie Neal, female    DOB: Feb 17, 1956, 61 y.o.   MRN: [id number]  HPI  06/2016 Patient is here today asking me to manage her depression. She has a history of major depressive disorder. She is currently on a combination of Wellbutrin sustained release 300 mg by mouth every morning, trazodone 200 mg by mouth daily at bedtime for insomnia and BuSpar 10 mg 4 times a day for anxiety and depression. Does that this combination seems to be working well for her. She has been on the 4 times a day BuSpar for 6 months. Recently I saw the patient after she fell and sustained a laceration. She also recently had a bone density in January revealing severe osteoporosis in her right hip with a T score of -6.4. I had a long discussion today with the patient regarding polypharmacy, her increased risk of falls, and her severe osteoporosis, and the chance that she could sustain a hip fracture. She is on morphine 15 mg 4 times a day. 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 She is on Zanaflex 4 times a day for muscle spasms in her back. She also has a history of hyperlipidemia and she is due for fasting lipid panel.  AT that time, my plan was: Spent 20 minutes discussing the patient's medications with her and the risk of falls due to polypharmacy. Given her severe osteoporosis, I am concerned if she fails she will likely sustained a hip fracture even know she is on Fosamax. Therefore I recommended that we try to wean off some of her medications or at least try switching the medications without the risk of sedation. Therefore I recommended decreasing BuSpar 5 mg 4 times a day and recheck in one month to see how she's doing. I would like to try to wean off this medication gradually. If she is tolerating this, I would suggest trying Cymbalta both to help with depression and anxiety but also to possibly help manage her  fibromyalgia and chronic pain. With this perhaps we can discontinue either to tizanidine or Valium. The patient is willing to try  10/20/16 Over the last 6 months, the patient's lost approximately 30 pounds. She reports no appetite and early satiety. She has diffuse widespread pain that she has a history of fibromyalgia. She also reports headaches that she has a history of severe migraines. She denies any other symptoms. She denies any fevers, night sweats, easy bruising. She denies any melena or hematochezia. She denies any nausea vomiting or diarrhea. She denies any cough or hemoptysis or chest pain or pleurisy. She denies any polyuria, polydipsia. She denies any depression, anhedonia, symptoms of anorexia, or bulimia. She denies any palpitations or tachycardia.  At that time, my plan was: Differential diagnosis includes cancer, endocrinology problems like diabetes or hyperthyroidism, psychological problems like depression, or infections like HIV. I will screen the patient for HIV and hepatitis C given her age. She denies any depression or psychologic problems. I will screen her for hyperthyroidism with a TSH and also check a CMP to check for kidney failure, liver failure, or signs of diabetes. I'll screen her for leukemia with a CBC or other bone marrow abnormalities. Consult GI for an EGD and a colonoscopy. Last colonoscopy was in 2011. Affect with results of lab work, we may need to proceed with scanning including a CT scan of the chest given  her history of smoking to screen for lung cancer. Await the results of lab work first  03/23/17 Labs were nml.  EGD showed inflammation but no significant findings.  Dr. Fredrich Birks postulated that stopping buspar may have caused the weight loss.   Wt Readings from Last 3 Encounters:  03/26/17 114 lb (51.7 kg)  03/23/17 114 lb (51.7 kg)  10/31/16 118 lb (53.5 kg)   Unfortunately the patient continues to lose weight. However today's visit is preemptive by a more  pressing matter. She sustained a cut on the dorsum of her left fourth digit proximal to the PIP joint last week. There is now a well-circumscribed erythema warmth and pain on the dorsum of the left fourth digit around the PIP joint. The diameter of the redness is 3 cm x 2.5 cm. She appears to have cellulitis. She is still able to bend the joint although it causes pain. There is no apparent joint effusion.  At that time, my plan was: Patient appears to have developed cellulitis. Tetanus shot was less than 1 year ago. I will treat her with Bactrim double strength tablets by mouth twice a day for 7 days. Cannot completely exclude septic arthritis at this time. If situations worsens, she is to seek medical attention immediately. Otherwise I will see the patient in 48 hours for recheck  03/26/17 Here for recheck.  The patch on the dorsum of the finger has essentially remained the same. It is still 3 cm x 2.5 cm. The skin is itching. It is no longer bright erythematous. It is now more of a purplish burgundy. There is significant scaling and cracking of the skin. There are actually small pinpoint vesicles forming on the lateral edges of the PIP joint. She continues to complain of pain with range of motion.  At that time, my plan was: At this point, I do not believe this is cellulitis nor septic arthritis. I will consult a hand surgeon for second opinion. However I see no effusion that needs to be drained today. I will treat the skin changes with clobetasol cream bid in case this is some type of atopic dermatitis because that is what skin changes appear to be. I have asked the patient to continue the Bactrim until seen by the hand surgeon  05/15/17 The redness and swelling on the dorsum of her left fourth PIP joint has completely subsided. However she is unable to flex the PIP joint greater than 45 due to pain on the radial aspect of the joint. She never saw the hand surgeon. She states that she never heard from our  office however she did not contact us regarding the confusion. She continues to complain with pain with range of motion.  She also continues to demonstrate weight loss. Despite GI referral and normal lab work earlier this year, the patient continues to lose weight as she states because she simply does not eat. She has no appetite. She has no desire to eat. She denies any depression or SI.  Past Medical History:  Diagnosis Date  . Anxiety   . Asymptomatic stenosis of right carotid artery    1-30%  . Cervicalgia   . Depression   . Fibromyalgia   . Fracture of ankle, bimalleolar, left, closed 08/10/2014  . Helicobacter pylori gastritis 10/2016  . Hyperlipidemia   . Lumbago   . Memory loss   . Migraines   . Neuralgic migraines   . Osteoporosis    Past Surgical History:  Procedure Laterality Date  .  ABDOMINAL HYSTERECTOMY    . BACK SURGERY    . HAND SURGERY Bilateral   . LEG SURGERY Bilateral   . NECK SURGERY    . SHOULDER SURGERY Left    Current Outpatient Prescriptions on File Prior to Visit  Medication Sig Dispense Refill  . alendronate (FOSAMAX) 70 MG tablet TAKE 1 TABLET BY MOUTH EVERY 7 DAYS WITH FULL GLASS OF WATER ON EMPTY STOMACH 12 tablet 4  . atorvastatin (LIPITOR) 20 MG tablet TAKE 1 TABLET (20 MG TOTAL) BY MOUTH DAILY. 90 tablet 1  . buPROPion (WELLBUTRIN XL) 300 MG 24 hr tablet Take 1 tablet (300 mg total) by mouth daily. 90 tablet 3  . Clobetasol Prop Emollient Base (CLOBETASOL PROPIONATE E) 0.05 % emollient cream Apply 1 application topically 2 (two) times daily. 30 g 0  . diazepam (VALIUM) 5 MG tablet Take 1 tablet (5 mg total) by mouth 2 (two) times daily as needed. 180 tablet 0  . morphine (MSIR) 15 MG tablet Take 15 mg by mouth 4 (four) times daily.    Marland Kitchen sulfamethoxazole-trimethoprim (BACTRIM DS,SEPTRA DS) 800-160 MG tablet Take 1 tablet by mouth 2 (two) times daily. 14 tablet 0  . tiZANidine (ZANAFLEX) 4 MG tablet TAKE 1 TABLET BY MOUTH 4 TIMES A DAY AS NEEDED  0    . traZODone (DESYREL) 100 MG tablet TAKE 2 TABLETS BY MOUTH AT BEDTIME 180 tablet 1   Current Facility-Administered Medications on File Prior to Visit  Medication Dose Route Frequency Provider Last Rate Last Dose  . 0.9 %  sodium chloride infusion  500 mL Intravenous Continuous Gatha Mayer, MD       Allergies  Allergen Reactions  . Mobic [Meloxicam] Anaphylaxis  . Penicillins Anaphylaxis  . Nubain [Nalbuphine Hcl] Diarrhea and Nausea And Vomiting   Social History   Social History  . Marital status: Married    Spouse name: [name]  . Number of children: 1  . Years of education: college   Occupational History  .      disabled   Social History Main Topics  . Smoking status: Former Smoker    Types: Cigarettes  . Smokeless tobacco: Never Used     Comment: Quit in 1988  . Alcohol use No  . Drug use: No  . Sexual activity: Not on file   Other Topics Concern  . Not on file   Social History Narrative   Patient lives at home with her husband [name])  and has a college education. Patient has one child.   Patient is disabled. Right handed.   Caffeine one cup of coffee.      Review of Systems  All other systems reviewed and are negative.      Objective:   Physical Exam  Constitutional: She is oriented to person, place, and time.  Cardiovascular: Normal rate, regular rhythm and normal heart sounds.   Pulmonary/Chest: Effort normal and breath sounds normal. No respiratory distress. She has no wheezes. She has no rales.  Abdominal: Soft. Bowel sounds are normal.  Musculoskeletal:       Hands: Neurological: She is alert and oriented to person, place, and time.  Psychiatric: She has a normal mood and affect. Her behavior is normal. Judgment and thought content normal.  Vitals reviewed. Red patch descried above has completely resolved but she has pain with rom in te 4th pip joint and flexion limited to 45 degrees.          Assessment & Plan:  Finger  pain, left -  Plan: DG Hand Complete Left  Flu vaccine need - Plan: Flu Vaccine QUAD 36+ mos IM  Patient sent for xray to evaluate further the pain in her 4th pip joint.  She will need to see hand specialist.  I have also started megace 400 mg poqday as an appetite stimulant and want to recheck her weight in 1 month.

## 2017-05-19 DIAGNOSIS — G894 Chronic pain syndrome: Secondary | ICD-10-CM | POA: Diagnosis not present

## 2017-05-19 DIAGNOSIS — M47817 Spondylosis without myelopathy or radiculopathy, lumbosacral region: Secondary | ICD-10-CM | POA: Diagnosis not present

## 2017-05-19 DIAGNOSIS — M62830 Muscle spasm of back: Secondary | ICD-10-CM | POA: Diagnosis not present

## 2017-05-19 DIAGNOSIS — M47812 Spondylosis without myelopathy or radiculopathy, cervical region: Secondary | ICD-10-CM | POA: Diagnosis not present

## 2017-05-21 ENCOUNTER — Other Ambulatory Visit: Payer: Self-pay | Admitting: Family Medicine

## 2017-05-25 NOTE — Telephone Encounter (Signed)
Ok to refill 

## 2017-05-25 NOTE — Telephone Encounter (Signed)
ok 

## 2017-05-26 ENCOUNTER — Telehealth: Payer: Self-pay | Admitting: Family Medicine

## 2017-05-26 NOTE — Telephone Encounter (Signed)
Please call patient regarding a new med that seems to be speeding up her heart  Please call her at [phone]

## 2017-05-26 NOTE — Telephone Encounter (Signed)
RX called in .

## 2017-05-27 NOTE — Telephone Encounter (Signed)
Hold med and see if racing pulse stops, if not ntbs or ntbs to make sure nothing else is causing this.  Megace can cause palpitations in 1-3%, rarely PE so ntbs if sob.

## 2017-05-27 NOTE — Telephone Encounter (Signed)
Pt states since starting the Megace her heart has just been racing.  Says it works good for her appetite but doesn't want to have a heart attack.  Please advise??

## 2017-05-27 NOTE — Telephone Encounter (Signed)
spoke with patient she is aware of provider recommendations

## 2017-05-29 ENCOUNTER — Telehealth: Payer: Self-pay | Admitting: Family Medicine

## 2017-05-29 NOTE — Telephone Encounter (Signed)
Patient calling to say her heart is still racing some, and another doctor has put her on cymbalta, could this be causing her heart to race [phone]

## 2017-05-29 NOTE — Telephone Encounter (Signed)
I don't think so, I really want to see her for ekg, tsh, cbc, bmp

## 2017-05-29 NOTE — Telephone Encounter (Signed)
Pt aware and spt scheduled

## 2017-06-01 ENCOUNTER — Ambulatory Visit (INDEPENDENT_AMBULATORY_CARE_PROVIDER_SITE_OTHER): Payer: PPO | Admitting: Family Medicine

## 2017-06-01 ENCOUNTER — Encounter: Payer: Self-pay | Admitting: Family Medicine

## 2017-06-01 VITALS — BP 100/58 | HR 62 | Temp 98.2°F | Resp 16 | Ht 66.0 in | Wt 115.0 lb

## 2017-06-01 DIAGNOSIS — R9431 Abnormal electrocardiogram [ECG] [EKG]: Secondary | ICD-10-CM | POA: Diagnosis not present

## 2017-06-01 DIAGNOSIS — R000 Tachycardia, unspecified: Secondary | ICD-10-CM | POA: Diagnosis not present

## 2017-06-01 DIAGNOSIS — I456 Pre-excitation syndrome: Secondary | ICD-10-CM

## 2017-06-01 LAB — COMPLETE METABOLIC PANEL WITH GFR
AG RATIO: 1.8 (calc) (ref 1.0–2.5)
ALBUMIN MSPROF: 4.2 g/dL (ref 3.6–5.1)
ALT: 18 U/L (ref 6–29)
AST: 17 U/L (ref 10–35)
Alkaline phosphatase (APISO): 33 U/L (ref 33–130)
BILIRUBIN TOTAL: 0.5 mg/dL (ref 0.2–1.2)
BUN: 18 mg/dL (ref 7–25)
CHLORIDE: 104 mmol/L (ref 98–110)
CO2: 30 mmol/L (ref 20–32)
Calcium: 9.6 mg/dL (ref 8.6–10.4)
Creat: 0.97 mg/dL (ref 0.50–0.99)
GFR, EST AFRICAN AMERICAN: 73 mL/min/{1.73_m2} (ref >=60)
GFR, Est Non African American: 63 mL/min/{1.73_m2} (ref >=60)
GLUCOSE: 89 mg/dL (ref 65–99)
Globulin: 2.4 g/dL (calc) (ref 1.9–3.7)
POTASSIUM: 4.2 mmol/L (ref 3.5–5.3)
Sodium: 140 mmol/L (ref 135–146)
TOTAL PROTEIN: 6.6 g/dL (ref 6.1–8.1)

## 2017-06-01 LAB — CBC WITH DIFFERENTIAL/PLATELET
BASOS ABS: 20 {cells}/uL (ref 0–200)
Basophils Relative: 0.4 %
EOS PCT: 1 %
Eosinophils Absolute: 51 cells/uL (ref 15–500)
HEMATOCRIT: 37.5 % (ref 35.0–45.0)
Hemoglobin: 12.3 g/dL (ref 11.7–15.5)
Lymphs Abs: 1668 cells/uL (ref 850–3900)
MCH: 30 pg (ref 27.0–33.0)
MCHC: 32.8 g/dL (ref 32.0–36.0)
MCV: 91.5 fL (ref 80.0–100.0)
MONOS PCT: 8.9 %
MPV: 11.3 fL (ref 7.5–12.5)
NEUTROS PCT: 57 %
Neutro Abs: 2907 cells/uL (ref 1500–7800)
PLATELETS: 168 10*3/uL (ref 140–400)
RBC: 4.1 10*6/uL (ref 3.80–5.10)
RDW: 12.3 % (ref 11.0–15.0)
TOTAL LYMPHOCYTE: 32.7 %
WBC mixed population: 454 cells/uL (ref 200–950)
WBC: 5.1 10*3/uL (ref 3.8–10.8)

## 2017-06-01 LAB — TSH: TSH: 0.94 mIU/L (ref 0.40–4.50)

## 2017-06-01 NOTE — Progress Notes (Signed)
Subjective:    Patient ID: Julie Neal, female    DOB: 04-25-1956, 61 y.o.   MRN: [id number]  HPI Patient called last week complaining of tachycardia ever since I started her on Megace.  I asked the patient to discontinue the Megace and come in immediately to be seen.  However she did gain 4 pounds while taking the Megace just over a period of 3 weeks.   Wt Readings from Last 3 Encounters:  06/01/17 115 lb (52.2 kg)  05/15/17 111 lb (50.3 kg)  03/26/17 114 lb (51.7 kg)    Patient felt better on the medication.  However she states on the third day, she developed sudden onset of tachycardia that lasted hours.  She did not count her heart rate but it felt like it was going to beat out of her chest.  She denies shortness of breath.  She denies any pleurisy.  She denies any unilateral leg swelling.  She denies any orthopnea.  She denies any dyspnea on exertion.  EKG today shows normal sinus rhythm with normal axis with no evidence of ischemia or infarction however PR interval is extremely small and does raise the concern for a delta wave and possible Wolff-Parkinson-White.  Also paroxysmal atrial fibrillation is on the differential diagnosis.  I believe a DVT/PE is extremely unlikely given her lack of symptoms even though the tachycardia began when she started hormones to gain weight.  The tachycardia has not persisted since she discontinued the hormone which would be unlike a pulmonary embolism Past Medical History:  Diagnosis Date  . Anxiety   . Asymptomatic stenosis of right carotid artery    1-30%  . Cervicalgia   . Depression   . Fibromyalgia   . Fracture of ankle, bimalleolar, left, closed 08/10/2014  . Helicobacter pylori gastritis 10/2016  . Hyperlipidemia   . Lumbago   . Memory loss   . Migraines   . Neuralgic migraines   . Osteoporosis    Past Surgical History:  Procedure Laterality Date  . ABDOMINAL HYSTERECTOMY    . BACK SURGERY    . HAND SURGERY Bilateral   . LEG  SURGERY Bilateral   . NECK SURGERY    . SHOULDER SURGERY Left    Current Outpatient Prescriptions on File Prior to Visit  Medication Sig Dispense Refill  . alendronate (FOSAMAX) 70 MG tablet TAKE 1 TABLET BY MOUTH EVERY 7 DAYS WITH FULL GLASS OF WATER ON EMPTY STOMACH 12 tablet 4  . atorvastatin (LIPITOR) 20 MG tablet TAKE 1 TABLET (20 MG TOTAL) BY MOUTH DAILY. 90 tablet 1  . buPROPion (WELLBUTRIN XL) 300 MG 24 hr tablet Take 1 tablet (300 mg total) by mouth daily. 90 tablet 3  . Clobetasol Prop Emollient Base (CLOBETASOL PROPIONATE E) 0.05 % emollient cream Apply 1 application topically 2 (two) times daily. 30 g 0  . diazepam (VALIUM) 5 MG tablet TAKE 1 TABLET BY MOUTH TWICE A DAY AS NEEDED FOR ANXIETY 180 tablet 0  . megestrol (MEGACE) 400 MG/10ML suspension Take 10 mLs (400 mg total) by mouth daily. 300 mL 0  . morphine (MSIR) 15 MG tablet Take 15 mg by mouth 4 (four) times daily.    Marland Kitchen tiZANidine (ZANAFLEX) 4 MG tablet TAKE 1 TABLET BY MOUTH 4 TIMES A DAY AS NEEDED  0  . traZODone (DESYREL) 100 MG tablet TAKE 2 TABLETS BY MOUTH AT BEDTIME 180 tablet 1   Current Facility-Administered Medications on File Prior to Visit  Medication Dose Route  Frequency Provider Last Rate Last Dose  . 0.9 %  sodium chloride infusion  500 mL Intravenous Continuous Gatha Mayer, MD       Allergies  Allergen Reactions  . Mobic [Meloxicam] Anaphylaxis  . Penicillins Anaphylaxis  . Nubain [Nalbuphine Hcl] Diarrhea and Nausea And Vomiting   Social History   Social History  . Marital status: Married    Spouse name: [name]  . Number of children: 1  . Years of education: college   Occupational History  .      disabled   Social History Main Topics  . Smoking status: Former Smoker    Types: Cigarettes  . Smokeless tobacco: Never Used     Comment: Quit in 1988  . Alcohol use No  . Drug use: No  . Sexual activity: Not on file   Other Topics Concern  . Not on file   Social History Narrative    Patient lives at home with her husband [name])  and has a college education. Patient has one child.   Patient is disabled. Right handed.   Caffeine one cup of coffee.      Review of Systems  All other systems reviewed and are negative.      Objective:   Physical Exam  Constitutional: She appears well-developed and well-nourished. No distress.  Neck: Neck supple. No thyromegaly present.  Cardiovascular: Normal rate, regular rhythm and normal heart sounds.   No murmur heard. Pulmonary/Chest: Effort normal and breath sounds normal. No respiratory distress. She has no wheezes. She has no rales.  Abdominal: Soft. Bowel sounds are normal. She exhibits no distension. There is no tenderness. There is no rebound and no guarding.  Skin: She is not diaphoretic.  Vitals reviewed.         Assessment & Plan:  Racing heart beat - Plan: EKG 12-Lead, CBC with Differential/Platelet, COMPLETE METABOLIC PANEL WITH GFR, TSH, D-dimer, quantitative (not at [facility]), Ambulatory referral to Cardiology  Abnormal EKG - Plan: Ambulatory referral to Cardiology  Wolff-Parkinson-White (WPW) pattern seen on electrocardiography - Plan: Ambulatory referral to Cardiology  I can find no case reports of tachycardia on Megace in the literature.  Therefore I do not believe the hormone played a role in this.  EKG is concerning for possible Wolff-Parkinson-White syndrome.  Therefore I will consult cardiology.  I also believe the patient would benefit from an event monitor to evaluate for paroxysmal atrial fibrillation.  I will also check a CBC to evaluate for anemia, a d-dimer to look for PE although I believe this is unlikely, a TSH to evaluate for hyperthyroidism, and a CMP to evaluate for any electrolyte abnormal.  Discontinue the medication until workup is complete

## 2017-06-02 ENCOUNTER — Ambulatory Visit: Payer: PPO | Admitting: Family Medicine

## 2017-06-02 LAB — D-DIMER, QUANTITATIVE (NOT AT ARMC): D DIMER QUANT: 0.41 ug{FEU}/mL (ref <0.50)

## 2017-06-08 ENCOUNTER — Encounter (INDEPENDENT_AMBULATORY_CARE_PROVIDER_SITE_OTHER): Payer: Self-pay | Admitting: Orthopedic Surgery

## 2017-06-08 ENCOUNTER — Ambulatory Visit (INDEPENDENT_AMBULATORY_CARE_PROVIDER_SITE_OTHER): Payer: PPO | Admitting: Orthopedic Surgery

## 2017-06-08 DIAGNOSIS — M79645 Pain in left finger(s): Secondary | ICD-10-CM | POA: Diagnosis not present

## 2017-06-08 NOTE — Progress Notes (Signed)
Office Visit Note   Patient: Julie Neal           Date of Birth: 1956/06/08           MRN: [id number] Visit Date: 06/08/2017              Requested by: Susy Frizzle, MD [address] [place], [place] [address] PCP: Susy Frizzle, MD  Chief Complaint  Patient presents with  . Left Ring Finger - Pain, Injury      HPI:  Patient is a 61 year old woman who states that a month ago she slipped while cutting a potato with her knife and sustained a longitudinal laceration over the PIP joint of the left ring finger.  Patient complains of ecchymosis and bruising.  Review of systems positive for anxiety arthritis depression migraines osteoporosis as well as fibromyalgia and degenerative disc disease.  Assessment & Plan: Visit Diagnoses:  1. Finger pain, left     Plan: Increase activities as tolerated no restrictions.  Follow-Up Instructions: Return if symptoms worsen or fail to improve.   Ortho Exam  Patient is alert, oriented, no adenopathy, well-dressed, normal affect, normal respiratory effort. Examination patient has full active extension of all of her fingers.  She has intact FDS and FDP function to all fingers.  There is no subluxation of the extensor tendons and she has good varus and valgus extension strength.  The PIP joints are nontender to palpation there is no redness no swelling no ulcers or wounds no signs of infection.  Imaging: No results found. No images are attached to the encounter.  Labs: Lab Results  Component Value Date   HGBA1C CANCELED 06/30/2016   HGBA1C 5.6 06/30/2007   ESRSEDRATE 4 10/20/2016    Orders:  No orders of the defined types were placed in this encounter.  No orders of the defined types were placed in this encounter.    Procedures: No procedures performed  Clinical Data: No additional findings.  ROS:  All other systems negative, except as noted in the HPI. Review of Systems  Objective: Vital Signs: There  were no vitals taken for this visit.  Specialty Comments:  No specialty comments available.  PMFS History: Patient Active Problem List   Diagnosis Date Noted  . Finger pain, left 06/08/2017  . Helicobacter pylori gastritis   . Asymptomatic stenosis of right carotid artery   . Fracture of ankle, bimalleolar, left, closed 08/10/2014  . Shaking 01/12/2013  . Anxiety   . Memory loss   . Lumbago   . Fibromyalgia   . Migraines   . Hyperlipidemia   . Depression   . Neuralgic migraines   . PULMONARY NODULE 07/25/2010  . RECTAL BLEEDING 04/22/2010  . NAUSEA AND VOMITING 04/22/2010  . CHANGE IN BOWELS 04/22/2010  . RLQ PAIN 04/22/2010  . MIGRAINES, HX OF 01/05/2007   Past Medical History:  Diagnosis Date  . Anxiety   . Asymptomatic stenosis of right carotid artery    1-30%  . Cervicalgia   . Depression   . Fibromyalgia   . Fracture of ankle, bimalleolar, left, closed 08/10/2014  . Helicobacter pylori gastritis 10/2016  . Hyperlipidemia   . Lumbago   . Memory loss   . Migraines   . Neuralgic migraines   . Osteoporosis     Family History  Problem Relation Age of Onset  . Heart attack Father   . Heart disease Father 13  . Alzheimer's disease Mother   .  Stroke Mother   . Diabetes type II Brother   . Breast cancer Sister     Past Surgical History:  Procedure Laterality Date  . ABDOMINAL HYSTERECTOMY    . BACK SURGERY    . HAND SURGERY Bilateral   . LEG SURGERY Bilateral   . NECK SURGERY    . SHOULDER SURGERY Left    Social History   Occupational History  .      disabled   Social History Main Topics  . Smoking status: Former Smoker    Types: Cigarettes  . Smokeless tobacco: Never Used     Comment: Quit in 1988  . Alcohol use No  . Drug use: No  . Sexual activity: Not on file

## 2017-06-12 ENCOUNTER — Other Ambulatory Visit: Payer: Self-pay | Admitting: Family Medicine

## 2017-06-17 DIAGNOSIS — G894 Chronic pain syndrome: Secondary | ICD-10-CM | POA: Diagnosis not present

## 2017-06-17 DIAGNOSIS — M47817 Spondylosis without myelopathy or radiculopathy, lumbosacral region: Secondary | ICD-10-CM | POA: Diagnosis not present

## 2017-06-17 DIAGNOSIS — M47812 Spondylosis without myelopathy or radiculopathy, cervical region: Secondary | ICD-10-CM | POA: Diagnosis not present

## 2017-06-17 DIAGNOSIS — M62830 Muscle spasm of back: Secondary | ICD-10-CM | POA: Diagnosis not present

## 2017-06-30 ENCOUNTER — Ambulatory Visit: Payer: Self-pay | Admitting: Cardiology

## 2017-06-30 NOTE — Progress Notes (Deleted)
Electrophysiology Office Note   Date:  06/30/2017   ID:  Julie Neal, DOB 1955/08/15, MRN [id number]  PCP:  Susy Frizzle, MD  Cardiologist:   Primary Electrophysiologist:  Will Meredith Leeds, MD    No chief complaint on file.    History of Present Illness: Julie Neal is a 61 y.o. female who is being seen today for the evaluation of palpitations at the request of Susy Frizzle, MD. Presenting today for electrophysiology evaluation.  She has a history of carotid stenosis and hyperlipidemia.  She presents today for evaluation of palpitations.  She develops tachycardia that lasted for hours.  She could not count her heart rate but felt like it was going to beat out of her chest.  No shortness of breath.    Today, she denies*** symptoms of palpitations, chest pain, shortness of breath, orthopnea, PND, lower extremity edema, claudication, dizziness, presyncope, syncope, bleeding, or neurologic sequela. The patient is tolerating medications without difficulties.    Past Medical History:  Diagnosis Date  . Anxiety   . Asymptomatic stenosis of right carotid artery    1-30%  . Cervicalgia   . Depression   . Fibromyalgia   . Fracture of ankle, bimalleolar, left, closed 08/10/2014  . Helicobacter pylori gastritis 10/2016  . Hyperlipidemia   . Lumbago   . Memory loss   . Migraines   . Neuralgic migraines   . Osteoporosis    Past Surgical History:  Procedure Laterality Date  . ABDOMINAL HYSTERECTOMY    . BACK SURGERY    . HAND SURGERY Bilateral   . LEG SURGERY Bilateral   . NECK SURGERY    . SHOULDER SURGERY Left      Current Outpatient Medications  Medication Sig Dispense Refill  . alendronate (FOSAMAX) 70 MG tablet TAKE 1 TABLET BY MOUTH EVERY 7 DAYS WITH FULL GLASS OF WATER ON EMPTY STOMACH 12 tablet 4  . atorvastatin (LIPITOR) 20 MG tablet TAKE 1 TABLET (20 MG TOTAL) BY MOUTH DAILY. 90 tablet 1  . buPROPion (WELLBUTRIN XL) 300 MG 24 hr tablet Take 1  tablet (300 mg total) by mouth daily. 90 tablet 3  . Clobetasol Prop Emollient Base (CLOBETASOL PROPIONATE E) 0.05 % emollient cream Apply 1 application topically 2 (two) times daily. 30 g 0  . diazepam (VALIUM) 5 MG tablet TAKE 1 TABLET BY MOUTH TWICE A DAY AS NEEDED FOR ANXIETY 180 tablet 0  . DULoxetine (CYMBALTA) 30 MG capsule TAKE 1 CAPSULE BY MOUTH EVERYDAY AT BEDTIME  1  . megestrol (MEGACE) 40 MG/ML suspension TAKE 10 MLS BY MOUTH EVERY DAY 300 mL 0  . morphine (MSIR) 15 MG tablet Take 15 mg by mouth 4 (four) times daily.    Marland Kitchen tiZANidine (ZANAFLEX) 4 MG tablet TAKE 1 TABLET BY MOUTH 4 TIMES A DAY AS NEEDED  0  . traZODone (DESYREL) 100 MG tablet TAKE 2 TABLETS BY MOUTH AT BEDTIME 180 tablet 1   Current Facility-Administered Medications  Medication Dose Route Frequency Provider Last Rate Last Dose  . 0.9 %  sodium chloride infusion  500 mL Intravenous Continuous Gatha Mayer, MD        Allergies:   Mobic [meloxicam]; Penicillins; and Nubain [nalbuphine hcl]   Social History:  The patient  reports that she has quit smoking. Her smoking use included cigarettes. she has never used smokeless tobacco. She reports that she does not drink alcohol or use drugs.   Family History:  The patient's family  history includes Alzheimer's disease in her mother; Breast cancer in her sister; Diabetes type II in her brother; Heart attack in her father; Heart disease (age of onset: 44) in her father; Stroke in her mother.    ROS:  Please see the history of present illness.   Otherwise, review of systems is positive for ***.   All other systems are reviewed and negative.    PHYSICAL EXAM: VS:  There were no vitals taken for this visit. , BMI There is no height or weight on file to calculate BMI. GEN: Well nourished, well developed, in no acute distress  HEENT: normal  Neck: no JVD, carotid bruits, or masses Cardiac: ***RRR; no murmurs, rubs, or gallops,no edema  Respiratory:  clear to auscultation  bilaterally, normal work of breathing GI: soft, nontender, nondistended, + BS MS: no deformity or atrophy  Skin: warm and dry Neuro:  Strength and sensation are intact Psych: euthymic mood, full affect  EKG:  EKG {ACTION; IS/IS YYT:[id number]} ordered today. Personal review of the ekg ordered shows ***  Recent Labs: 06/01/2017: ALT 18; BUN 18; Creat 0.97; Hemoglobin 12.3; Platelets 168; Potassium 4.2; Sodium 140; TSH 0.94    Lipid Panel     Component Value Date/Time   CHOL 113 06/30/2016 1042   TRIG 61 06/30/2016 1042   HDL 38 (L) 06/30/2016 1042   CHOLHDL 3.0 06/30/2016 1042   VLDL 12 06/30/2016 1042   LDLCALC 63 06/30/2016 1042     Wt Readings from Last 3 Encounters:  06/01/17 115 lb (52.2 kg)  05/15/17 111 lb (50.3 kg)  03/26/17 114 lb (51.7 kg)      Other studies Reviewed: Additional studies/ records that were reviewed today include: Epic notes   ASSESSMENT AND PLAN:  1.  Palpitations: Patient does have a short PR interval of 114 ms on her EKG.  That being said, I do not see any evidence of a delta wave.  We will plan for a monitor to further determine the cause of her palpitations.    Current medicines are reviewed at length with the patient today.   The patient {ACTIONS; HAS/DOES NOT HAVE:19233} concerns regarding her medicines.  The following changes were made today:  {NONE DEFAULTED:18576::"none"}  Labs/ tests ordered today include: *** No orders of the defined types were placed in this encounter.    Disposition:   FU with Will Camnitz {gen number 1-27:517001} {Days to years:10300}  Signed, Will Meredith Leeds, MD  06/30/2017 8:36 AM     [facility] [address] [place]  [phone] (office) [phone] (fax)

## 2017-07-01 ENCOUNTER — Encounter: Payer: Self-pay | Admitting: Cardiology

## 2017-07-01 ENCOUNTER — Other Ambulatory Visit: Payer: Self-pay | Admitting: Family Medicine

## 2017-07-01 DIAGNOSIS — F330 Major depressive disorder, recurrent, mild: Secondary | ICD-10-CM

## 2017-07-01 NOTE — Telephone Encounter (Signed)
Medication refilled per protocol. 

## 2017-07-15 DIAGNOSIS — M47817 Spondylosis without myelopathy or radiculopathy, lumbosacral region: Secondary | ICD-10-CM | POA: Diagnosis not present

## 2017-07-15 DIAGNOSIS — M62830 Muscle spasm of back: Secondary | ICD-10-CM | POA: Diagnosis not present

## 2017-07-15 DIAGNOSIS — M47812 Spondylosis without myelopathy or radiculopathy, cervical region: Secondary | ICD-10-CM | POA: Diagnosis not present

## 2017-07-15 DIAGNOSIS — G894 Chronic pain syndrome: Secondary | ICD-10-CM | POA: Diagnosis not present

## 2017-08-13 ENCOUNTER — Other Ambulatory Visit: Payer: Self-pay | Admitting: Family Medicine

## 2017-08-14 DIAGNOSIS — G894 Chronic pain syndrome: Secondary | ICD-10-CM | POA: Diagnosis not present

## 2017-08-14 DIAGNOSIS — M47817 Spondylosis without myelopathy or radiculopathy, lumbosacral region: Secondary | ICD-10-CM | POA: Diagnosis not present

## 2017-08-14 DIAGNOSIS — M47812 Spondylosis without myelopathy or radiculopathy, cervical region: Secondary | ICD-10-CM | POA: Diagnosis not present

## 2017-08-14 DIAGNOSIS — M62830 Muscle spasm of back: Secondary | ICD-10-CM | POA: Diagnosis not present

## 2017-08-22 ENCOUNTER — Other Ambulatory Visit: Payer: Self-pay | Admitting: Physical Medicine and Rehabilitation

## 2017-08-22 DIAGNOSIS — M5412 Radiculopathy, cervical region: Secondary | ICD-10-CM

## 2017-08-24 ENCOUNTER — Other Ambulatory Visit: Payer: Self-pay | Admitting: Family Medicine

## 2017-08-24 NOTE — Telephone Encounter (Signed)
Requesting refill Valium      LOV: 06/01/17  LRF:  05/26/17

## 2017-08-28 DIAGNOSIS — H01021 Squamous blepharitis right upper eyelid: Secondary | ICD-10-CM | POA: Diagnosis not present

## 2017-08-28 DIAGNOSIS — H2513 Age-related nuclear cataract, bilateral: Secondary | ICD-10-CM | POA: Diagnosis not present

## 2017-08-28 DIAGNOSIS — H01022 Squamous blepharitis right lower eyelid: Secondary | ICD-10-CM | POA: Diagnosis not present

## 2017-08-28 DIAGNOSIS — H17821 Peripheral opacity of cornea, right eye: Secondary | ICD-10-CM | POA: Diagnosis not present

## 2017-08-28 DIAGNOSIS — L719 Rosacea, unspecified: Secondary | ICD-10-CM | POA: Diagnosis not present

## 2017-08-28 DIAGNOSIS — H01025 Squamous blepharitis left lower eyelid: Secondary | ICD-10-CM | POA: Diagnosis not present

## 2017-08-28 DIAGNOSIS — H04123 Dry eye syndrome of bilateral lacrimal glands: Secondary | ICD-10-CM | POA: Diagnosis not present

## 2017-08-28 DIAGNOSIS — L718 Other rosacea: Secondary | ICD-10-CM | POA: Diagnosis not present

## 2017-08-28 DIAGNOSIS — H01024 Squamous blepharitis left upper eyelid: Secondary | ICD-10-CM | POA: Diagnosis not present

## 2017-08-28 DIAGNOSIS — H10413 Chronic giant papillary conjunctivitis, bilateral: Secondary | ICD-10-CM | POA: Diagnosis not present

## 2017-09-02 DIAGNOSIS — M7552 Bursitis of left shoulder: Secondary | ICD-10-CM | POA: Diagnosis not present

## 2017-09-11 DIAGNOSIS — Z79891 Long term (current) use of opiate analgesic: Secondary | ICD-10-CM | POA: Diagnosis not present

## 2017-09-11 DIAGNOSIS — M47812 Spondylosis without myelopathy or radiculopathy, cervical region: Secondary | ICD-10-CM | POA: Diagnosis not present

## 2017-09-11 DIAGNOSIS — G894 Chronic pain syndrome: Secondary | ICD-10-CM | POA: Diagnosis not present

## 2017-09-11 DIAGNOSIS — M62830 Muscle spasm of back: Secondary | ICD-10-CM | POA: Diagnosis not present

## 2017-09-11 DIAGNOSIS — M47817 Spondylosis without myelopathy or radiculopathy, lumbosacral region: Secondary | ICD-10-CM | POA: Diagnosis not present

## 2017-09-18 ENCOUNTER — Other Ambulatory Visit: Payer: Self-pay | Admitting: Physical Medicine and Rehabilitation

## 2017-09-18 DIAGNOSIS — M5412 Radiculopathy, cervical region: Secondary | ICD-10-CM

## 2017-09-21 DIAGNOSIS — G894 Chronic pain syndrome: Secondary | ICD-10-CM | POA: Diagnosis not present

## 2017-09-21 DIAGNOSIS — Z79891 Long term (current) use of opiate analgesic: Secondary | ICD-10-CM | POA: Diagnosis not present

## 2017-10-05 ENCOUNTER — Other Ambulatory Visit: Payer: Self-pay | Admitting: *Deleted

## 2017-10-05 DIAGNOSIS — F330 Major depressive disorder, recurrent, mild: Secondary | ICD-10-CM

## 2017-10-05 MED ORDER — BUPROPION HCL ER (XL) 300 MG PO TB24: 300.0000 mg | ORAL_TABLET | Freq: Every day | ORAL | 1 refills | Status: DC

## 2017-10-05 MED ORDER — TRAZODONE HCL 100 MG PO TABS: 200.0000 mg | ORAL_TABLET | Freq: Every day | ORAL | 1 refills | Status: DC

## 2017-10-09 DIAGNOSIS — G894 Chronic pain syndrome: Secondary | ICD-10-CM | POA: Diagnosis not present

## 2017-10-09 DIAGNOSIS — M47812 Spondylosis without myelopathy or radiculopathy, cervical region: Secondary | ICD-10-CM | POA: Diagnosis not present

## 2017-10-09 DIAGNOSIS — M62830 Muscle spasm of back: Secondary | ICD-10-CM | POA: Diagnosis not present

## 2017-10-09 DIAGNOSIS — M47817 Spondylosis without myelopathy or radiculopathy, lumbosacral region: Secondary | ICD-10-CM | POA: Diagnosis not present

## 2017-10-12 ENCOUNTER — Ambulatory Visit
Admission: RE | Admit: 2017-10-12 | Discharge: 2017-10-12 | Disposition: A | Discharge status: Home / Self Care | Payer: PPO | Source: Ambulatory Visit | Attending: Physical Medicine and Rehabilitation | Admitting: Physical Medicine and Rehabilitation

## 2017-10-12 DIAGNOSIS — M50323 Other cervical disc degeneration at C6-C7 level: Secondary | ICD-10-CM | POA: Diagnosis not present

## 2017-10-12 DIAGNOSIS — M5412 Radiculopathy, cervical region: Secondary | ICD-10-CM

## 2017-10-27 ENCOUNTER — Other Ambulatory Visit: Payer: Self-pay | Admitting: Family Medicine

## 2017-10-27 DIAGNOSIS — Z1231 Encounter for screening mammogram for malignant neoplasm of breast: Secondary | ICD-10-CM

## 2017-11-06 DIAGNOSIS — M47812 Spondylosis without myelopathy or radiculopathy, cervical region: Secondary | ICD-10-CM | POA: Diagnosis not present

## 2017-11-06 DIAGNOSIS — G894 Chronic pain syndrome: Secondary | ICD-10-CM | POA: Diagnosis not present

## 2017-11-06 DIAGNOSIS — M62830 Muscle spasm of back: Secondary | ICD-10-CM | POA: Diagnosis not present

## 2017-11-06 DIAGNOSIS — M47817 Spondylosis without myelopathy or radiculopathy, lumbosacral region: Secondary | ICD-10-CM | POA: Diagnosis not present

## 2017-11-14 ENCOUNTER — Other Ambulatory Visit: Payer: Self-pay | Admitting: Family Medicine

## 2017-11-16 NOTE — Telephone Encounter (Signed)
Requesting refill    Valium LOV: 06/01/17  LRF:  08/25/17

## 2017-11-18 ENCOUNTER — Ambulatory Visit
Admission: RE | Admit: 2017-11-18 | Discharge: 2017-11-18 | Disposition: A | Discharge status: Home / Self Care | Payer: PPO | Source: Ambulatory Visit | Attending: Family Medicine | Admitting: Family Medicine

## 2017-11-18 DIAGNOSIS — Z1231 Encounter for screening mammogram for malignant neoplasm of breast: Secondary | ICD-10-CM | POA: Diagnosis not present

## 2017-11-20 ENCOUNTER — Telehealth: Payer: Self-pay | Admitting: Family Medicine

## 2017-11-20 NOTE — Telephone Encounter (Signed)
Pharmacy called and states that pt will be out of her Valium Today and would like to get it refilled early as she states it was some of the tablets crushed in the bottom. Per Dr. Dennard Schaumann ok to refill early this one time but it must last 90 days and no further early refills. Pharm called and made aware.

## 2017-11-26 ENCOUNTER — Other Ambulatory Visit: Payer: Self-pay | Admitting: Family Medicine

## 2017-12-07 DIAGNOSIS — G5601 Carpal tunnel syndrome, right upper limb: Secondary | ICD-10-CM | POA: Diagnosis not present

## 2017-12-07 DIAGNOSIS — M5412 Radiculopathy, cervical region: Secondary | ICD-10-CM | POA: Diagnosis not present

## 2018-01-05 DIAGNOSIS — G894 Chronic pain syndrome: Secondary | ICD-10-CM | POA: Diagnosis not present

## 2018-01-05 DIAGNOSIS — M62830 Muscle spasm of back: Secondary | ICD-10-CM | POA: Diagnosis not present

## 2018-01-05 DIAGNOSIS — M47812 Spondylosis without myelopathy or radiculopathy, cervical region: Secondary | ICD-10-CM | POA: Diagnosis not present

## 2018-01-05 DIAGNOSIS — M47817 Spondylosis without myelopathy or radiculopathy, lumbosacral region: Secondary | ICD-10-CM | POA: Diagnosis not present

## 2018-01-20 ENCOUNTER — Ambulatory Visit
Admission: RE | Admit: 2018-01-20 | Discharge: 2018-01-20 | Disposition: A | Discharge status: Home / Self Care | Payer: PPO | Source: Ambulatory Visit | Attending: Physical Medicine and Rehabilitation | Admitting: Physical Medicine and Rehabilitation

## 2018-01-20 ENCOUNTER — Other Ambulatory Visit: Payer: Self-pay | Admitting: Physical Medicine and Rehabilitation

## 2018-01-20 DIAGNOSIS — M25551 Pain in right hip: Secondary | ICD-10-CM

## 2018-02-10 ENCOUNTER — Other Ambulatory Visit: Payer: Self-pay | Admitting: Family Medicine

## 2018-02-14 ENCOUNTER — Other Ambulatory Visit: Payer: Self-pay | Admitting: Family Medicine

## 2018-02-15 NOTE — Telephone Encounter (Signed)
Ok to refill??  Last office visit 06/01/2017.  Last refill 11/16/2017.

## 2018-03-02 DIAGNOSIS — M47812 Spondylosis without myelopathy or radiculopathy, cervical region: Secondary | ICD-10-CM | POA: Diagnosis not present

## 2018-03-02 DIAGNOSIS — G894 Chronic pain syndrome: Secondary | ICD-10-CM | POA: Diagnosis not present

## 2018-03-02 DIAGNOSIS — M62830 Muscle spasm of back: Secondary | ICD-10-CM | POA: Diagnosis not present

## 2018-03-02 DIAGNOSIS — M47817 Spondylosis without myelopathy or radiculopathy, lumbosacral region: Secondary | ICD-10-CM | POA: Diagnosis not present

## 2018-03-31 DIAGNOSIS — M7060 Trochanteric bursitis, unspecified hip: Secondary | ICD-10-CM | POA: Diagnosis not present

## 2018-03-31 DIAGNOSIS — G894 Chronic pain syndrome: Secondary | ICD-10-CM | POA: Diagnosis not present

## 2018-03-31 DIAGNOSIS — M47817 Spondylosis without myelopathy or radiculopathy, lumbosacral region: Secondary | ICD-10-CM | POA: Diagnosis not present

## 2018-03-31 DIAGNOSIS — M47812 Spondylosis without myelopathy or radiculopathy, cervical region: Secondary | ICD-10-CM | POA: Diagnosis not present

## 2018-03-31 DIAGNOSIS — M62830 Muscle spasm of back: Secondary | ICD-10-CM | POA: Diagnosis not present

## 2018-04-28 DIAGNOSIS — G894 Chronic pain syndrome: Secondary | ICD-10-CM | POA: Diagnosis not present

## 2018-04-28 DIAGNOSIS — M62830 Muscle spasm of back: Secondary | ICD-10-CM | POA: Diagnosis not present

## 2018-04-28 DIAGNOSIS — M47812 Spondylosis without myelopathy or radiculopathy, cervical region: Secondary | ICD-10-CM | POA: Diagnosis not present

## 2018-04-28 DIAGNOSIS — M47817 Spondylosis without myelopathy or radiculopathy, lumbosacral region: Secondary | ICD-10-CM | POA: Diagnosis not present

## 2018-05-23 ENCOUNTER — Other Ambulatory Visit: Payer: Self-pay | Admitting: Family Medicine

## 2018-05-24 NOTE — Telephone Encounter (Signed)
Ok to refill??  Last office visit 05/12/2017.  Last refill 02/15/2018.

## 2018-05-27 DIAGNOSIS — G894 Chronic pain syndrome: Secondary | ICD-10-CM | POA: Diagnosis not present

## 2018-05-27 DIAGNOSIS — M47812 Spondylosis without myelopathy or radiculopathy, cervical region: Secondary | ICD-10-CM | POA: Diagnosis not present

## 2018-05-27 DIAGNOSIS — M47817 Spondylosis without myelopathy or radiculopathy, lumbosacral region: Secondary | ICD-10-CM | POA: Diagnosis not present

## 2018-05-27 DIAGNOSIS — M62830 Muscle spasm of back: Secondary | ICD-10-CM | POA: Diagnosis not present

## 2018-05-31 ENCOUNTER — Ambulatory Visit (INDEPENDENT_AMBULATORY_CARE_PROVIDER_SITE_OTHER): Payer: PPO | Admitting: Family Medicine

## 2018-05-31 DIAGNOSIS — I456 Pre-excitation syndrome: Secondary | ICD-10-CM

## 2018-05-31 DIAGNOSIS — M797 Fibromyalgia: Secondary | ICD-10-CM

## 2018-05-31 DIAGNOSIS — I6521 Occlusion and stenosis of right carotid artery: Secondary | ICD-10-CM | POA: Diagnosis not present

## 2018-05-31 DIAGNOSIS — Z23 Encounter for immunization: Secondary | ICD-10-CM | POA: Diagnosis not present

## 2018-06-04 ENCOUNTER — Other Ambulatory Visit: Payer: Self-pay | Admitting: Family Medicine

## 2018-06-04 DIAGNOSIS — F330 Major depressive disorder, recurrent, mild: Secondary | ICD-10-CM

## 2018-06-07 ENCOUNTER — Other Ambulatory Visit: Payer: Self-pay | Admitting: Family Medicine

## 2018-06-07 DIAGNOSIS — F330 Major depressive disorder, recurrent, mild: Secondary | ICD-10-CM

## 2018-06-22 DIAGNOSIS — Z79891 Long term (current) use of opiate analgesic: Secondary | ICD-10-CM | POA: Diagnosis not present

## 2018-06-22 DIAGNOSIS — M62830 Muscle spasm of back: Secondary | ICD-10-CM | POA: Diagnosis not present

## 2018-06-22 DIAGNOSIS — M47817 Spondylosis without myelopathy or radiculopathy, lumbosacral region: Secondary | ICD-10-CM | POA: Diagnosis not present

## 2018-06-22 DIAGNOSIS — M47812 Spondylosis without myelopathy or radiculopathy, cervical region: Secondary | ICD-10-CM | POA: Diagnosis not present

## 2018-06-22 DIAGNOSIS — G894 Chronic pain syndrome: Secondary | ICD-10-CM | POA: Diagnosis not present

## 2018-07-11 ENCOUNTER — Other Ambulatory Visit: Payer: Self-pay | Admitting: Family Medicine

## 2018-07-27 DIAGNOSIS — M47812 Spondylosis without myelopathy or radiculopathy, cervical region: Secondary | ICD-10-CM | POA: Diagnosis not present

## 2018-07-27 DIAGNOSIS — M47817 Spondylosis without myelopathy or radiculopathy, lumbosacral region: Secondary | ICD-10-CM | POA: Diagnosis not present

## 2018-07-27 DIAGNOSIS — M62830 Muscle spasm of back: Secondary | ICD-10-CM | POA: Diagnosis not present

## 2018-07-27 DIAGNOSIS — G894 Chronic pain syndrome: Secondary | ICD-10-CM | POA: Diagnosis not present

## 2018-07-28 ENCOUNTER — Other Ambulatory Visit: Payer: Self-pay | Admitting: Physical Medicine and Rehabilitation

## 2018-07-28 DIAGNOSIS — M5416 Radiculopathy, lumbar region: Secondary | ICD-10-CM

## 2018-08-14 ENCOUNTER — Other Ambulatory Visit: Payer: Self-pay | Admitting: Family Medicine

## 2018-08-16 NOTE — Telephone Encounter (Signed)
Requesting refill   Valium   LOV: 06/01/17  LRF:  05/24/18

## 2018-08-24 DIAGNOSIS — G894 Chronic pain syndrome: Secondary | ICD-10-CM | POA: Diagnosis not present

## 2018-08-24 DIAGNOSIS — M47812 Spondylosis without myelopathy or radiculopathy, cervical region: Secondary | ICD-10-CM | POA: Diagnosis not present

## 2018-08-24 DIAGNOSIS — M62830 Muscle spasm of back: Secondary | ICD-10-CM | POA: Diagnosis not present

## 2018-08-24 DIAGNOSIS — M47817 Spondylosis without myelopathy or radiculopathy, lumbosacral region: Secondary | ICD-10-CM | POA: Diagnosis not present

## 2018-09-01 ENCOUNTER — Encounter: Payer: Self-pay | Admitting: Family Medicine

## 2018-09-01 ENCOUNTER — Ambulatory Visit (INDEPENDENT_AMBULATORY_CARE_PROVIDER_SITE_OTHER): Payer: PPO | Admitting: Family Medicine

## 2018-09-01 VITALS — BP 110/70 | HR 71 | Temp 98.3°F | Resp 15 | Ht 66.0 in | Wt 149.4 lb

## 2018-09-01 DIAGNOSIS — L309 Dermatitis, unspecified: Secondary | ICD-10-CM

## 2018-09-01 DIAGNOSIS — W540XXA Bitten by dog, initial encounter: Secondary | ICD-10-CM

## 2018-09-01 DIAGNOSIS — F330 Major depressive disorder, recurrent, mild: Secondary | ICD-10-CM | POA: Diagnosis not present

## 2018-09-01 DIAGNOSIS — L03012 Cellulitis of left finger: Secondary | ICD-10-CM

## 2018-09-01 MED ORDER — BUPROPION HCL ER (XL) 300 MG PO TB24: 300.0000 mg | ORAL_TABLET | Freq: Every day | ORAL | 1 refills | Status: DC

## 2018-09-01 MED ORDER — DOXYCYCLINE HYCLATE 100 MG PO TABS: 100.0000 mg | ORAL_TABLET | Freq: Two times a day (BID) | ORAL | 0 refills | Status: AC

## 2018-09-01 MED ORDER — TRIAMCINOLONE ACETONIDE 0.1 % EX CREA: 1.0000 "application " | TOPICAL_CREAM | Freq: Two times a day (BID) | CUTANEOUS | 0 refills | Status: AC

## 2018-09-01 NOTE — Patient Instructions (Addendum)
Stop alcohol, hydrogen peroxide and topical antibiotic ointment  Clean hands once daily with warm water and mild soap (just with your normal bathing is okay), pat dry and bandage with gauze  You can apply the topical steroid twice a day and take oral antibiotic and contributing 48 hours for any worsening of swelling redness pain  Follow up/contact office or come in for recheck in 48 hours  If there is any worsening go to the ER

## 2018-09-01 NOTE — Progress Notes (Signed)
Patient ID: Julie Neal, female    DOB: 08-12-1955, 63 y.o.   MRN: [id number]  PCP: Julie Frizzle, MD  Chief Complaint  Patient presents with  . Animal Bite    was bit by her dog on middle finger of left hand    Subjective:   [name] is a 63 y.o. female, presents to clinic with CC of left middle finger bite by their own dog - happened about a month ago - she said it was punctured on the proximal phalanx volar aspect.  Their dog is UTD on all shots and vaccines.  Patient is up-to-date on tetanus done about 3 years ago.  She has been caring for the wound by bandaging it and doing wound care for it and it has gradually worsened, acutely worsened over the past 3 to 4 days with swelling, redness blisters and weeping.  She states that she has been putting alcohol, hydrogen peroxide and topical antibiotic ointment on it at least 2x a day and covering with bandage for 2 weeks A few days ago it started swelling, blistering to finger 3-4 days, weeping clear fluid, no purulent drainage, itches and is very painful, with some of the swelling going into palm and adjacent fingers.     Patient Active Problem List   Diagnosis Date Noted  . Finger pain, left 06/08/2017  . Helicobacter pylori gastritis   . Asymptomatic stenosis of right carotid artery   . Fracture of ankle, bimalleolar, left, closed 08/10/2014  . Shaking 01/12/2013  . Anxiety   . Memory loss   . Lumbago   . Fibromyalgia   . Migraines   . Hyperlipidemia   . Depression   . Neuralgic migraines   . PULMONARY NODULE 07/25/2010  . RECTAL BLEEDING 04/22/2010  . NAUSEA AND VOMITING 04/22/2010  . CHANGE IN BOWELS 04/22/2010  . RLQ PAIN 04/22/2010  . MIGRAINES, HX OF 01/05/2007     Prior to Admission medications   Medication Sig Start Date End Date Taking? Authorizing Provider  alendronate (FOSAMAX) 70 MG tablet TAKE 1 TABLET BY MOUTH EVERY 7 DAYS WITH FULL GLASS OF WATER ON EMPTY STOMACH 11/26/17  Yes Julie Frizzle, MD  atorvastatin (LIPITOR) 20 MG tablet TAKE 1 TABLET BY MOUTH EVERY DAY 07/12/18  Yes Julie Frizzle, MD  buPROPion (WELLBUTRIN XL) 300 MG 24 hr tablet TAKE 1 TABLET BY MOUTH EVERY DAY 06/04/18  Yes Julie Frizzle, MD  diazepam (VALIUM) 5 MG tablet TAKE 1 TABLET BY MOUTH TWICE A DAY AS NEEDED 11/16/17  Yes Julie Frizzle, MD  DULoxetine (CYMBALTA) 30 MG capsule TAKE 1 CAPSULE BY MOUTH EVERYDAY AT BEDTIME 05/20/17  Yes [provider]  morphine (MSIR) 15 MG tablet Take 15 mg by mouth 4 (four) times daily.   Yes [provider]  tiZANidine (ZANAFLEX) 4 MG tablet TAKE 1 TABLET BY MOUTH 4 TIMES A DAY AS NEEDED 02/17/17  Yes [provider]  traZODone (DESYREL) 100 MG tablet TAKE 2 TABLETS BY MOUTH EVERY EVENING AT BEDTIME 06/07/18  Yes Julie Frizzle, MD  Clobetasol Prop Emollient Base (CLOBETASOL PROPIONATE E) 0.05 % emollient cream Apply 1 application topically 2 (two) times daily. Patient not taking: Reported on 09/01/2018 03/26/17   Julie Frizzle, MD  megestrol (MEGACE) 40 MG/ML suspension TAKE 10 MLS BY MOUTH EVERY DAY Patient not taking: Reported on 09/01/2018 06/12/17   Julie Frizzle, MD     Allergies  Allergen Reactions  .  Mobic [Meloxicam] Anaphylaxis  . Penicillins Anaphylaxis  . Nubain [Nalbuphine Hcl] Diarrhea and Nausea And Vomiting     Family History  Problem Relation Age of Onset  . Heart attack Father   . Heart disease Father 59  . Alzheimer's disease Mother   . Stroke Mother   . Diabetes type II Brother   . Breast cancer Sister      Social History   Socioeconomic History  . Marital status: Married    Spouse name: Julie Neal  . Number of children: 1  . Years of education: college  . Highest education level: Not on file  Occupational History    Comment: disabled  Social Needs  . Financial resource strain: Not on file  . Food insecurity:    Worry: Not on file    Inability: Not on file  . Transportation needs:     Medical: Not on file    Non-medical: Not on file  Tobacco Use  . Smoking status: Former Smoker    Types: Cigarettes  . Smokeless tobacco: Never Used  . Tobacco comment: Quit in 1988  Substance and Sexual Activity  . Alcohol use: No    Alcohol/week: 0.0 standard drinks  . Drug use: No  . Sexual activity: Not on file  Lifestyle  . Physical activity:    Days per week: Not on file    Minutes per session: Not on file  . Stress: Not on file  Relationships  . Social connections:    Talks on phone: Not on file    Gets together: Not on file    Attends religious service: Not on file    Active member of club or organization: Not on file    Attends meetings of clubs or organizations: Not on file    Relationship status: Not on file  . Intimate partner violence:    Fear of current or ex partner: Not on file    Emotionally abused: Not on file    Physically abused: Not on file    Forced sexual activity: Not on file  Other Topics Concern  . Not on file  Social History Narrative   Patient lives at home with her husband Julie Neal)  and has a college education. Patient has one child.   Patient is disabled. Right handed.   Caffeine one cup of coffee.     Review of Systems  Constitutional: Negative.  Negative for activity change, appetite change, chills, diaphoresis, fatigue and fever.  HENT: Negative.   Eyes: Negative.   Respiratory: Negative.   Cardiovascular: Negative.   Gastrointestinal: Negative.  Negative for nausea and vomiting.  Endocrine: Negative.   Genitourinary: Negative.   Musculoskeletal: Negative.  Negative for arthralgias.  Skin: Positive for rash.  Allergic/Immunologic: Negative.   Neurological: Negative.   Hematological: Negative.  Negative for adenopathy.  Psychiatric/Behavioral: Negative.   All other systems reviewed and are negative.      Objective:    Vitals:   09/01/18 1432  BP: 110/70  Pulse: 71  Resp: 15  Temp: 98.3 F (36.8 C)  TempSrc: Oral    SpO2: 98%  Weight: 149 lb 6 oz (67.8 kg)  Height: 5\' 6"  (1.676 m)      Physical Exam Vitals signs and nursing note reviewed.  Constitutional:      General: She is not in acute distress.    Appearance: She is well-developed and normal weight. She is not ill-appearing, toxic-appearing or diaphoretic.  HENT:     Head: Normocephalic and atraumatic.  Nose: Nose normal.  Eyes:     General:        Right eye: No discharge.        Left eye: No discharge.     Conjunctiva/sclera: Conjunctivae normal.  Neck:     Trachea: No tracheal deviation.  Cardiovascular:     Rate and Rhythm: Normal rate and regular rhythm.  Pulmonary:     Effort: Pulmonary effort is normal. No respiratory distress.     Breath sounds: No stridor.  Musculoskeletal: Normal range of motion.     Right hand: She exhibits tenderness and swelling. She exhibits normal range of motion, no bony tenderness and normal capillary refill. Normal sensation noted. Normal strength noted.       Hands:     Comments: Left hand 3rd proximal phalanx with edema, mild erythema, vesicular and papular difffuse erythematous rash - no open wound, no purulent drainage Mild decrease ROM of finger No streaking redness  Skin:    General: Skin is warm and dry.     Findings: No rash.  Neurological:     Mental Status: She is alert.     Motor: No abnormal muscle tone.     Coordination: Coordination normal.  Psychiatric:        Behavior: Behavior normal.           Assessment & Plan:       ICD-10-CM   1. Cellulitis of finger of left hand L03.012 doxycycline (VIBRA-TABS) 100 MG tablet  2. Major depressive disorder, recurrent episode, mild (HCC) F33.0 buPROPion (WELLBUTRIN XL) 300 MG 24 hr tablet  3. Dog bite, initial encounter W54.0XXA doxycycline (VIBRA-TABS) 100 MG tablet  4. Dermatitis L30.9 triamcinolone cream (KENALOG) 0.1 %    Instructions to pt: Stop alcohol, hydrogen peroxide and topical antibiotic ointment  Clean hands  once daily with warm water and mild soap (just with your normal bathing is okay), pat dry and bandage with gauze  You can apply the topical steroid twice a day and take oral antibiotic and contributing 48 hours for any worsening of swelling redness pain  Follow up/contact office or come in for recheck in 48 hours  If there is any worsening go to the ER    Delsa Grana, PA-C 09/01/18 2:46 PM

## 2018-09-02 ENCOUNTER — Encounter: Payer: Self-pay | Admitting: Family Medicine

## 2018-09-03 ENCOUNTER — Other Ambulatory Visit: Payer: Self-pay | Admitting: Physical Medicine and Rehabilitation

## 2018-09-03 DIAGNOSIS — M5416 Radiculopathy, lumbar region: Secondary | ICD-10-CM

## 2018-09-08 ENCOUNTER — Ambulatory Visit
Admission: RE | Admit: 2018-09-08 | Discharge: 2018-09-08 | Disposition: A | Discharge status: Home / Self Care | Payer: PPO | Source: Ambulatory Visit | Attending: Physical Medicine and Rehabilitation | Admitting: Physical Medicine and Rehabilitation

## 2018-09-08 DIAGNOSIS — M545 Low back pain: Secondary | ICD-10-CM | POA: Diagnosis not present

## 2018-09-08 DIAGNOSIS — M5416 Radiculopathy, lumbar region: Secondary | ICD-10-CM

## 2018-09-21 DIAGNOSIS — M47812 Spondylosis without myelopathy or radiculopathy, cervical region: Secondary | ICD-10-CM | POA: Diagnosis not present

## 2018-09-21 DIAGNOSIS — M47817 Spondylosis without myelopathy or radiculopathy, lumbosacral region: Secondary | ICD-10-CM | POA: Diagnosis not present

## 2018-09-21 DIAGNOSIS — M62830 Muscle spasm of back: Secondary | ICD-10-CM | POA: Diagnosis not present

## 2018-09-21 DIAGNOSIS — G894 Chronic pain syndrome: Secondary | ICD-10-CM | POA: Diagnosis not present

## 2018-10-19 DIAGNOSIS — M62830 Muscle spasm of back: Secondary | ICD-10-CM | POA: Diagnosis not present

## 2018-10-19 DIAGNOSIS — M47817 Spondylosis without myelopathy or radiculopathy, lumbosacral region: Secondary | ICD-10-CM | POA: Diagnosis not present

## 2018-10-19 DIAGNOSIS — G894 Chronic pain syndrome: Secondary | ICD-10-CM | POA: Diagnosis not present

## 2018-10-19 DIAGNOSIS — M47812 Spondylosis without myelopathy or radiculopathy, cervical region: Secondary | ICD-10-CM | POA: Diagnosis not present

## 2018-11-08 ENCOUNTER — Other Ambulatory Visit: Payer: Self-pay | Admitting: Family Medicine

## 2018-11-08 NOTE — Telephone Encounter (Signed)
Requesting refill    valium  LOV: 06/01/17 with WTP - 09/01/18 with Kristeen Miss  LRF:  11/16/17

## 2018-11-16 DIAGNOSIS — M545 Low back pain: Secondary | ICD-10-CM | POA: Diagnosis not present

## 2018-11-16 DIAGNOSIS — M47812 Spondylosis without myelopathy or radiculopathy, cervical region: Secondary | ICD-10-CM | POA: Diagnosis not present

## 2018-11-16 DIAGNOSIS — Z8639 Personal history of other endocrine, nutritional and metabolic disease: Secondary | ICD-10-CM | POA: Diagnosis not present

## 2018-11-16 DIAGNOSIS — L03116 Cellulitis of left lower limb: Secondary | ICD-10-CM | POA: Diagnosis not present

## 2018-11-16 DIAGNOSIS — R351 Nocturia: Secondary | ICD-10-CM | POA: Diagnosis not present

## 2018-11-16 DIAGNOSIS — Z4682 Encounter for fitting and adjustment of non-vascular catheter: Secondary | ICD-10-CM | POA: Diagnosis not present

## 2018-11-16 DIAGNOSIS — M86472 Chronic osteomyelitis with draining sinus, left ankle and foot: Secondary | ICD-10-CM | POA: Diagnosis not present

## 2018-11-16 DIAGNOSIS — Z89412 Acquired absence of left great toe: Secondary | ICD-10-CM | POA: Diagnosis not present

## 2018-11-16 DIAGNOSIS — C61 Malignant neoplasm of prostate: Secondary | ICD-10-CM | POA: Diagnosis not present

## 2018-11-16 DIAGNOSIS — R350 Frequency of micturition: Secondary | ICD-10-CM | POA: Diagnosis not present

## 2018-11-16 DIAGNOSIS — I2692 Saddle embolus of pulmonary artery without acute cor pulmonale: Secondary | ICD-10-CM | POA: Diagnosis not present

## 2018-11-16 DIAGNOSIS — J449 Chronic obstructive pulmonary disease, unspecified: Secondary | ICD-10-CM | POA: Diagnosis not present

## 2018-11-16 DIAGNOSIS — M62830 Muscle spasm of back: Secondary | ICD-10-CM | POA: Diagnosis not present

## 2018-11-16 DIAGNOSIS — I96 Gangrene, not elsewhere classified: Secondary | ICD-10-CM | POA: Diagnosis not present

## 2018-11-16 DIAGNOSIS — E538 Deficiency of other specified B group vitamins: Secondary | ICD-10-CM | POA: Diagnosis not present

## 2018-11-16 DIAGNOSIS — M6281 Muscle weakness (generalized): Secondary | ICD-10-CM | POA: Diagnosis not present

## 2018-11-16 DIAGNOSIS — G894 Chronic pain syndrome: Secondary | ICD-10-CM | POA: Diagnosis not present

## 2018-11-16 DIAGNOSIS — M47817 Spondylosis without myelopathy or radiculopathy, lumbosacral region: Secondary | ICD-10-CM | POA: Diagnosis not present

## 2018-11-30 DIAGNOSIS — M47816 Spondylosis without myelopathy or radiculopathy, lumbar region: Secondary | ICD-10-CM | POA: Diagnosis not present

## 2018-12-03 ENCOUNTER — Other Ambulatory Visit: Payer: Self-pay | Admitting: *Deleted

## 2018-12-03 DIAGNOSIS — F330 Major depressive disorder, recurrent, mild: Secondary | ICD-10-CM

## 2018-12-03 MED ORDER — TRAZODONE HCL 100 MG PO TABS: ORAL_TABLET | ORAL | 1 refills | Status: DC

## 2018-12-14 DIAGNOSIS — G894 Chronic pain syndrome: Secondary | ICD-10-CM | POA: Diagnosis not present

## 2018-12-14 DIAGNOSIS — M47817 Spondylosis without myelopathy or radiculopathy, lumbosacral region: Secondary | ICD-10-CM | POA: Diagnosis not present

## 2018-12-14 DIAGNOSIS — M62830 Muscle spasm of back: Secondary | ICD-10-CM | POA: Diagnosis not present

## 2018-12-14 DIAGNOSIS — M47812 Spondylosis without myelopathy or radiculopathy, cervical region: Secondary | ICD-10-CM | POA: Diagnosis not present

## 2018-12-22 DIAGNOSIS — M47817 Spondylosis without myelopathy or radiculopathy, lumbosacral region: Secondary | ICD-10-CM | POA: Diagnosis not present

## 2018-12-22 DIAGNOSIS — M47812 Spondylosis without myelopathy or radiculopathy, cervical region: Secondary | ICD-10-CM | POA: Diagnosis not present

## 2018-12-22 DIAGNOSIS — G894 Chronic pain syndrome: Secondary | ICD-10-CM | POA: Diagnosis not present

## 2018-12-22 DIAGNOSIS — M62830 Muscle spasm of back: Secondary | ICD-10-CM | POA: Diagnosis not present

## 2019-01-19 DIAGNOSIS — M47812 Spondylosis without myelopathy or radiculopathy, cervical region: Secondary | ICD-10-CM | POA: Diagnosis not present

## 2019-01-19 DIAGNOSIS — M62830 Muscle spasm of back: Secondary | ICD-10-CM | POA: Diagnosis not present

## 2019-01-19 DIAGNOSIS — G894 Chronic pain syndrome: Secondary | ICD-10-CM | POA: Diagnosis not present

## 2019-01-19 DIAGNOSIS — Z79891 Long term (current) use of opiate analgesic: Secondary | ICD-10-CM | POA: Diagnosis not present

## 2019-01-19 DIAGNOSIS — M47817 Spondylosis without myelopathy or radiculopathy, lumbosacral region: Secondary | ICD-10-CM | POA: Diagnosis not present

## 2019-01-29 ENCOUNTER — Other Ambulatory Visit: Payer: Self-pay | Admitting: Family Medicine

## 2019-01-29 ENCOUNTER — Ambulatory Visit (HOSPITAL_COMMUNITY)
Admission: EM | Admit: 2019-01-29 | Discharge: 2019-01-29 | Disposition: A | Discharge status: Home / Self Care | Payer: PPO | Attending: Emergency Medicine | Admitting: Emergency Medicine

## 2019-01-29 ENCOUNTER — Other Ambulatory Visit: Payer: Self-pay

## 2019-01-29 ENCOUNTER — Ambulatory Visit (INDEPENDENT_AMBULATORY_CARE_PROVIDER_SITE_OTHER): Payer: PPO

## 2019-01-29 ENCOUNTER — Encounter (HOSPITAL_COMMUNITY): Payer: Self-pay | Admitting: Emergency Medicine

## 2019-01-29 DIAGNOSIS — M79642 Pain in left hand: Secondary | ICD-10-CM | POA: Diagnosis not present

## 2019-01-29 DIAGNOSIS — L03818 Cellulitis of other sites: Secondary | ICD-10-CM

## 2019-01-29 DIAGNOSIS — W540XXA Bitten by dog, initial encounter: Secondary | ICD-10-CM

## 2019-01-29 DIAGNOSIS — B9689 Other specified bacterial agents as the cause of diseases classified elsewhere: Secondary | ICD-10-CM

## 2019-01-29 DIAGNOSIS — F330 Major depressive disorder, recurrent, mild: Secondary | ICD-10-CM

## 2019-01-29 MED ORDER — DOXYCYCLINE HYCLATE 100 MG PO CAPS: 100.0000 mg | ORAL_CAPSULE | Freq: Two times a day (BID) | ORAL | 0 refills | Status: AC

## 2019-01-29 NOTE — ED Provider Notes (Signed)
[facility]    CSN: [id number] Arrival date & time: 01/29/19  1003     History   Chief Complaint Chief Complaint  Patient presents with  . Hand Pain    HPI Julie Neal is a 63 y.o. female history of fibromyalgia and osteoporosis presenting for severe left hand pain status post dog bite.  Patient states her puppy dog bit her 2 days ago and broke the skin.  Patient states there is minimal bleeding, controlled with direct pressure.  Patient states that her dog is up-to-date on all vaccines, low concern for rabies.  Patient has noticed significant increase in pain over the last 24 hours, accompanied with erythema.  Patient denies fever, chest pain, difficulty breathing, purulent discharge, though endorses decreased strength in digits 3 through 5.  Denies numbness, tingling.    Past Medical History:  Diagnosis Date  . Anxiety   . Asymptomatic stenosis of right carotid artery    1-30%  . Cervicalgia   . Depression   . Fibromyalgia   . Fracture of ankle, bimalleolar, left, closed 08/10/2014  . Helicobacter pylori gastritis 10/2016  . Hyperlipidemia   . Lumbago   . Memory loss   . Migraines   . Neuralgic migraines   . Osteoporosis     Patient Active Problem List   Diagnosis Date Noted  . Finger pain, left 06/08/2017  . Helicobacter pylori gastritis   . Asymptomatic stenosis of right carotid artery   . Fracture of ankle, bimalleolar, left, closed 08/10/2014  . Shaking 01/12/2013  . Anxiety   . Memory loss   . Lumbago   . Fibromyalgia   . Migraines   . Hyperlipidemia   . Depression   . Neuralgic migraines   . PULMONARY NODULE 07/25/2010  . RECTAL BLEEDING 04/22/2010  . NAUSEA AND VOMITING 04/22/2010  . CHANGE IN BOWELS 04/22/2010  . RLQ PAIN 04/22/2010  . MIGRAINES, HX OF 01/05/2007    Past Surgical History:  Procedure Laterality Date  . ABDOMINAL HYSTERECTOMY    . BACK SURGERY    . HAND SURGERY Bilateral   . LEG SURGERY Bilateral   . NECK  SURGERY    . SHOULDER SURGERY Left     OB History   No obstetric history on file.      Home Medications    Prior to Admission medications   Medication Sig Start Date End Date Taking? Authorizing Provider  atorvastatin (LIPITOR) 20 MG tablet TAKE 1 TABLET BY MOUTH EVERY DAY 07/12/18  Yes Susy Frizzle, MD  buPROPion (WELLBUTRIN XL) 300 MG 24 hr tablet Take 1 tablet (300 mg total) by mouth daily. 09/01/18  Yes Delsa Grana, PA-C  diazepam (VALIUM) 5 MG tablet TAKE 1 TABLET BY MOUTH TWICE A DAY AS NEEDED 11/08/18  Yes Susy Frizzle, MD  DULoxetine (CYMBALTA) 30 MG capsule TAKE 1 CAPSULE BY MOUTH EVERYDAY AT BEDTIME 05/20/17  Yes [provider]  morphine (MSIR) 15 MG tablet Take 15 mg by mouth 4 (four) times daily.   Yes [provider]  tiZANidine (ZANAFLEX) 4 MG tablet TAKE 1 TABLET BY MOUTH 4 TIMES A DAY AS NEEDED 02/17/17  Yes [provider]  traZODone (DESYREL) 100 MG tablet TAKE 2 TABLETS BY MOUTH EVERY EVENING AT BEDTIME 12/03/18  Yes Susy Frizzle, MD  alendronate (FOSAMAX) 70 MG tablet TAKE 1 TABLET BY MOUTH EVERY 7 DAYS WITH FULL GLASS OF WATER ON EMPTY STOMACH 11/26/17   Susy Frizzle, MD  Clobetasol  Prop Emollient Base (CLOBETASOL PROPIONATE E) 0.05 % emollient cream Apply 1 application topically 2 (two) times daily. Patient not taking: Reported on 09/01/2018 03/26/17   Susy Frizzle, MD  doxycycline (VIBRAMYCIN) 100 MG capsule Take 1 capsule (100 mg total) by mouth 2 (two) times daily for 10 days. 01/29/19 02/08/19  Hall-Potvin, [place], PA-C  megestrol (MEGACE) 40 MG/ML suspension TAKE 10 MLS BY MOUTH EVERY DAY Patient not taking: Reported on 09/01/2018 06/12/17   Susy Frizzle, MD    Family History Family History  Problem Relation Age of Onset  . Heart attack Father   . Heart disease Father 48  . Alzheimer's disease Mother   . Stroke Mother   . Diabetes type II Brother   . Breast cancer Sister     Social History Social  History   Tobacco Use  . Smoking status: Former Smoker    Types: Cigarettes  . Smokeless tobacco: Never Used  . Tobacco comment: Quit in 1988  Substance Use Topics  . Alcohol use: No    Alcohol/week: 0.0 standard drinks  . Drug use: No     Allergies   Mobic [meloxicam], Penicillins, and Nubain [nalbuphine hcl]   Review of Systems As per HPI   Physical Exam Triage Vital Signs ED Triage Vitals [01/29/19 1022]  Enc Vitals Group     BP      Pulse      Resp      Temp      Temp src      SpO2      Weight      Height      Head Circumference      Peak Flow      Pain Score 7     Pain Loc      Pain Edu?      Excl. in [place]?    No data found.  Updated Vital Signs BP 108/62 (BP Location: Right Arm)   Pulse 75   Temp 98.9 F (37.2 C) (Oral)   Resp 18   SpO2 97%   Visual Acuity Right Eye Distance:   Left Eye Distance:   Bilateral Distance:    Right Eye Near:   Left Eye Near:    Bilateral Near:     Physical Exam Constitutional:      General: She is not in acute distress. HENT:     Head: Normocephalic and atraumatic.  Eyes:     General: No scleral icterus.    Pupils: Pupils are equal, round, and reactive to light.  Cardiovascular:     Rate and Rhythm: Normal rate.  Pulmonary:     Effort: Pulmonary effort is normal.  Skin:    Comments: For visible puncture wounds on dorsal and ventral aspect of left hand.  Dorsal aspect of hand with erythema and exquisite tenderness, specifically over metacarpals of digits 3 through 5.  No discernible fluctuance, ecchymosis, masses.  Patient has decreased strength and ROM in digits 3 through 5.  All digits, and hand neurovascularly intact bilaterally and symmetric as compared to right.  Neurological:     Mental Status: She is alert and oriented to person, place, and time.      UC Treatments / Results  Labs (all labs ordered are listed, but only abnormal results are displayed) Labs Reviewed - No data to  display  EKG None  Radiology Dg Hand Complete Left  Result Date: 01/29/2019 CLINICAL DATA:  Severe pain and decreased strength and range of motion  after dog bites in third through fifth fingers EXAM: LEFT HAND - COMPLETE 3+ VIEW COMPARISON:  None. FINDINGS: No fracture or dislocation. No suspicious focal osseous lesions. No osseous erosions, periosteal reaction or appreciable soft tissue gas. No radiopaque foreign body. Nonspecific punctate periarticular calcification at the ulnar aspect of the DIP joint in the left fourth finger. IMPRESSION: No specific radiographic findings of osteomyelitis. No fracture or malalignment. Electronically Signed   By: Ilona Sorrel M.D.   On: 01/29/2019 11:30    Procedures Procedures (including critical care time)  Medications Ordered in UC Medications - No data to display  Initial Impression / Assessment and Plan / UC Course  I have reviewed the triage vital signs and the nursing notes.  Pertinent labs & imaging results that were available during my care of the patient were reviewed by me and considered in my medical decision making (see chart for details).     63 year old female with history of fibromyalgia, osteoporosis presenting for worsening left hand pain and redness status post dog bite.  Dog is up-to-date on immunizations, low concern for rabies.  Hand x-ray done in office today due to exquisite tenderness in conjunction with decreased strength and range of motion.  Imaging reviewed by radiologist: " No specific radiographic findings of osteomyelitis, no fracture or malalignment."  Patient to start antibiotics for cellulitis, ice, OTC analgesics, and do hand stretches daily.  Return precautions discussed, patient verbalized understanding. Final Clinical Impressions(s) / UC Diagnoses   Final diagnoses:  Cellulitis of other specified site  Dog bite, initial encounter     Discharge Instructions     Antibiotic twice daily as prescribed with  food. Return to clinic if you develop worsening redness, pain, swelling, or not able to make a fist in 1 week.    ED Prescriptions    Medication Sig Dispense Auth. Provider   doxycycline (VIBRAMYCIN) 100 MG capsule Take 1 capsule (100 mg total) by mouth 2 (two) times daily for 10 days. 20 capsule Hall-Potvin, [place], PA-C     Controlled Substance Prescriptions [place] Controlled Substance Registry consulted? Not Applicable   Quincy Sheehan, [place] 01/29/19 1924

## 2019-01-29 NOTE — ED Triage Notes (Signed)
Patient reports her dog bit her left hand.  States all animal shots are current.  Patient says this happened 6/18.  4 visible puncture sites on back of left hand.  One site on back of hand is red and warm to touch, and painful

## 2019-01-29 NOTE — Discharge Instructions (Signed)
Antibiotic twice daily as prescribed with food. Return to clinic if you develop worsening redness, pain, swelling, or not able to make a fist in 1 week.

## 2019-01-31 NOTE — Telephone Encounter (Signed)
Requested Prescriptions   Pending Prescriptions Disp Refills  . buPROPion (WELLBUTRIN XL) 300 MG 24 hr tablet [Pharmacy Med Name: BUPROPION HCL XL 300 MG TABLET] 90 tablet 1    Sig: TAKE 1 TABLET BY MOUTH EVERY DAY   Last OV 09/01/2018 Last written 09/01/2018

## 2019-02-01 NOTE — Telephone Encounter (Signed)
Can you look at this med refill request.  I would like for it to go back to her PCP.  I saw her once for an infection, I'm sure she just asked for med refills and I refilled what was on the chart but did not assess it all.  I would defer to Dr. Dennard Schaumann - don't know if he needs to see her or not?  Thanks Lovey Newcomer (sorry to bother you)

## 2019-02-16 DIAGNOSIS — M47812 Spondylosis without myelopathy or radiculopathy, cervical region: Secondary | ICD-10-CM | POA: Diagnosis not present

## 2019-02-16 DIAGNOSIS — M62830 Muscle spasm of back: Secondary | ICD-10-CM | POA: Diagnosis not present

## 2019-02-16 DIAGNOSIS — G894 Chronic pain syndrome: Secondary | ICD-10-CM | POA: Diagnosis not present

## 2019-02-16 DIAGNOSIS — M47817 Spondylosis without myelopathy or radiculopathy, lumbosacral region: Secondary | ICD-10-CM | POA: Diagnosis not present

## 2019-02-18 ENCOUNTER — Other Ambulatory Visit: Payer: Self-pay | Admitting: *Deleted

## 2019-02-18 ENCOUNTER — Other Ambulatory Visit: Payer: Self-pay | Admitting: Family Medicine

## 2019-02-18 MED ORDER — DIAZEPAM 5 MG PO TABS: 5.0000 mg | ORAL_TABLET | Freq: Two times a day (BID) | ORAL | 0 refills | Status: DC | PRN

## 2019-02-18 MED ORDER — ALENDRONATE SODIUM 70 MG PO TABS: ORAL_TABLET | ORAL | 4 refills | Status: DC

## 2019-02-18 NOTE — Telephone Encounter (Signed)
Requesting refill  valium  LOV: 09/01/18  LRF:  11/08/18

## 2019-03-16 DIAGNOSIS — M7552 Bursitis of left shoulder: Secondary | ICD-10-CM | POA: Diagnosis not present

## 2019-03-16 DIAGNOSIS — G894 Chronic pain syndrome: Secondary | ICD-10-CM | POA: Diagnosis not present

## 2019-03-16 DIAGNOSIS — M47812 Spondylosis without myelopathy or radiculopathy, cervical region: Secondary | ICD-10-CM | POA: Diagnosis not present

## 2019-03-16 DIAGNOSIS — M62830 Muscle spasm of back: Secondary | ICD-10-CM | POA: Diagnosis not present

## 2019-03-16 DIAGNOSIS — M47817 Spondylosis without myelopathy or radiculopathy, lumbosacral region: Secondary | ICD-10-CM | POA: Diagnosis not present

## 2019-04-05 ENCOUNTER — Other Ambulatory Visit: Payer: Self-pay | Admitting: Family Medicine

## 2019-04-14 DIAGNOSIS — M47812 Spondylosis without myelopathy or radiculopathy, cervical region: Secondary | ICD-10-CM | POA: Diagnosis not present

## 2019-04-14 DIAGNOSIS — M62830 Muscle spasm of back: Secondary | ICD-10-CM | POA: Diagnosis not present

## 2019-04-14 DIAGNOSIS — G894 Chronic pain syndrome: Secondary | ICD-10-CM | POA: Diagnosis not present

## 2019-04-14 DIAGNOSIS — M47817 Spondylosis without myelopathy or radiculopathy, lumbosacral region: Secondary | ICD-10-CM | POA: Diagnosis not present

## 2019-05-18 ENCOUNTER — Other Ambulatory Visit: Payer: Self-pay | Admitting: Family Medicine

## 2019-05-18 DIAGNOSIS — F330 Major depressive disorder, recurrent, mild: Secondary | ICD-10-CM

## 2019-05-18 DIAGNOSIS — M47817 Spondylosis without myelopathy or radiculopathy, lumbosacral region: Secondary | ICD-10-CM | POA: Diagnosis not present

## 2019-05-18 DIAGNOSIS — M62830 Muscle spasm of back: Secondary | ICD-10-CM | POA: Diagnosis not present

## 2019-05-18 DIAGNOSIS — M47812 Spondylosis without myelopathy or radiculopathy, cervical region: Secondary | ICD-10-CM | POA: Diagnosis not present

## 2019-05-18 DIAGNOSIS — G894 Chronic pain syndrome: Secondary | ICD-10-CM | POA: Diagnosis not present

## 2019-05-24 ENCOUNTER — Other Ambulatory Visit: Payer: Self-pay

## 2019-05-24 ENCOUNTER — Telehealth: Payer: Self-pay | Admitting: Family Medicine

## 2019-05-24 MED ORDER — DIAZEPAM 5 MG PO TABS: 5.0000 mg | ORAL_TABLET | Freq: Two times a day (BID) | ORAL | 0 refills | Status: DC | PRN

## 2019-05-24 NOTE — Telephone Encounter (Signed)
Requested Prescriptions   Pending Prescriptions Disp Refills  . diazepam (VALIUM) 5 MG tablet 180 tablet 0    Sig: Take 1 tablet (5 mg total) by mouth 2 (two) times daily as needed.    Last OV 09/01/2018  Last written 02/18/2019

## 2019-05-24 NOTE — Telephone Encounter (Signed)
Patient requesting a refill on her valium called into [facility] on [address].  CB# [phone]

## 2019-06-15 DIAGNOSIS — G894 Chronic pain syndrome: Secondary | ICD-10-CM | POA: Diagnosis not present

## 2019-06-15 DIAGNOSIS — M62830 Muscle spasm of back: Secondary | ICD-10-CM | POA: Diagnosis not present

## 2019-06-15 DIAGNOSIS — M47812 Spondylosis without myelopathy or radiculopathy, cervical region: Secondary | ICD-10-CM | POA: Diagnosis not present

## 2019-06-15 DIAGNOSIS — M47817 Spondylosis without myelopathy or radiculopathy, lumbosacral region: Secondary | ICD-10-CM | POA: Diagnosis not present

## 2019-07-14 DIAGNOSIS — M47812 Spondylosis without myelopathy or radiculopathy, cervical region: Secondary | ICD-10-CM | POA: Diagnosis not present

## 2019-07-14 DIAGNOSIS — M62830 Muscle spasm of back: Secondary | ICD-10-CM | POA: Diagnosis not present

## 2019-07-14 DIAGNOSIS — M47817 Spondylosis without myelopathy or radiculopathy, lumbosacral region: Secondary | ICD-10-CM | POA: Diagnosis not present

## 2019-07-14 DIAGNOSIS — G894 Chronic pain syndrome: Secondary | ICD-10-CM | POA: Diagnosis not present

## 2019-07-29 ENCOUNTER — Other Ambulatory Visit: Payer: Self-pay | Admitting: Family Medicine

## 2019-07-29 DIAGNOSIS — F330 Major depressive disorder, recurrent, mild: Secondary | ICD-10-CM

## 2019-08-29 ENCOUNTER — Other Ambulatory Visit: Payer: Self-pay | Admitting: Family Medicine

## 2019-08-29 DIAGNOSIS — F330 Major depressive disorder, recurrent, mild: Secondary | ICD-10-CM

## 2019-08-29 NOTE — Telephone Encounter (Signed)
Patient is calling to get refill on her calcium, bupropion, and diazepam  [facility] on [address]

## 2019-08-30 MED ORDER — BUPROPION HCL ER (XL) 300 MG PO TB24: 300.0000 mg | ORAL_TABLET | Freq: Every day | ORAL | 1 refills | Status: DC

## 2019-08-30 MED ORDER — ALENDRONATE SODIUM 70 MG PO TABS: 70.0000 mg | ORAL_TABLET | ORAL | 3 refills | Status: DC

## 2019-08-30 MED ORDER — DIAZEPAM 5 MG PO TABS: 5.0000 mg | ORAL_TABLET | Freq: Two times a day (BID) | ORAL | 0 refills | Status: DC | PRN

## 2019-08-30 NOTE — Telephone Encounter (Signed)
Requesting refill    Valium (I set up for 30 days supply with needs ov and will send letter)   LOV:  06/01/2017 WTP   09/01/18 Leisa  LRF:  05/24/19

## 2019-09-14 DIAGNOSIS — M47812 Spondylosis without myelopathy or radiculopathy, cervical region: Secondary | ICD-10-CM | POA: Diagnosis not present

## 2019-09-14 DIAGNOSIS — G894 Chronic pain syndrome: Secondary | ICD-10-CM | POA: Diagnosis not present

## 2019-09-14 DIAGNOSIS — M47817 Spondylosis without myelopathy or radiculopathy, lumbosacral region: Secondary | ICD-10-CM | POA: Diagnosis not present

## 2019-09-14 DIAGNOSIS — M62830 Muscle spasm of back: Secondary | ICD-10-CM | POA: Diagnosis not present

## 2019-09-22 ENCOUNTER — Other Ambulatory Visit: Payer: Self-pay

## 2019-09-22 ENCOUNTER — Encounter: Payer: Self-pay | Admitting: Family Medicine

## 2019-09-22 ENCOUNTER — Ambulatory Visit (INDEPENDENT_AMBULATORY_CARE_PROVIDER_SITE_OTHER): Payer: Medicare Other | Admitting: Family Medicine

## 2019-09-22 VITALS — BP 110/62 | HR 68 | Temp 96.8°F | Resp 14 | Ht 66.0 in | Wt 146.0 lb

## 2019-09-22 DIAGNOSIS — I6521 Occlusion and stenosis of right carotid artery: Secondary | ICD-10-CM | POA: Diagnosis not present

## 2019-09-22 DIAGNOSIS — M797 Fibromyalgia: Secondary | ICD-10-CM | POA: Diagnosis not present

## 2019-09-22 DIAGNOSIS — E7800 Pure hypercholesterolemia, unspecified: Secondary | ICD-10-CM | POA: Diagnosis not present

## 2019-09-22 DIAGNOSIS — F330 Major depressive disorder, recurrent, mild: Secondary | ICD-10-CM | POA: Diagnosis not present

## 2019-09-22 DIAGNOSIS — M810 Age-related osteoporosis without current pathological fracture: Secondary | ICD-10-CM

## 2019-09-22 DIAGNOSIS — R7309 Other abnormal glucose: Secondary | ICD-10-CM | POA: Diagnosis not present

## 2019-09-22 DIAGNOSIS — Z79899 Other long term (current) drug therapy: Secondary | ICD-10-CM

## 2019-09-22 NOTE — Progress Notes (Signed)
Subjective:    Patient ID: Julie Neal, female    DOB: Nov 21, 1955, 64 y.o.   MRN: [id number]  HPI Patient is here today at my request for follow-up.  She has not been seen in quite some time.  Unfortunately her husband passed away earlier this year due to pulmonary fibrosis.  She is still mourning his loss.  My biggest concern for this patient is polypharmacy.  She goes to a pain clinic for arthritis.  They are prescribing Percocet.  She was previously taking morphine.  She has a history of severe muscle spasms in the left side of her neck for which she takes Valium 5 mg twice a day.  This occurred after surgery on her neck which led to complications.  She also takes tizanidine frequently throughout the day for muscle cramps and muscle spasms.  In addition to this she takes trazodone at night 200 mg to help her sleep.  Today I spent 10 minutes with the patient explaining my concern about her polypharmacy.  I am worried about the medications potentially causing her to be sedated, causing dizziness, causing confusion, or leading to falls.  I recommended that she try to take less tizanidine and less trazodone given the fact she feels that the oxycodone and the Valium or absolutely necessary for functioning.  She denies any dizziness or falls.  She also uses a walker to help with balance due to weakness.  She had a bone density test performed in 2017 which showed severe osteoporosis with a T score of -6!  She is on Fosamax for this but she is long overdue to repeat her bone density test.  If no better or worsening, she would be a good candidate for bone building therapy.  She is not taking calcium.  She is not taking vitamin D.  She also has a history of asymptomatic stenosis of the right carotid artery.  She is overdue to recheck this.  She is on Lipitor for this.  Her blood pressure today is well controlled at 110/62 Past Medical History:  Diagnosis Date  . Anxiety   . Asymptomatic stenosis of right  carotid artery    1-30%  . Cervicalgia   . Depression   . Fibromyalgia   . Fracture of ankle, bimalleolar, left, closed 08/10/2014  . Helicobacter pylori gastritis 10/2016  . Hyperlipidemia   . Lumbago   . Memory loss   . Migraines   . Neuralgic migraines   . Osteoporosis    Past Surgical History:  Procedure Laterality Date  . ABDOMINAL HYSTERECTOMY    . BACK SURGERY    . HAND SURGERY Bilateral   . LEG SURGERY Bilateral   . NECK SURGERY    . SHOULDER SURGERY Left    Current Outpatient Medications on File Prior to Visit  Medication Sig Dispense Refill  . alendronate (FOSAMAX) 70 MG tablet Take 1 tablet (70 mg total) by mouth once a week. TAKE 1 TABLET BY MOUTH EVERY 7 DAYS WITH FULL GLASS OF WATER ON EMPTY STOMACH 12 tablet 3  . atorvastatin (LIPITOR) 20 MG tablet TAKE 1 TABLET BY MOUTH EVERY DAY 90 tablet 3  . buPROPion (WELLBUTRIN XL) 300 MG 24 hr tablet Take 1 tablet (300 mg total) by mouth daily. 90 tablet 1  . diazepam (VALIUM) 5 MG tablet Take 1 tablet (5 mg total) by mouth 2 (two) times daily as needed. 60 tablet 0  . DULoxetine (CYMBALTA) 30 MG capsule TAKE 1 CAPSULE BY MOUTH EVERYDAY  AT BEDTIME  1  . oxyCODONE-acetaminophen (PERCOCET/ROXICET) 5-325 MG tablet Take 1 tablet by mouth 2 (two) times daily.    Marland Kitchen tiZANidine (ZANAFLEX) 4 MG tablet TAKE 1 TABLET BY MOUTH 4 TIMES A DAY AS NEEDED  0  . traZODone (DESYREL) 100 MG tablet TAKE 2 TABLETS (200MG ) BY MOUTH EVERY EVENING AT BEDTIME 180 tablet 1   No current facility-administered medications on file prior to visit.    Allergies  Allergen Reactions  . Mobic [Meloxicam] Anaphylaxis  . Penicillins Anaphylaxis  . Nubain [Nalbuphine Hcl] Diarrhea and Nausea And Vomiting   Social History   Socioeconomic History  . Marital status: Married    Spouse name: [name]  . Number of children: 1  . Years of education: college  . Highest education level: Not on file  Occupational History    Comment: disabled  Tobacco Use  .  Smoking status: Former Smoker    Types: Cigarettes  . Smokeless tobacco: Never Used  . Tobacco comment: Quit in 1986/11/10  Substance and Sexual Activity  . Alcohol use: No    Alcohol/week: 0.0 standard drinks  . Drug use: No  . Sexual activity: Not on file  Other Topics Concern  . Not on file  Social History Narrative   Has a college education. Husband died 11-10-2019 (pulmonary fibrosis).  Patient has one child.   Patient is disabled. Right handed.   Caffeine one cup of coffee.   Social Determinants of Health   Financial Resource Strain:   . Difficulty of Paying Living Expenses: Not on file  Food Insecurity:   . Worried About Charity fundraiser in the Last Year: Not on file  . Ran Out of Food in the Last Year: Not on file  Transportation Needs:   . Lack of Transportation (Medical): Not on file  . Lack of Transportation (Non-Medical): Not on file  Physical Activity:   . Days of Exercise per Week: Not on file  . Minutes of Exercise per Session: Not on file  Stress:   . Feeling of Stress : Not on file  Social Connections:   . Frequency of Communication with Friends and Family: Not on file  . Frequency of Social Gatherings with Friends and Family: Not on file  . Attends Religious Services: Not on file  . Active Member of Clubs or Organizations: Not on file  . Attends Archivist Meetings: Not on file  . Marital Status: Not on file  Intimate Partner Violence:   . Fear of Current or Ex-Partner: Not on file  . Emotionally Abused: Not on file  . Physically Abused: Not on file  . Sexually Abused: Not on file       Review of Systems  All other systems reviewed and are negative.      Objective:   Physical Exam  Constitutional: She appears well-developed and well-nourished. No distress.  Neck: No thyromegaly present.  Cardiovascular: Normal rate, regular rhythm and normal heart sounds.  No murmur heard. Pulmonary/Chest: Effort normal and breath sounds normal. No  respiratory distress. She has no wheezes. She has no rales.  Abdominal: Soft. Bowel sounds are normal. She exhibits no distension. There is no abdominal tenderness. There is no rebound and no guarding.  Musculoskeletal:     Cervical back: Neck supple.  Skin: She is not diaphoretic.  Vitals reviewed.         Assessment & Plan:  Pure hypercholesterolemia - Plan: CBC with Differential/Platelet, COMPLETE METABOLIC PANEL WITH GFR, Lipid  panel  Asymptomatic stenosis of right carotid artery - Plan: CBC with Differential/Platelet, COMPLETE METABOLIC PANEL WITH GFR, Lipid panel, US Carotid Duplex Bilateral  Fibromyalgia  Major depressive disorder, recurrent episode, mild (HCC)  Polypharmacy  Osteoporosis, unspecified osteoporosis type, unspecified pathological fracture presence - Plan: DG Bone Density  I will check a carotid ultrasound to reevaluate the stenosis in the right carotid artery.  I would like her LDL cholesterol to be below 70.  If the stenosis has progressed, I would also recommend an aspirin 81 mg daily.  Patient's depression has worsened due to grieving over the loss of her husband.  However my biggest concern for the patient is polypharmacy.  I have recommended that she try to discontinue or decrease her use of tizanidine and trazodone gradually over time to avoid this polypharmacy which puts her at higher risk for falls and confusion.  Patient is fortunately living with her sister and does have 24-hour daycare.  I am also very concerned about the severity of her osteoporosis.  I will recheck a bone density test.  I encouraged her to use 1200 mg a day of calcium and 1000 units a day of vitamin D.  If worsening, the patient may need to be considered for Reclast or bone building therapy.

## 2019-09-23 ENCOUNTER — Other Ambulatory Visit: Payer: Self-pay

## 2019-09-24 LAB — COMPLETE METABOLIC PANEL WITH GFR
AG Ratio: 1.6 (calc) (ref 1.0–2.5)
ALT: 11 U/L (ref 6–29)
AST: 18 U/L (ref 10–35)
Albumin: 4.3 g/dL (ref 3.6–5.1)
Alkaline phosphatase (APISO): 47 U/L (ref 37–153)
BUN/Creatinine Ratio: 30 (calc) — ABNORMAL HIGH (ref 6–22)
BUN: 29 mg/dL — ABNORMAL HIGH (ref 7–25)
CO2: 29 mmol/L (ref 20–32)
Calcium: 9.7 mg/dL (ref 8.6–10.4)
Chloride: 100 mmol/L (ref 98–110)
Creat: 0.96 mg/dL (ref 0.50–0.99)
GFR, Est African American: 72 mL/min/{1.73_m2} (ref >=60)
GFR, Est Non African American: 62 mL/min/{1.73_m2} (ref >=60)
Globulin: 2.7 g/dL (calc) (ref 1.9–3.7)
Glucose, Bld: 139 mg/dL — ABNORMAL HIGH (ref 65–99)
Potassium: 4.7 mmol/L (ref 3.5–5.3)
Sodium: 137 mmol/L (ref 135–146)
Total Bilirubin: 0.3 mg/dL (ref 0.2–1.2)
Total Protein: 7 g/dL (ref 6.1–8.1)

## 2019-09-24 LAB — LIPID PANEL
Cholesterol: 196 mg/dL (ref <200)
HDL: 54 mg/dL (ref >=50)
LDL Cholesterol (Calc): 116 mg/dL (calc) — ABNORMAL HIGH
Non-HDL Cholesterol (Calc): 142 mg/dL (calc) — ABNORMAL HIGH (ref <130)
Total CHOL/HDL Ratio: 3.6 (calc) (ref <5.0)
Triglycerides: 146 mg/dL (ref <150)

## 2019-09-24 LAB — CBC WITH DIFFERENTIAL/PLATELET
Absolute Monocytes: 333 cells/uL (ref 200–950)
Basophils Absolute: 31 cells/uL (ref 0–200)
Basophils Relative: 0.6 %
Eosinophils Absolute: 213 cells/uL (ref 15–500)
Eosinophils Relative: 4.1 %
HCT: 39.1 % (ref 35.0–45.0)
Hemoglobin: 13 g/dL (ref 11.7–15.5)
Lymphs Abs: 962 cells/uL (ref 850–3900)
MCH: 31.5 pg (ref 27.0–33.0)
MCHC: 33.2 g/dL (ref 32.0–36.0)
MCV: 94.7 fL (ref 80.0–100.0)
MPV: 11.1 fL (ref 7.5–12.5)
Monocytes Relative: 6.4 %
Neutro Abs: 3661 cells/uL (ref 1500–7800)
Neutrophils Relative %: 70.4 %
Platelets: 166 10*3/uL (ref 140–400)
RBC: 4.13 10*6/uL (ref 3.80–5.10)
RDW: 12.2 % (ref 11.0–15.0)
Total Lymphocyte: 18.5 %
WBC: 5.2 10*3/uL (ref 3.8–10.8)

## 2019-09-24 LAB — TEST AUTHORIZATION

## 2019-09-24 LAB — HEMOGLOBIN A1C W/OUT EAG: Hgb A1c MFr Bld: 5.4 % of total Hgb (ref <5.7)

## 2019-09-30 ENCOUNTER — Other Ambulatory Visit: Payer: Self-pay

## 2019-09-30 MED ORDER — DIAZEPAM 5 MG PO TABS: 5.0000 mg | ORAL_TABLET | Freq: Two times a day (BID) | ORAL | 0 refills | Status: DC | PRN

## 2019-09-30 NOTE — Telephone Encounter (Signed)
Requested Prescriptions   Pending Prescriptions Disp Refills  . diazepam (VALIUM) 5 MG tablet 60 tablet 0    Sig: Take 1 tablet (5 mg total) by mouth 2 (two) times daily as needed.     Last OV 09/22/2019   Last written 08/30/2019

## 2019-10-05 ENCOUNTER — Other Ambulatory Visit: Payer: Self-pay | Admitting: Family Medicine

## 2019-10-05 MED ORDER — ATORVASTATIN CALCIUM 40 MG PO TABS: 40.0000 mg | ORAL_TABLET | Freq: Every day | ORAL | 3 refills | Status: DC

## 2019-10-13 DIAGNOSIS — M1712 Unilateral primary osteoarthritis, left knee: Secondary | ICD-10-CM | POA: Diagnosis not present

## 2019-10-13 DIAGNOSIS — G894 Chronic pain syndrome: Secondary | ICD-10-CM | POA: Diagnosis not present

## 2019-10-13 DIAGNOSIS — M62830 Muscle spasm of back: Secondary | ICD-10-CM | POA: Diagnosis not present

## 2019-10-13 DIAGNOSIS — M47817 Spondylosis without myelopathy or radiculopathy, lumbosacral region: Secondary | ICD-10-CM | POA: Diagnosis not present

## 2019-10-13 DIAGNOSIS — M47812 Spondylosis without myelopathy or radiculopathy, cervical region: Secondary | ICD-10-CM | POA: Diagnosis not present

## 2019-11-14 ENCOUNTER — Other Ambulatory Visit: Payer: Self-pay | Admitting: Family Medicine

## 2019-11-14 DIAGNOSIS — M47817 Spondylosis without myelopathy or radiculopathy, lumbosacral region: Secondary | ICD-10-CM | POA: Diagnosis not present

## 2019-11-14 DIAGNOSIS — G894 Chronic pain syndrome: Secondary | ICD-10-CM | POA: Diagnosis not present

## 2019-11-14 DIAGNOSIS — Z79891 Long term (current) use of opiate analgesic: Secondary | ICD-10-CM | POA: Diagnosis not present

## 2019-11-14 DIAGNOSIS — M47812 Spondylosis without myelopathy or radiculopathy, cervical region: Secondary | ICD-10-CM | POA: Diagnosis not present

## 2019-11-14 DIAGNOSIS — M62830 Muscle spasm of back: Secondary | ICD-10-CM | POA: Diagnosis not present

## 2019-11-14 MED ORDER — DIAZEPAM 5 MG PO TABS: 5.0000 mg | ORAL_TABLET | Freq: Two times a day (BID) | ORAL | 2 refills | Status: DC | PRN

## 2019-11-14 NOTE — Telephone Encounter (Signed)
Patient needs refill on diazepam  [facility] [place]

## 2019-11-14 NOTE — Telephone Encounter (Signed)
Requesting refill   Valium   LOV:  09/22/2019  LRF:  09/30/2019

## 2019-11-28 DIAGNOSIS — M62830 Muscle spasm of back: Secondary | ICD-10-CM | POA: Diagnosis not present

## 2019-11-28 DIAGNOSIS — M47812 Spondylosis without myelopathy or radiculopathy, cervical region: Secondary | ICD-10-CM | POA: Diagnosis not present

## 2019-11-28 DIAGNOSIS — M47817 Spondylosis without myelopathy or radiculopathy, lumbosacral region: Secondary | ICD-10-CM | POA: Diagnosis not present

## 2019-11-28 DIAGNOSIS — G894 Chronic pain syndrome: Secondary | ICD-10-CM | POA: Diagnosis not present

## 2019-12-09 ENCOUNTER — Other Ambulatory Visit: Payer: Self-pay

## 2019-12-09 ENCOUNTER — Ambulatory Visit
Admission: RE | Admit: 2019-12-09 | Discharge: 2019-12-09 | Disposition: A | Discharge status: Home / Self Care | Payer: PPO | Source: Ambulatory Visit | Attending: Family Medicine | Admitting: Family Medicine

## 2019-12-09 DIAGNOSIS — M8589 Other specified disorders of bone density and structure, multiple sites: Secondary | ICD-10-CM | POA: Diagnosis not present

## 2019-12-09 DIAGNOSIS — Z780 Asymptomatic menopausal state: Secondary | ICD-10-CM | POA: Diagnosis not present

## 2019-12-09 DIAGNOSIS — M810 Age-related osteoporosis without current pathological fracture: Secondary | ICD-10-CM

## 2019-12-12 DIAGNOSIS — M47812 Spondylosis without myelopathy or radiculopathy, cervical region: Secondary | ICD-10-CM | POA: Diagnosis not present

## 2019-12-12 DIAGNOSIS — M47817 Spondylosis without myelopathy or radiculopathy, lumbosacral region: Secondary | ICD-10-CM | POA: Diagnosis not present

## 2019-12-12 DIAGNOSIS — G894 Chronic pain syndrome: Secondary | ICD-10-CM | POA: Diagnosis not present

## 2019-12-12 DIAGNOSIS — M62830 Muscle spasm of back: Secondary | ICD-10-CM | POA: Diagnosis not present

## 2019-12-21 ENCOUNTER — Other Ambulatory Visit: Payer: Self-pay | Admitting: Family Medicine

## 2019-12-21 MED ORDER — ALENDRONATE SODIUM 70 MG PO TABS
70.0000 mg | ORAL_TABLET | ORAL | 11 refills | Status: DC
Start: 2019-12-21 — End: 2020-10-04

## 2019-12-26 ENCOUNTER — Ambulatory Visit (HOSPITAL_COMMUNITY): Payer: Medicare Other

## 2019-12-26 ENCOUNTER — Other Ambulatory Visit: Payer: Self-pay

## 2019-12-26 ENCOUNTER — Ambulatory Visit (INDEPENDENT_AMBULATORY_CARE_PROVIDER_SITE_OTHER): Payer: Medicare Other | Admitting: Nurse Practitioner

## 2019-12-26 VITALS — BP 96/58 | HR 61 | Temp 96.8°F | Resp 18 | Wt 151.4 lb

## 2019-12-26 DIAGNOSIS — R519 Headache, unspecified: Secondary | ICD-10-CM

## 2019-12-26 DIAGNOSIS — Z8669 Personal history of other diseases of the nervous system and sense organs: Secondary | ICD-10-CM

## 2019-12-26 DIAGNOSIS — G43911 Migraine, unspecified, intractable, with status migrainosus: Secondary | ICD-10-CM

## 2019-12-26 NOTE — Progress Notes (Signed)
Established Patient Office Visit  Subjective:  Patient ID: Julie Neal, female    DOB: Aug 21, 1955  Age: 64 y.o. MRN: [id number]  CC:  Chief Complaint  Patient presents with  . Migraine    for 3 days, woke up it, meds taken from pain management    HPI Julie Neal is a 64 year old female presenting with c/o headache for three days. Her headache started on Friday evening. The headache woke her up. It began suddenly. It is worse than she has had before. She does report having h/o migraine and fibromyalgia. She reports taking her usual migraine tylenol, hydrocodone without relief. With her age and subjective examination CT stat recommended and available at [facility]. The pt refused stating that she does not like [facility] as she has had a misdiagnosed xray prior years there. Discussed risk vs benefit of not completing. Offered ER vs apt at 0830 in AM with [facility] for CT. She declined ER stating she would wait until AM. Pt v/l understanding risk of waiting with chaperone/witness in room.    Past Medical History:  Diagnosis Date  . Anxiety   . Asymptomatic stenosis of right carotid artery    1-30%  . Cervicalgia   . Depression   . Fibromyalgia   . Fracture of ankle, bimalleolar, left, closed 08/10/2014  . Helicobacter pylori gastritis 02-Nov-2016  . Hyperlipidemia   . Lumbago   . Memory loss   . Migraines   . Neuralgic migraines   . Osteoporosis     Past Surgical History:  Procedure Laterality Date  . ABDOMINAL HYSTERECTOMY    . BACK SURGERY    . HAND SURGERY Bilateral   . LEG SURGERY Bilateral   . NECK SURGERY    . SHOULDER SURGERY Left     Family History  Problem Relation Age of Onset  . Heart attack Father   . Heart disease Father 24  . Alzheimer's disease Mother   . Stroke Mother   . Diabetes type II Brother   . Breast cancer Sister     Social History   Socioeconomic History  . Marital status: Widowed    Spouse name: [name]  . Number of children: 1   . Years of education: college  . Highest education level: Not on file  Occupational History    Comment: disabled  Tobacco Use  . Smoking status: Former Smoker    Types: Cigarettes  . Smokeless tobacco: Never Used  . Tobacco comment: Quit in 1986-11-03  Substance and Sexual Activity  . Alcohol use: No    Alcohol/week: 0.0 standard drinks  . Drug use: No  . Sexual activity: Not on file  Other Topics Concern  . Not on file  Social History Narrative   Has a college education. Husband died 03-Nov-2019 (pulmonary fibrosis).  Patient has one child.   Patient is disabled. Right handed.   Caffeine one cup of coffee.   Social Determinants of Health   Financial Resource Strain:   . Difficulty of Paying Living Expenses:   Food Insecurity:   . Worried About Charity fundraiser in the Last Year:   . Arboriculturist in the Last Year:   Transportation Needs:   . Film/video editor (Medical):   Marland Kitchen Lack of Transportation (Non-Medical):   Physical Activity:   . Days of Exercise per Week:   . Minutes of Exercise per Session:   Stress:   . Feeling of Stress :  Social Connections:   . Frequency of Communication with Friends and Family:   . Frequency of Social Gatherings with Friends and Family:   . Attends Religious Services:   . Active Member of Clubs or Organizations:   . Attends Archivist Meetings:   Marland Kitchen Marital Status:   Intimate Partner Violence:   . Fear of Current or Ex-Partner:   . Emotionally Abused:   Marland Kitchen Physically Abused:   . Sexually Abused:     Outpatient Medications Prior to Visit  Medication Sig Dispense Refill  . alendronate (FOSAMAX) 70 MG tablet Take 1 tablet (70 mg total) by mouth every 7 (seven) days. Take with a full glass of water on an empty stomach. 4 tablet 11  . atorvastatin (LIPITOR) 40 MG tablet Take 1 tablet (40 mg total) by mouth daily. 90 tablet 3  . buPROPion (WELLBUTRIN XL) 300 MG 24 hr tablet Take 1 tablet (300 mg total) by mouth daily. 90 tablet 1   . diazepam (VALIUM) 5 MG tablet Take 1 tablet (5 mg total) by mouth 2 (two) times daily as needed. 60 tablet 2  . DULoxetine (CYMBALTA) 30 MG capsule TAKE 1 CAPSULE BY MOUTH EVERYDAY AT BEDTIME  1  . oxyCODONE-acetaminophen (PERCOCET/ROXICET) 5-325 MG tablet Take 1 tablet by mouth 2 (two) times daily.    Marland Kitchen tiZANidine (ZANAFLEX) 4 MG tablet TAKE 1 TABLET BY MOUTH 4 TIMES A DAY AS NEEDED  0  . traZODone (DESYREL) 100 MG tablet TAKE 2 TABLETS (200MG ) BY MOUTH EVERY EVENING AT BEDTIME 180 tablet 1  . alendronate (FOSAMAX) 70 MG tablet Take 1 tablet (70 mg total) by mouth once a week. TAKE 1 TABLET BY MOUTH EVERY 7 DAYS WITH FULL GLASS OF WATER ON EMPTY STOMACH 12 tablet 3   No facility-administered medications prior to visit.    Allergies  Allergen Reactions  . Mobic [Meloxicam] Anaphylaxis  . Penicillins Anaphylaxis  . Nubain [Nalbuphine Hcl] Diarrhea and Nausea And Vomiting    ROS Review of Systems  All other systems reviewed and are negative.     Objective:    Physical Exam  Constitutional: She is oriented to person, place, and time. Vital signs are normal. She appears well-developed and well-nourished.  HENT:  Head: Normocephalic.  Right Ear: Hearing and ear canal normal.  Left Ear: Hearing and ear canal normal.  Eyes: Pupils are equal, round, and reactive to light. Conjunctivae, EOM and lids are normal. Lids are everted and swept, no foreign bodies found.  Neck: No JVD present. Carotid bruit is not present. No thyromegaly present.  Cardiovascular: Normal rate, S1 normal, S2 normal and normal heart sounds.  Pulmonary/Chest: Effort normal.  Abdominal: Soft. Normal appearance.  Musculoskeletal:        General: Normal range of motion.     Cervical back: Normal range of motion and neck supple.  Lymphadenopathy:    She has no cervical adenopathy.  Neurological: She is alert and oriented to person, place, and time.  Skin: Skin is warm and dry.  Psychiatric: She has a normal mood  and affect. Her speech is normal and behavior is normal. Judgment and thought content normal. Cognition and memory are normal.  Nursing note and vitals reviewed.   BP (!) 96/58 (BP Location: Left Arm, Patient Position: Sitting, Cuff Size: Normal)   Pulse 61   Temp (!) 96.8 F (36 C) (Temporal)   Resp 18   Wt 151 lb 6.4 oz (68.7 kg)   SpO2 97%   BMI  24.44 kg/m  Wt Readings from Last 3 Encounters:  12/26/19 151 lb 6.4 oz (68.7 kg)  09/22/19 146 lb (66.2 kg)  09/01/18 149 lb 6 oz (67.8 kg)     Health Maintenance Due  Topic Date Due  . COVID-19 Vaccine (1) Never done  . PAP SMEAR-Modifier  07/18/2017  . MAMMOGRAM  11/19/2019    There are no preventive care reminders to display for this patient.  Lab Results  Component Value Date   TSH 0.94 06/01/2017   Lab Results  Component Value Date   WBC 5.2 09/22/2019   HGB 13.0 09/22/2019   HCT 39.1 09/22/2019   MCV 94.7 09/22/2019   PLT 166 09/22/2019   Lab Results  Component Value Date   NA 137 09/22/2019   K 4.7 09/22/2019   CO2 29 09/22/2019   GLUCOSE 139 (H) 09/22/2019   BUN 29 (H) 09/22/2019   CREATININE 0.96 09/22/2019   BILITOT 0.3 09/22/2019   ALKPHOS 43 10/20/2016   AST 18 09/22/2019   ALT 11 09/22/2019   PROT 7.0 09/22/2019   ALBUMIN 4.7 10/20/2016   CALCIUM 9.7 09/22/2019   Lab Results  Component Value Date   CHOL 196 09/22/2019   Lab Results  Component Value Date   HDL 54 09/22/2019   Lab Results  Component Value Date   LDLCALC 116 (H) 09/22/2019   Lab Results  Component Value Date   TRIG 146 09/22/2019   Lab Results  Component Value Date   CHOLHDL 3.6 09/22/2019   Lab Results  Component Value Date   HGBA1C 5.4 09/22/2019      Assessment & Plan:  You should complete ct today or go to er. Risk of Headache and hemorraghic bleed education provided. Related to [address] 0830 apt for CT at [facility] apt in AM for CT.   Problem List Items Addressed This Visit      Cardiovascular and  Mediastinum   Migraines   Relevant Orders   CT Head Wo Contrast     Other   H/O migraine - Primary   Relevant Orders   CT Head Wo Contrast    Other Visit Diagnoses    Acute intractable headache, unspecified headache type       Relevant Orders   CT Head W Contrast   CT Head Wo Contrast   Nonintractable headache, unspecified chronicity pattern, unspecified headache type         Follow-up: Return if symptoms worsen or fail to improve.    Annie Main, FNP

## 2019-12-26 NOTE — Patient Instructions (Signed)
Recommend ER or stat CT at [facility]. May obtain [place] CT in AM.

## 2019-12-27 ENCOUNTER — Other Ambulatory Visit: Payer: Self-pay | Admitting: Nurse Practitioner

## 2019-12-27 ENCOUNTER — Telehealth: Payer: Self-pay | Admitting: Family Medicine

## 2019-12-27 ENCOUNTER — Ambulatory Visit (HOSPITAL_COMMUNITY)
Admission: RE | Admit: 2019-12-27 | Discharge: 2019-12-27 | Disposition: A | Discharge status: Home / Self Care | Payer: Medicare Other | Source: Ambulatory Visit | Attending: Nurse Practitioner | Admitting: Nurse Practitioner

## 2019-12-27 ENCOUNTER — Encounter (HOSPITAL_COMMUNITY): Payer: Self-pay

## 2019-12-27 ENCOUNTER — Ambulatory Visit (INDEPENDENT_AMBULATORY_CARE_PROVIDER_SITE_OTHER): Payer: Medicare Other

## 2019-12-27 DIAGNOSIS — R519 Headache, unspecified: Secondary | ICD-10-CM | POA: Insufficient documentation

## 2019-12-27 DIAGNOSIS — Z8669 Personal history of other diseases of the nervous system and sense organs: Secondary | ICD-10-CM | POA: Diagnosis not present

## 2019-12-27 DIAGNOSIS — G43911 Migraine, unspecified, intractable, with status migrainosus: Secondary | ICD-10-CM

## 2019-12-27 MED ORDER — TOPIRAMATE 50 MG PO TABS: ORAL_TABLET | ORAL | 0 refills | Status: DC

## 2019-12-27 MED ORDER — KETOROLAC TROMETHAMINE 30 MG/ML IJ SOLN
30.0000 mg | Freq: Once | INTRAMUSCULAR | Status: AC
  Administered 2019-12-27: 30 mg via INTRAMUSCULAR

## 2019-12-27 NOTE — Progress Notes (Signed)
No abnormality seen to explain headache.   I will start Topamax as we discussed in clinic yesterday. Please have the pt follow up one week prior to running out of the Topamax.

## 2019-12-27 NOTE — Progress Notes (Signed)
Patient came in today for an injection of toradol per Ishmael Holter NP. Patient was give the injection at 2:27 PM at that time patient rates her headache at a 10. Patient observed for in clinic for 10 minutes stated that her headache went down to a 6. Patient ok'd to go home and schedule follow up visit in 1 month per provider.

## 2019-12-27 NOTE — Telephone Encounter (Signed)
Cb# [phone] Call for CT results

## 2020-01-10 DIAGNOSIS — M62830 Muscle spasm of back: Secondary | ICD-10-CM | POA: Diagnosis not present

## 2020-01-10 DIAGNOSIS — M7062 Trochanteric bursitis, left hip: Secondary | ICD-10-CM | POA: Diagnosis not present

## 2020-01-10 DIAGNOSIS — G894 Chronic pain syndrome: Secondary | ICD-10-CM | POA: Diagnosis not present

## 2020-01-10 DIAGNOSIS — M47812 Spondylosis without myelopathy or radiculopathy, cervical region: Secondary | ICD-10-CM | POA: Diagnosis not present

## 2020-01-10 DIAGNOSIS — M47817 Spondylosis without myelopathy or radiculopathy, lumbosacral region: Secondary | ICD-10-CM | POA: Diagnosis not present

## 2020-01-17 ENCOUNTER — Other Ambulatory Visit: Payer: Self-pay | Admitting: Family Medicine

## 2020-01-17 DIAGNOSIS — F330 Major depressive disorder, recurrent, mild: Secondary | ICD-10-CM

## 2020-01-17 MED ORDER — TRAZODONE HCL 100 MG PO TABS: ORAL_TABLET | ORAL | 1 refills | Status: DC

## 2020-01-17 NOTE — Telephone Encounter (Signed)
Last office visit: 12/26/2019 Last refilled: 05/19/2019

## 2020-01-19 ENCOUNTER — Other Ambulatory Visit: Payer: Self-pay

## 2020-01-19 DIAGNOSIS — F330 Major depressive disorder, recurrent, mild: Secondary | ICD-10-CM

## 2020-01-19 MED ORDER — TRAZODONE HCL 100 MG PO TABS: ORAL_TABLET | ORAL | 1 refills | Status: DC

## 2020-01-19 NOTE — Telephone Encounter (Signed)
Refilled K. Buelah Manis, MD

## 2020-01-23 ENCOUNTER — Ambulatory Visit (INDEPENDENT_AMBULATORY_CARE_PROVIDER_SITE_OTHER): Payer: Medicare Other | Admitting: Nurse Practitioner

## 2020-01-23 DIAGNOSIS — Z5329 Procedure and treatment not carried out because of patient's decision for other reasons: Secondary | ICD-10-CM

## 2020-02-07 DIAGNOSIS — M47817 Spondylosis without myelopathy or radiculopathy, lumbosacral region: Secondary | ICD-10-CM | POA: Diagnosis not present

## 2020-02-07 DIAGNOSIS — G894 Chronic pain syndrome: Secondary | ICD-10-CM | POA: Diagnosis not present

## 2020-02-07 DIAGNOSIS — M62830 Muscle spasm of back: Secondary | ICD-10-CM | POA: Diagnosis not present

## 2020-02-07 DIAGNOSIS — M47812 Spondylosis without myelopathy or radiculopathy, cervical region: Secondary | ICD-10-CM | POA: Diagnosis not present

## 2020-02-16 DIAGNOSIS — M2170 Unequal limb length (acquired), unspecified site: Secondary | ICD-10-CM | POA: Insufficient documentation

## 2020-02-16 DIAGNOSIS — M419 Scoliosis, unspecified: Secondary | ICD-10-CM | POA: Insufficient documentation

## 2020-02-16 DIAGNOSIS — M25552 Pain in left hip: Secondary | ICD-10-CM | POA: Diagnosis not present

## 2020-03-07 ENCOUNTER — Other Ambulatory Visit: Payer: Self-pay | Admitting: Family Medicine

## 2020-03-07 DIAGNOSIS — G894 Chronic pain syndrome: Secondary | ICD-10-CM | POA: Diagnosis not present

## 2020-03-07 DIAGNOSIS — M62830 Muscle spasm of back: Secondary | ICD-10-CM | POA: Diagnosis not present

## 2020-03-07 DIAGNOSIS — M47812 Spondylosis without myelopathy or radiculopathy, cervical region: Secondary | ICD-10-CM | POA: Diagnosis not present

## 2020-03-07 DIAGNOSIS — M47817 Spondylosis without myelopathy or radiculopathy, lumbosacral region: Secondary | ICD-10-CM | POA: Diagnosis not present

## 2020-03-07 DIAGNOSIS — F330 Major depressive disorder, recurrent, mild: Secondary | ICD-10-CM

## 2020-03-20 ENCOUNTER — Encounter: Payer: Self-pay | Admitting: Family Medicine

## 2020-04-06 DIAGNOSIS — G894 Chronic pain syndrome: Secondary | ICD-10-CM | POA: Diagnosis not present

## 2020-04-06 DIAGNOSIS — M62830 Muscle spasm of back: Secondary | ICD-10-CM | POA: Diagnosis not present

## 2020-04-06 DIAGNOSIS — M47817 Spondylosis without myelopathy or radiculopathy, lumbosacral region: Secondary | ICD-10-CM | POA: Diagnosis not present

## 2020-04-06 DIAGNOSIS — M47812 Spondylosis without myelopathy or radiculopathy, cervical region: Secondary | ICD-10-CM | POA: Diagnosis not present

## 2020-04-11 ENCOUNTER — Other Ambulatory Visit: Payer: Self-pay | Admitting: Family Medicine

## 2020-04-11 NOTE — Telephone Encounter (Signed)
Ok to refill??  Last office visit 09/22/2019.  Last refill 11/14/2019, #2 refills.

## 2020-05-03 ENCOUNTER — Other Ambulatory Visit: Payer: Self-pay | Admitting: Specialist

## 2020-05-03 DIAGNOSIS — M25552 Pain in left hip: Secondary | ICD-10-CM

## 2020-05-07 DIAGNOSIS — M47812 Spondylosis without myelopathy or radiculopathy, cervical region: Secondary | ICD-10-CM | POA: Diagnosis not present

## 2020-05-07 DIAGNOSIS — M62830 Muscle spasm of back: Secondary | ICD-10-CM | POA: Diagnosis not present

## 2020-05-07 DIAGNOSIS — G894 Chronic pain syndrome: Secondary | ICD-10-CM | POA: Diagnosis not present

## 2020-05-07 DIAGNOSIS — M47817 Spondylosis without myelopathy or radiculopathy, lumbosacral region: Secondary | ICD-10-CM | POA: Diagnosis not present

## 2020-05-15 ENCOUNTER — Other Ambulatory Visit: Payer: Self-pay

## 2020-05-15 ENCOUNTER — Ambulatory Visit
Admission: RE | Admit: 2020-05-15 | Discharge: 2020-05-15 | Disposition: A | Discharge status: Home / Self Care | Payer: Medicare Other | Source: Ambulatory Visit | Attending: Specialist | Admitting: Specialist

## 2020-05-15 DIAGNOSIS — R262 Difficulty in walking, not elsewhere classified: Secondary | ICD-10-CM | POA: Diagnosis not present

## 2020-05-15 DIAGNOSIS — M25552 Pain in left hip: Secondary | ICD-10-CM

## 2020-05-15 DIAGNOSIS — M1612 Unilateral primary osteoarthritis, left hip: Secondary | ICD-10-CM | POA: Diagnosis not present

## 2020-06-04 DIAGNOSIS — M47817 Spondylosis without myelopathy or radiculopathy, lumbosacral region: Secondary | ICD-10-CM | POA: Diagnosis not present

## 2020-06-04 DIAGNOSIS — G894 Chronic pain syndrome: Secondary | ICD-10-CM | POA: Diagnosis not present

## 2020-06-04 DIAGNOSIS — M47812 Spondylosis without myelopathy or radiculopathy, cervical region: Secondary | ICD-10-CM | POA: Diagnosis not present

## 2020-06-04 DIAGNOSIS — M62830 Muscle spasm of back: Secondary | ICD-10-CM | POA: Diagnosis not present

## 2020-06-11 ENCOUNTER — Other Ambulatory Visit: Payer: Self-pay | Admitting: Orthopedic Surgery

## 2020-06-11 ENCOUNTER — Other Ambulatory Visit: Payer: Self-pay | Admitting: Family Medicine

## 2020-06-11 DIAGNOSIS — F330 Major depressive disorder, recurrent, mild: Secondary | ICD-10-CM

## 2020-06-11 DIAGNOSIS — M25552 Pain in left hip: Secondary | ICD-10-CM | POA: Diagnosis not present

## 2020-06-11 DIAGNOSIS — M5417 Radiculopathy, lumbosacral region: Secondary | ICD-10-CM

## 2020-06-11 NOTE — Telephone Encounter (Signed)
Please Advise

## 2020-06-25 ENCOUNTER — Other Ambulatory Visit: Payer: Self-pay

## 2020-06-26 ENCOUNTER — Encounter: Payer: Self-pay | Admitting: Nurse Practitioner

## 2020-06-26 ENCOUNTER — Ambulatory Visit (INDEPENDENT_AMBULATORY_CARE_PROVIDER_SITE_OTHER): Payer: Medicare Other | Admitting: Nurse Practitioner

## 2020-06-26 ENCOUNTER — Ambulatory Visit
Admission: RE | Admit: 2020-06-26 | Discharge: 2020-06-26 | Disposition: A | Discharge status: Home / Self Care | Payer: Medicare Other | Source: Ambulatory Visit | Attending: Orthopedic Surgery | Admitting: Orthopedic Surgery

## 2020-06-26 VITALS — BP 114/70 | HR 89 | Temp 96.7°F | Ht 66.0 in | Wt 162.6 lb

## 2020-06-26 DIAGNOSIS — Z23 Encounter for immunization: Secondary | ICD-10-CM | POA: Diagnosis not present

## 2020-06-26 DIAGNOSIS — F331 Major depressive disorder, recurrent, moderate: Secondary | ICD-10-CM

## 2020-06-26 DIAGNOSIS — G43709 Chronic migraine without aura, not intractable, without status migrainosus: Secondary | ICD-10-CM | POA: Diagnosis not present

## 2020-06-26 DIAGNOSIS — E7800 Pure hypercholesterolemia, unspecified: Secondary | ICD-10-CM | POA: Diagnosis not present

## 2020-06-26 DIAGNOSIS — Z79899 Other long term (current) drug therapy: Secondary | ICD-10-CM | POA: Diagnosis not present

## 2020-06-26 DIAGNOSIS — M5417 Radiculopathy, lumbosacral region: Secondary | ICD-10-CM

## 2020-06-26 DIAGNOSIS — M545 Low back pain, unspecified: Secondary | ICD-10-CM | POA: Diagnosis not present

## 2020-06-26 LAB — BASIC METABOLIC PANEL
BUN: 21 mg/dL (ref 6–23)
CO2: 33 mEq/L — ABNORMAL HIGH (ref 19–32)
Calcium: 10 mg/dL (ref 8.4–10.5)
Chloride: 99 mEq/L (ref 96–112)
Creatinine, Ser: 1.1 mg/dL (ref 0.40–1.20)
GFR: 52.91 mL/min — ABNORMAL LOW (ref >60.00)
Glucose, Bld: 85 mg/dL (ref 70–99)
Potassium: 4.2 mEq/L (ref 3.5–5.1)
Sodium: 140 mEq/L (ref 135–145)

## 2020-06-26 LAB — LIPID PANEL
Cholesterol: 166 mg/dL (ref 0–200)
HDL: 55 mg/dL (ref >39.00)
LDL Cholesterol: 79 mg/dL (ref 0–99)
NonHDL: 110.71
Total CHOL/HDL Ratio: 3
Triglycerides: 158 mg/dL — ABNORMAL HIGH (ref 0.0–149.0)
VLDL: 31.6 mg/dL (ref 0.0–40.0)

## 2020-06-26 MED ORDER — SHINGRIX 50 MCG/0.5ML IM SUSR: 0.5000 mL | Freq: Once | INTRAMUSCULAR | 0 refills | Status: AC

## 2020-06-26 NOTE — Progress Notes (Signed)
Subjective:  Patient ID: Julie Neal, female    DOB: 12/28/1955  Age: 64 y.o. MRN: [id number]  CC: Establish Care (transfer care (depression and cholesterol f/up))  HPI Transfer from Dr. Dennard Schaumann.  Depression Stable mood with wellbutrin, trazodone and cymbalta. Use of valium BID to control esophageal spasm at meal time per patient. Also under care of pain clinic with percocet rx. She is aware of the risk of sedation when combining benzodiazepines and opiates.  Narcan rx provided Maintain current medications F/up in 3-32months     Migraines Chronic, no migraine in 1year so has not need topamax. No medication at this time  Hyperlipidemia Current use of atorvastatin right carotid stenosis: 1% to 39% ICA stenosis per duplex completed 2016. Repeat lipid panel  Depression screen [facility] 2/9 06/26/2020 06/01/2017 03/26/2017  Decreased Interest 2 0 0  Down, Depressed, Hopeless 1 0 0  PHQ - 2 Score 3 0 0  Altered sleeping 1 0 0  Tired, decreased energy 2 0 0  Change in appetite 0 0 1  Feeling bad or failure about yourself  0 1 0  Trouble concentrating 1 0 0  Moving slowly or fidgety/restless 0 0 0  Suicidal thoughts 0 0 0  PHQ-9 Score 7 1 1   Difficult doing work/chores Not difficult at all Not difficult at all -   GAD 7 : Generalized Anxiety Score 06/26/2020  Nervous, Anxious, on Edge 0  Control/stop worrying 0  Worry too much - different things 0  Trouble relaxing 1  Restless 0  Easily annoyed or irritable 1  Afraid - awful might happen 0  Total GAD 7 Score 2  Anxiety Difficulty Not difficult at all   Reviewed past Medical, Social and Family history today.  Outpatient Medications Prior to Visit  Medication Sig Dispense Refill   alendronate (FOSAMAX) 70 MG tablet Take 1 tablet (70 mg total) by mouth every 7 (seven) days. Take with a full glass of water on an empty stomach. 4 tablet 11   atorvastatin (LIPITOR) 40 MG tablet Take 1 tablet (40 mg total) by mouth daily.  90 tablet 3   buPROPion (WELLBUTRIN XL) 300 MG 24 hr tablet Take 1 tablet by mouth once daily 90 tablet 0   diazepam (VALIUM) 5 MG tablet Take 1 tablet by mouth twice daily as needed 60 tablet 2   DULoxetine (CYMBALTA) 30 MG capsule TAKE 1 CAPSULE BY MOUTH EVERYDAY AT BEDTIME  1   oxyCODONE-acetaminophen (PERCOCET/ROXICET) 5-325 MG tablet Take 1 tablet by mouth 2 (two) times daily.     tiZANidine (ZANAFLEX) 4 MG tablet TAKE 1 TABLET BY MOUTH 4 TIMES A DAY AS NEEDED  0   traZODone (DESYREL) 100 MG tablet TAKE 2 TABLETS (200MG ) BY MOUTH EVERY EVENING AT BEDTIME 180 tablet 1   topiramate (TOPAMAX) 50 MG tablet Take 1/2 (half tablet) by mouth daily  For 14 days, then increase to 1 tablet daily. 23 tablet 0   No facility-administered medications prior to visit.    ROS See HPI  Objective:  BP 114/70 (BP Location: Left Arm, Patient Position: Sitting, Cuff Size: Normal)    Pulse 89    Temp (!) 96.7 F (35.9 C) (Temporal)    Ht 5\' 6"  (1.676 m)    Wt 162 lb 9.6 oz (73.8 kg)    SpO2 95%    BMI 26.24 kg/m   Physical Exam Vitals reviewed.  Cardiovascular:     Rate and Rhythm: Normal rate and regular rhythm.  Pulses: Normal pulses.     Heart sounds: Normal heart sounds.  Pulmonary:     Effort: Pulmonary effort is normal.     Breath sounds: Normal breath sounds.  Musculoskeletal:     Cervical back: Normal range of motion and neck supple.     Right lower leg: No edema.     Left lower leg: No edema.  Neurological:     Mental Status: She is alert and oriented to person, place, and time.    Assessment & Plan:  This visit occurred during the SARS-CoV-2 public health emergency.  Safety protocols were in place, including screening questions prior to the visit, additional usage of staff PPE, and extensive cleaning of exam room while observing appropriate contact time as indicated for disinfecting solutions.   Julie Neal was seen today for establish care.  Diagnoses and all orders for this  visit:  Moderate episode of recurrent major depressive disorder ([place]) -     Basic metabolic panel  Influenza vaccine needed -     Flu Vaccine QUAD High Dose(Fluad)  Pure hypercholesterolemia -     Lipid panel -     Basic metabolic panel  Need for shingles vaccine -     Zoster Vaccine Adjuvanted [employer]) injection; Inject 0.5 mLs into the muscle once for 1 dose.  Chronic migraine without aura without status migrainosus, not intractable   Problem List Items Addressed This Visit      Cardiovascular and Mediastinum   Migraines    Chronic, no migraine in 1year so has not need topamax. No medication at this time        Other   Depression - Primary    Stable mood with wellbutrin, trazodone and cymbalta. Use of valium BID to control esophageal spasm at meal time per patient. Also under care of pain clinic with percocet rx. She is aware of the risk of sedation when combining benzodiazepines and opiates.  Narcan rx provided Maintain current medications F/up in 3-8months         Relevant Orders   Basic metabolic panel (Completed)   Hyperlipidemia    Current use of atorvastatin right carotid stenosis: 1% to 39% ICA stenosis per duplex completed 2016. Repeat lipid panel      Relevant Orders   Lipid panel (Completed)   Basic metabolic panel (Completed)    Other Visit Diagnoses    Influenza vaccine needed       Relevant Orders   Flu Vaccine QUAD High Dose(Fluad) (Completed)   Need for shingles vaccine          Follow-up: Return in about 6 months (around 12/24/2020) for hyperlipidimemia and depression (33mins, fasting).  Wilfred Lacy, NP

## 2020-06-26 NOTE — Patient Instructions (Signed)
Go to lab for blood draw Decrease valium dose: 5mg  in Am only

## 2020-07-01 ENCOUNTER — Encounter: Payer: Self-pay | Admitting: Nurse Practitioner

## 2020-07-01 ENCOUNTER — Telehealth: Payer: Self-pay | Admitting: Nurse Practitioner

## 2020-07-01 MED ORDER — NALOXONE HCL 0.4 MG/ML IJ SOLN: 0.4000 mg | INTRAMUSCULAR | 1 refills | Status: DC | PRN

## 2020-07-01 NOTE — Assessment & Plan Note (Signed)
Stable mood with wellbutrin, trazodone and cymbalta. Use of valium BID to control esophageal spasm at meal time per patient. Also under care of pain clinic with percocet rx. She is aware of the risk of sedation when combining benzodiazepines and opiates.  Narcan rx provided Maintain current medications F/up in 3-39months

## 2020-07-01 NOTE — Telephone Encounter (Signed)
Please inform Julie Neal that narcan rx was sent to her pharmacy. With current use of valium and oxycodone, she is at risk of overdose and oversedation. Narcan is to reserve effects of oversedation or overdose. Inform your family about the rx and how to use it if needed.

## 2020-07-01 NOTE — Assessment & Plan Note (Addendum)
Current use of atorvastatin right carotid stenosis: 1% to 39% ICA stenosis per duplex completed 2016. Repeat lipid panel

## 2020-07-01 NOTE — Assessment & Plan Note (Signed)
Chronic, no migraine in 1year so has not need topamax. No medication at this time

## 2020-07-02 DIAGNOSIS — M419 Scoliosis, unspecified: Secondary | ICD-10-CM | POA: Diagnosis not present

## 2020-07-02 DIAGNOSIS — M259 Joint disorder, unspecified: Secondary | ICD-10-CM | POA: Diagnosis not present

## 2020-07-02 DIAGNOSIS — M545 Low back pain, unspecified: Secondary | ICD-10-CM | POA: Diagnosis not present

## 2020-07-02 NOTE — Telephone Encounter (Signed)
Pt notified and verbalized understanding.

## 2020-07-03 DIAGNOSIS — M47817 Spondylosis without myelopathy or radiculopathy, lumbosacral region: Secondary | ICD-10-CM | POA: Diagnosis not present

## 2020-07-03 DIAGNOSIS — Z79891 Long term (current) use of opiate analgesic: Secondary | ICD-10-CM | POA: Diagnosis not present

## 2020-07-03 DIAGNOSIS — M47812 Spondylosis without myelopathy or radiculopathy, cervical region: Secondary | ICD-10-CM | POA: Diagnosis not present

## 2020-07-03 DIAGNOSIS — M62830 Muscle spasm of back: Secondary | ICD-10-CM | POA: Diagnosis not present

## 2020-07-03 DIAGNOSIS — G894 Chronic pain syndrome: Secondary | ICD-10-CM | POA: Diagnosis not present

## 2020-07-04 ENCOUNTER — Other Ambulatory Visit: Payer: Self-pay | Admitting: Orthopedic Surgery

## 2020-07-04 DIAGNOSIS — M533 Sacrococcygeal disorders, not elsewhere classified: Secondary | ICD-10-CM

## 2020-07-12 ENCOUNTER — Other Ambulatory Visit: Payer: Self-pay | Admitting: Family Medicine

## 2020-07-12 DIAGNOSIS — F330 Major depressive disorder, recurrent, mild: Secondary | ICD-10-CM

## 2020-07-23 ENCOUNTER — Telehealth: Payer: Self-pay | Admitting: Nurse Practitioner

## 2020-07-23 NOTE — Telephone Encounter (Signed)
  Who's calling (name and relationship to patient) :Lynita Groseclose  Best contact number: [phone] Provider they see: Wilfred Lacy Reason for call: Patient stated her eye was swollen and sore.  She thinks its the pink eye and would like for Dr. Lorayne Marek to call her.    PRESCRIPTION REFILL ONLY  Name of prescription:  Pharmacy:

## 2020-07-25 ENCOUNTER — Ambulatory Visit
Admission: RE | Admit: 2020-07-25 | Discharge: 2020-07-25 | Disposition: A | Discharge status: Home / Self Care | Payer: Medicare Other | Source: Ambulatory Visit | Attending: Orthopedic Surgery | Admitting: Orthopedic Surgery

## 2020-07-25 ENCOUNTER — Other Ambulatory Visit: Payer: Self-pay

## 2020-07-25 DIAGNOSIS — M533 Sacrococcygeal disorders, not elsewhere classified: Secondary | ICD-10-CM

## 2020-07-31 DIAGNOSIS — G894 Chronic pain syndrome: Secondary | ICD-10-CM | POA: Diagnosis not present

## 2020-07-31 DIAGNOSIS — M62830 Muscle spasm of back: Secondary | ICD-10-CM | POA: Diagnosis not present

## 2020-07-31 DIAGNOSIS — M47817 Spondylosis without myelopathy or radiculopathy, lumbosacral region: Secondary | ICD-10-CM | POA: Diagnosis not present

## 2020-07-31 DIAGNOSIS — M47812 Spondylosis without myelopathy or radiculopathy, cervical region: Secondary | ICD-10-CM | POA: Diagnosis not present

## 2020-08-07 ENCOUNTER — Ambulatory Visit: Payer: Medicare Other

## 2020-09-04 ENCOUNTER — Ambulatory Visit: Payer: Medicare Other

## 2020-09-06 ENCOUNTER — Encounter: Payer: Self-pay | Admitting: Internal Medicine

## 2020-09-12 DIAGNOSIS — M47817 Spondylosis without myelopathy or radiculopathy, lumbosacral region: Secondary | ICD-10-CM | POA: Diagnosis not present

## 2020-09-12 DIAGNOSIS — M62830 Muscle spasm of back: Secondary | ICD-10-CM | POA: Diagnosis not present

## 2020-09-12 DIAGNOSIS — M47812 Spondylosis without myelopathy or radiculopathy, cervical region: Secondary | ICD-10-CM | POA: Diagnosis not present

## 2020-09-12 DIAGNOSIS — G894 Chronic pain syndrome: Secondary | ICD-10-CM | POA: Diagnosis not present

## 2020-09-13 ENCOUNTER — Other Ambulatory Visit: Payer: Self-pay | Admitting: Nurse Practitioner

## 2020-09-13 DIAGNOSIS — F330 Major depressive disorder, recurrent, mild: Secondary | ICD-10-CM

## 2020-09-13 DIAGNOSIS — E785 Hyperlipidemia, unspecified: Secondary | ICD-10-CM

## 2020-09-13 MED ORDER — DIAZEPAM 5 MG PO TABS: 5.0000 mg | ORAL_TABLET | Freq: Two times a day (BID) | ORAL | 2 refills | Status: DC | PRN

## 2020-09-13 MED ORDER — ATORVASTATIN CALCIUM 40 MG PO TABS: 40.0000 mg | ORAL_TABLET | Freq: Every day | ORAL | 3 refills | Status: DC

## 2020-09-13 MED ORDER — BUPROPION HCL ER (XL) 300 MG PO TB24: 300.0000 mg | ORAL_TABLET | Freq: Every day | ORAL | 0 refills | Status: DC

## 2020-09-13 NOTE — Telephone Encounter (Signed)
Pt calling wanting to get refill on 1. buPROPion (WELLBUTRIN XL) 300 MG 24 hr tablet 90 d/s 2. atorvastatin (LIPITOR) 40 MG tablet 90 d/s 3. diazepam (VALIUM) 5 MG tablet  [facility] [address]  [address], [place]) [place] [address]

## 2020-10-04 ENCOUNTER — Encounter: Payer: Self-pay | Admitting: Neurology

## 2020-10-04 ENCOUNTER — Ambulatory Visit (INDEPENDENT_AMBULATORY_CARE_PROVIDER_SITE_OTHER): Payer: Medicare Other | Admitting: Neurology

## 2020-10-04 VITALS — BP 121/72 | HR 72 | Ht 66.0 in | Wt 161.5 lb

## 2020-10-04 DIAGNOSIS — R202 Paresthesia of skin: Secondary | ICD-10-CM | POA: Diagnosis not present

## 2020-10-04 DIAGNOSIS — R269 Unspecified abnormalities of gait and mobility: Secondary | ICD-10-CM | POA: Diagnosis not present

## 2020-10-04 DIAGNOSIS — M2550 Pain in unspecified joint: Secondary | ICD-10-CM | POA: Diagnosis not present

## 2020-10-04 NOTE — Progress Notes (Signed)
Chief Complaint  Patient presents with  . New Patient (Initial Visit)    She is here with her sister, [name]. Referred to evaluate need for NCV/EMG. Reporting chronic left leg weakness. She also has muscle spasms in her lower back. She uses a rolling walker to assist with ambulation.    HISTORICAL  Julie Neal is a 65 year old female, seen in request by orthopedic surgeon Dr. Rolena Infante, [place], for evaluation of left low back pain, radiating pain to left lower extremity, gait abnormality, she is accompanied by her daughter [name] at today's visit October 04, 2020.    I reviewed and summarized the referring note.pmhx HLD Depression, polypharmacy treatment  She had lumbar decompression surgery in her 75s, presented with low back pain, radiating pain to left lower extremity, later also had cervical decompression surgery for neck pain, radiating pain to left upper extremity, left rotator cuff surgery,  She tripped and fell over breaks in 2019, suffered left foot fracture, noticed gradual onset gait abnormality, worsening low back pain since then  She described constant moderate left foot pain, involving top of forefoot, getting worse bearing weight, also complains of left-sided low back pain, radiating pain to left posterior thigh and, left calf, top of left foot,  She now lives with her sister, has relied on her walker since 2020.  Had a history of spinal stimulator placement, per patient, is not an MRI candidate,  I personally reviewed CT of lumbar spine November 2021, no acute abnormality, levocurvature of the thoracolumbar spine apex L2, mild multilevel degenerative changes, no evidence of significant canal or foraminal narrowing,  CT of left hip showed mild to moderate degenerative changes, but no acute bony abnormality,  REVIEW OF SYSTEMS: Full 14 system review of systems performed and notable only for as above All other review of systems were  negative.  ALLERGIES: Allergies  Allergen Reactions  . Mobic [Meloxicam] Anaphylaxis  . Penicillins Anaphylaxis  . Nubain [Nalbuphine Hcl] Diarrhea and Nausea And Vomiting    HOME MEDICATIONS: Current Outpatient Medications  Medication Sig Dispense Refill  . atorvastatin (LIPITOR) 40 MG tablet Take 1 tablet (40 mg total) by mouth daily. 90 tablet 3  . buPROPion (WELLBUTRIN XL) 300 MG 24 hr tablet Take 1 tablet (300 mg total) by mouth daily. 90 tablet 0  . diazepam (VALIUM) 5 MG tablet Take 1 tablet (5 mg total) by mouth 2 (two) times daily as needed. 60 tablet 2  . DULoxetine (CYMBALTA) 30 MG capsule TAKE 1 CAPSULE BY MOUTH EVERYDAY AT BEDTIME  1  . oxyCODONE-acetaminophen (PERCOCET/ROXICET) 5-325 MG tablet Take 1 tablet by mouth 2 (two) times daily.    Marland Kitchen tiZANidine (ZANAFLEX) 4 MG tablet TAKE 1 TABLET BY MOUTH 4 TIMES A DAY AS NEEDED  0  . traZODone (DESYREL) 100 MG tablet TAKE 2 TABLETS (200MG ) BY MOUTH EVERY EVENING AT BEDTIME 180 tablet 1   No current facility-administered medications for this visit.    PAST MEDICAL HISTORY: Past Medical History:  Diagnosis Date  . Anxiety   . Asymptomatic stenosis of right carotid artery    1-30%  . Cervicalgia   . Depression   . Fibromyalgia   . Fracture of ankle, bimalleolar, left, closed 08/10/2014  . Helicobacter pylori gastritis 10/2016  . Hyperlipidemia   . Left leg weakness   . Lumbago   . Memory loss   . Migraines   . Neuralgic migraines   . Osteoporosis     PAST SURGICAL HISTORY: Past Surgical  History:  Procedure Laterality Date  . ABDOMINAL HYSTERECTOMY    . BACK SURGERY    . HAND SURGERY Bilateral   . LEG SURGERY Bilateral   . NECK SURGERY    . SHOULDER SURGERY Left     FAMILY HISTORY: Family History  Problem Relation Age of Onset  . Heart attack Father   . Heart disease Father 66  . Alzheimer's disease Mother   . Stroke Mother   . Diabetes type II Brother   . Breast cancer Sister     SOCIAL  HISTORY: Social History   Socioeconomic History  . Marital status: Widowed    Spouse name: [name]  . Number of children: 1  . Years of education: college  . Highest education level: Not on file  Occupational History  . Occupation: Retired    Comment: disabled  Tobacco Use  . Smoking status: Former Smoker    Types: Cigarettes  . Smokeless tobacco: Never Used  . Tobacco comment: Quit in November 14, 1986  Substance and Sexual Activity  . Alcohol use: No    Alcohol/week: 0.0 standard drinks  . Drug use: No  . Sexual activity: Not on file  Other Topics Concern  . Not on file  Social History Narrative   Has a college education. Husband died 11/14/19 (pulmonary fibrosis).  Patient has one child.   Patient is disabled. Right handed.   Caffeine one cup of coffee.   Social Determinants of Health   Financial Resource Strain: Not on file  Food Insecurity: Not on file  Transportation Needs: Not on file  Physical Activity: Not on file  Stress: Not on file  Social Connections: Not on file  Intimate Partner Violence: Not on file     PHYSICAL EXAM   Vitals:   10/04/20 1404  BP: 121/72  Pulse: 72  Weight: 161 lb 8 oz (73.3 kg)  Height: 5\' 6"  (1.676 m)   Not recorded     Body mass index is 26.07 kg/m.  PHYSICAL EXAMNIATION:  Gen: NAD, conversant, well nourised, well groomed                     Cardiovascular: Regular rate rhythm, no peripheral edema, warm, nontender. Eyes: Conjunctivae clear without exudates or hemorrhage Neck: Supple, no carotid bruits. Pulmonary: Clear to auscultation bilaterally   NEUROLOGICAL EXAM:  MENTAL STATUS: Speech:    Speech is normal; fluent and spontaneous with normal comprehension.  Cognition:     Orientation to time, place and person     Normal recent and remote memory     Normal Attention span and concentration     Normal Language, naming, repeating,spontaneous speech     Fund of knowledge   CRANIAL NERVES: CN II: Visual fields are full to  confrontation. Pupils are round equal and briskly reactive to light. CN III, IV, VI: extraocular movement are normal. No ptosis. CN V: Facial sensation is intact to light touch CN VII: Face is symmetric with normal eye closure  CN VIII: Hearing is normal to causal conversation. CN IX, X: Phonation is normal. CN XI: Head turning and shoulder shrug are intact  MOTOR: Variable effort on motor examination, felt had no significant muscle weakness,  REFLEXES: Reflexes are 2+ and symmetric at the biceps, triceps, knees, and ankles. Plantar responses are flexor.  SENSORY: Intact to light touch, pinprick and vibratory sensation are intact in fingers and toes.  COORDINATION: There is no trunk or limb dysmetria noted.  GAIT/STANCE: Rely on her  walker to get up from seated position, exaggerated gait abnormality with excellent balance Romberg is absent.   DIAGNOSTIC DATA (LABS, IMAGING, TESTING) - I reviewed patient records, labs, notes, testing and imaging myself where available.   ASSESSMENT AND PLAN  HILARIE SINHA is a 65 y.o. female   History of lumbar decompression surgery, recurrent left-sided low back pain radiating pain to left lower extremity, gait abnormality,  Only mild degenerative changes on CT of lumbar spine  Variable effort on examinations,  EMG nerve conduction study to help further localize the problem, rule out left lower extremity neuropathy left lumbosacral radiculopathy    Marcial Pacas, M.D. Ph.D.  [facility] [address] [place], [place] [address] Ph: [phone] Fax: [phone]  CC:  Melina Schools, MD [address] [place],  [place] [address]  Nche, Charlene Brooke, NP

## 2020-10-16 DIAGNOSIS — G894 Chronic pain syndrome: Secondary | ICD-10-CM | POA: Diagnosis not present

## 2020-10-16 DIAGNOSIS — M62830 Muscle spasm of back: Secondary | ICD-10-CM | POA: Diagnosis not present

## 2020-10-16 DIAGNOSIS — M47817 Spondylosis without myelopathy or radiculopathy, lumbosacral region: Secondary | ICD-10-CM | POA: Diagnosis not present

## 2020-10-16 DIAGNOSIS — M47812 Spondylosis without myelopathy or radiculopathy, cervical region: Secondary | ICD-10-CM | POA: Diagnosis not present

## 2020-10-24 ENCOUNTER — Encounter: Payer: Medicare Other | Admitting: Neurology

## 2020-11-05 ENCOUNTER — Ambulatory Visit (INDEPENDENT_AMBULATORY_CARE_PROVIDER_SITE_OTHER): Payer: Medicare Other | Admitting: Neurology

## 2020-11-05 DIAGNOSIS — R202 Paresthesia of skin: Secondary | ICD-10-CM | POA: Diagnosis not present

## 2020-11-05 DIAGNOSIS — R269 Unspecified abnormalities of gait and mobility: Secondary | ICD-10-CM

## 2020-11-05 DIAGNOSIS — M5440 Lumbago with sciatica, unspecified side: Secondary | ICD-10-CM

## 2020-11-05 MED ORDER — DULOXETINE HCL 30 MG PO CPEP
30.0000 mg | ORAL_CAPSULE | Freq: Every day | ORAL | 3 refills | Status: DC
Start: 2020-11-05 — End: 2021-04-22

## 2020-11-05 NOTE — Progress Notes (Signed)
No chief complaint on file.   HISTORICAL  Julie Neal is a 65 year old female, seen in request by orthopedic surgeon Dr. Rolena Infante, [place], for evaluation of left low back pain, radiating pain to left lower extremity, gait abnormality, she is accompanied by her daughter Julie Neal at today's visit October 04, 2020.    I reviewed and summarized the referring note.pmhx HLD Depression, polypharmacy treatment  She had lumbar decompression surgery in her 60s, presented with low back pain, radiating pain to left lower extremity, later also had cervical decompression surgery for neck pain, radiating pain to left upper extremity, left rotator cuff surgery,  She tripped and fell over breaks in 2019, suffered left foot fracture, noticed gradual onset gait abnormality, worsening low back pain since then  She described constant moderate left foot pain, involving top of forefoot, getting worse bearing weight, also complains of left-sided low back pain, radiating pain to left posterior thigh and, left calf, top of left foot,  She now lives with her sister, has relied on her walker since 2020.  Had a history of spinal stimulator placement, per patient, is not an MRI candidate,  I personally reviewed CT of lumbar spine November 2021, no acute abnormality, levocurvature of the thoracolumbar spine apex L2, mild multilevel degenerative changes, no evidence of significant canal or foraminal narrowing,  CT of left hip showed mild to moderate degenerative changes, but no acute bony abnormality,  Update November 05, 2020 She return for electrodiagnostic study today, which was essentially normal, no evidence of large fiber peripheral neuropathy, inflammatory myopathy, or active lumbosacral radiculopathy.  She continue complains of significant low back pain, gait abnormality, rely on her walker,  REVIEW OF SYSTEMS: Full 14 system review of systems performed and notable only for as above All other review of  systems were negative.  ALLERGIES: Allergies  Allergen Reactions  . Mobic [Meloxicam] Anaphylaxis  . Penicillins Anaphylaxis  . Nubain [Nalbuphine Hcl] Diarrhea and Nausea And Vomiting    HOME MEDICATIONS: Current Outpatient Medications  Medication Sig Dispense Refill  . atorvastatin (LIPITOR) 40 MG tablet Take 1 tablet (40 mg total) by mouth daily. 90 tablet 3  . buPROPion (WELLBUTRIN XL) 300 MG 24 hr tablet Take 1 tablet (300 mg total) by mouth daily. 90 tablet 0  . diazepam (VALIUM) 5 MG tablet Take 1 tablet (5 mg total) by mouth 2 (two) times daily as needed. 60 tablet 2  . DULoxetine (CYMBALTA) 30 MG capsule Take 1 capsule (30 mg total) by mouth daily. 30 capsule 3  . oxyCODONE-acetaminophen (PERCOCET/ROXICET) 5-325 MG tablet Take 1 tablet by mouth 2 (two) times daily.    Marland Kitchen tiZANidine (ZANAFLEX) 4 MG tablet TAKE 1 TABLET BY MOUTH 4 TIMES A DAY AS NEEDED  0  . traZODone (DESYREL) 100 MG tablet TAKE 2 TABLETS (200MG ) BY MOUTH EVERY EVENING AT BEDTIME 180 tablet 1   No current facility-administered medications for this visit.    PAST MEDICAL HISTORY: Past Medical History:  Diagnosis Date  . Anxiety   . Asymptomatic stenosis of right carotid artery    1-30%  . Cervicalgia   . Depression   . Fibromyalgia   . Fracture of ankle, bimalleolar, left, closed 08/10/2014  . Helicobacter pylori gastritis 10/2016  . Hyperlipidemia   . Left leg weakness   . Lumbago   . Memory loss   . Migraines   . Neuralgic migraines   . Osteoporosis     PAST SURGICAL HISTORY: Past Surgical History:  Procedure  Laterality Date  . ABDOMINAL HYSTERECTOMY    . BACK SURGERY    . HAND SURGERY Bilateral   . LEG SURGERY Bilateral   . NECK SURGERY    . SHOULDER SURGERY Left     FAMILY HISTORY: Family History  Problem Relation Age of Onset  . Heart attack Father   . Heart disease Father 85  . Alzheimer's disease Mother   . Stroke Mother   . Diabetes type II Brother   . Breast cancer Sister      SOCIAL HISTORY: Social History   Socioeconomic History  . Marital status: Widowed    Spouse name: [name]  . Number of children: 1  . Years of education: college  . Highest education level: Not on file  Occupational History  . Occupation: Retired    Comment: disabled  Tobacco Use  . Smoking status: Former Smoker    Types: Cigarettes  . Smokeless tobacco: Never Used  . Tobacco comment: Quit in November 05, 1986  Substance and Sexual Activity  . Alcohol use: No    Alcohol/week: 0.0 standard drinks  . Drug use: No  . Sexual activity: Not on file  Other Topics Concern  . Not on file  Social History Narrative   Has a college education. Husband died 11-05-2019 (pulmonary fibrosis).  Patient has one child.   Patient is disabled. Right handed.   Caffeine one cup of coffee.   Social Determinants of Health   Financial Resource Strain: Not on file  Food Insecurity: Not on file  Transportation Needs: Not on file  Physical Activity: Not on file  Stress: Not on file  Social Connections: Not on file  Intimate Partner Violence: Not on file     PHYSICAL EXAM   There were no vitals filed for this visit. Not recorded     There is no height or weight on file to calculate BMI.  PHYSICAL EXAMNIATION:  Gen: NAD, conversant, well nourised, well groomed           NEUROLOGICAL EXAM:  MENTAL STATUS: Speech/cognition: Awake, alert oriented to history taking and casual conversation   CRANIAL NERVES: CN II: Visual fields are full to confrontation. Pupils are round equal and briskly reactive to light. CN III, IV, VI: extraocular movement are normal. No ptosis. CN V: Facial sensation is intact to light touch CN VII: Face is symmetric with normal eye closure  CN VIII: Hearing is normal to causal conversation. CN IX, X: Phonation is normal. CN XI: Head turning and shoulder shrug are intact  MOTOR: No significant upper or lower extremity weakness  REFLEXES: Reflexes are 1 and symmetric at the  biceps, triceps, knees, and ankles. Plantar responses are flexor.  SENSORY: Intact to light touch, pinprick and vibratory sensation are intact in fingers and toes.  COORDINATION: There is no trunk or limb dysmetria noted.  GAIT/STANCE: Rely on her walker to get up from seated position, exaggerated gait abnormality with excellent balance Romberg is absent.   DIAGNOSTIC DATA (LABS, IMAGING, TESTING) - I reviewed patient records, labs, notes, testing and imaging myself where available.   ASSESSMENT AND PLAN  Julie Neal is a 65 y.o. female   History of lumbar decompression surgery, recurrent left-sided low back pain radiating pain to left lower extremity, gait abnormality,  Only mild degenerative changes on CT of lumbar spine  EMG nerve conduction study is normal, no evidence of peripheral neuropathy, or bilateral lumbosacral radiculopathy  Her gait abnormality is mostly related to her low back pain  Add on Cymbalta 30 mg daily  Refer to physical therapy  Marcial Pacas, M.D. Ph.D.  [facility] [address] [place], [place] [address] Ph: [phone] Fax: [phone]  CC:  Melina Schools, MD [address] [place],  [place] [address]  Nche, Charlene Brooke, NP

## 2020-11-05 NOTE — Procedures (Signed)
Full Name: Julie Neal Gender: Female MRN #: [id number] Date of Birth: [birth year]    Visit Date: 11/05/2020 08:44 Age: 65 Years Examining Physician: Marcial Pacas, MD  Referring Physician: Marcial Pacas, MD History: 65 year old female with history of lumbar decompression surgery, presenting with recurrent low back pain, gait abnormality  Summary of the test: Nerve conduction study: Bilateral sural, superficial peroneal sensory responses were normal.  Bilateral tibial, peroneal to EDB motor responses were normal.  Tibial H reflexes were present and symmetric  Electromyography: Selected needle examination of bilateral lower extremity muscles and bilateral lumbosacral paraspinals were normal  Conclusion: This is a normal study.  There is no electrodiagnostic evidence of large fiber peripheral neuropathy or lumbosacral radiculopathy.    ------------------------------- Marcial Pacas, M.D. PhD  [facility] [address] [place], [place] [address] Tel: [phone] Fax: [phone]  Verbal informed consent was obtained from the patient, patient was informed of potential risk of procedure, including bruising, bleeding, hematoma formation, infection, muscle weakness, muscle pain, numbness, among others.        [facility]    Nerve / Sites Muscle Latency Ref. Amplitude Ref. Rel Amp Segments Distance Velocity Ref. Area    ms ms mV mV %  cm m/s m/s mVms  R Peroneal - EDB     Ankle EDB 4.4 ?6.5 2.6 ?2.0 100 Ankle - EDB 9   8.8     Fib head EDB 10.1  2.3  89.5 Fib head - Ankle 28 49 ?44 7.3     Pop fossa EDB 12.2  2.3  100 Pop fossa - Fib head 10 49 ?44 8.2         Pop fossa - Ankle      L Peroneal - EDB     Ankle EDB 4.3 ?6.5 2.7 ?2.0 100 Ankle - EDB 9   10.8     Fib head EDB 10.1  2.8  103 Fib head - Ankle 26 45 ?44 9.2     Pop fossa EDB 12.3  2.7  96.6 Pop fossa - Fib head 10 44 ?44 9.2         Pop fossa - Ankle      R Tibial - AH     Ankle AH 3.9 ?5.8 6.3 ?4.0  100 Ankle - AH 9   12.8     Pop fossa AH 12.7  3.4  54.3 Pop fossa - Ankle 37 42 ?41 10.3  L Tibial - AH     Ankle AH 4.4 ?5.8 6.9 ?4.0 100 Ankle - AH 9   12.3     Pop fossa AH 12.6  4.4  63.8 Pop fossa - Ankle 38 46 ?41 12.5             SNC    Nerve / Sites Rec. Site Peak Lat Ref.  Amp Ref. Segments Distance    ms ms V V  cm  R Sural - Ankle (Calf)     Calf Ankle 3.4 ?4.4 8 ?6 Calf - Ankle 14  L Sural - Ankle (Calf)     Calf Ankle 3.5 ?4.4 9 ?6 Calf - Ankle 14  R Superficial peroneal - Ankle     Lat leg Ankle 3.8 ?4.4 6 ?6 Lat leg - Ankle 14  L Superficial peroneal - Ankle     Lat leg Ankle 3.9 ?4.4 7 ?6 Lat leg - Ankle 14  F  Wave    Nerve F Lat Ref.   ms ms  R Tibial - AH 54.1 ?56.0  L Tibial - AH 53.6 ?56.0         H Reflex    Nerve H Lat   ms   Left Right Ref.  Tibial - Soleus 39.7 39.9 ?35.0         EMG Summary Table    Spontaneous MUAP Recruitment  Muscle IA Fib PSW Fasc Other Amp Dur. Poly Pattern  R. Tibialis anterior Normal None None None _______ Normal Normal Normal Normal  R. Tibialis posterior Normal None None None _______ Normal Normal Normal Normal  R. Peroneus longus Normal None None None _______ Normal Normal Normal Normal  R. Gastrocnemius (Medial head) Normal None None None _______ Normal Normal Normal Normal  R. Vastus lateralis Normal None None None _______ Normal Normal Normal Normal  L. Tibialis anterior Normal None None None _______ Normal Normal Normal Normal  L. Tibialis posterior Normal None None None _______ Normal Normal Normal Normal  L. Peroneus longus Normal None None None _______ Normal Normal Normal Normal  L. Vastus lateralis Normal None None None _______ Normal Normal Normal Normal  R. Lumbar paraspinals (low) Normal None None None _______ Normal Normal Normal Normal  L. Lumbar paraspinals (low) Normal None None None _______ Normal Normal Normal Normal  L. Lumbar paraspinals (mid) Normal None None None _______ Normal  Normal Normal Normal

## 2020-11-06 ENCOUNTER — Other Ambulatory Visit: Payer: Self-pay | Admitting: Nurse Practitioner

## 2020-11-06 DIAGNOSIS — F330 Major depressive disorder, recurrent, mild: Secondary | ICD-10-CM

## 2020-11-13 DIAGNOSIS — G894 Chronic pain syndrome: Secondary | ICD-10-CM | POA: Diagnosis not present

## 2020-11-13 DIAGNOSIS — M47817 Spondylosis without myelopathy or radiculopathy, lumbosacral region: Secondary | ICD-10-CM | POA: Diagnosis not present

## 2020-11-13 DIAGNOSIS — M47812 Spondylosis without myelopathy or radiculopathy, cervical region: Secondary | ICD-10-CM | POA: Diagnosis not present

## 2020-11-13 DIAGNOSIS — M62830 Muscle spasm of back: Secondary | ICD-10-CM | POA: Diagnosis not present

## 2020-11-19 NOTE — Progress Notes (Addendum)
Subjective:   Julie Neal is a 65 y.o. female who presents for Medicare Annual (Subsequent) preventive examination.  I connected with Sharla today by telephone and verified that I am speaking with the correct person using two identifiers. Location patient: home Location provider: work Persons participating in the virtual visit: patient, Marine scientist.    I discussed the limitations, risks, security and privacy concerns of performing an evaluation and management service by telephone and the availability of in person appointments. I also discussed with the patient that there may be a patient responsible charge related to this service. The patient expressed understanding and verbally consented to this telephonic visit.    Interactive audio and video telecommunications were attempted between this provider and patient, however failed, due to patient having technical difficulties OR patient did not have access to video capability.  We continued and completed visit with audio only.  Some vital signs may be absent or patient reported.   Time Spent with patient on telephone encounter: 20 minutes   Review of Systems     Cardiac Risk Factors include: dyslipidemia     Objective:    Today's Vitals   11/20/20 0945 11/20/20 0946  Weight: 161 lb (73 kg)   Height: 5\' 6"  (1.676 m)   PainSc:  8    Body mass index is 25.99 kg/m.  Advanced Directives 11/20/2020 08/19/2014  Does Patient Have a Medical Advance Directive? No No  Would patient like information on creating a medical advance directive? Yes (MAU/Ambulatory/Procedural Areas - Information given) No - patient declined information  Some encounter information is confidential and restricted. Go to Review Flowsheets activity to see all data.    Current Medications (verified) Outpatient Encounter Medications as of 11/20/2020  Medication Sig  . atorvastatin (LIPITOR) 40 MG tablet Take 1 tablet (40 mg total) by mouth daily.  Marland Kitchen buPROPion (WELLBUTRIN  XL) 300 MG 24 hr tablet Take 1 tablet by mouth once daily  . diazepam (VALIUM) 5 MG tablet Take 1 tablet (5 mg total) by mouth 2 (two) times daily as needed.  . DULoxetine (CYMBALTA) 30 MG capsule Take 1 capsule (30 mg total) by mouth daily.  Marland Kitchen oxyCODONE-acetaminophen (PERCOCET/ROXICET) 5-325 MG tablet Take 1 tablet by mouth 2 (two) times daily.  Marland Kitchen tiZANidine (ZANAFLEX) 4 MG tablet TAKE 1 TABLET BY MOUTH 4 TIMES A DAY AS NEEDED  . traZODone (DESYREL) 100 MG tablet TAKE 2 TABLETS (200MG ) BY MOUTH EVERY EVENING AT BEDTIME   No facility-administered encounter medications on file as of 11/20/2020.    Allergies (verified) Mobic [meloxicam], Penicillins, and Nubain [nalbuphine hcl]   History: Past Medical History:  Diagnosis Date  . Anxiety   . Asymptomatic stenosis of right carotid artery    1-30%  . Cervicalgia   . Depression   . Fibromyalgia   . Fracture of ankle, bimalleolar, left, closed 08/10/2014  . Helicobacter pylori gastritis 10/2016  . Hyperlipidemia   . Left leg weakness   . Lumbago   . Memory loss   . Migraines   . Neuralgic migraines   . Osteoporosis    Past Surgical History:  Procedure Laterality Date  . ABDOMINAL HYSTERECTOMY    . BACK SURGERY    . HAND SURGERY Bilateral   . LEG SURGERY Bilateral   . NECK SURGERY    . SHOULDER SURGERY Left    Family History  Problem Relation Age of Onset  . Heart attack Father   . Heart disease Father 39  . Alzheimer's disease  Mother   . Stroke Mother   . Diabetes type II Brother   . Breast cancer Sister    Social History   Socioeconomic History  . Marital status: Widowed    Spouse name: [name]  . Number of children: 1  . Years of education: college  . Highest education level: Not on file  Occupational History  . Occupation: Retired    Comment: disabled  Tobacco Use  . Smoking status: Former Smoker    Types: Cigarettes  . Smokeless tobacco: Never Used  . Tobacco comment: Quit in 11/11/1986  Substance and Sexual  Activity  . Alcohol use: No    Alcohol/week: 0.0 standard drinks  . Drug use: No  . Sexual activity: Not on file  Other Topics Concern  . Not on file  Social History Narrative   Has a college education. Husband died 2019-11-11 (pulmonary fibrosis).  Patient has one child.   Patient is disabled. Right handed.   Caffeine one cup of coffee.   Social Determinants of Health   Financial Resource Strain: Low Risk   . Difficulty of Paying Living Expenses: Not hard at all  Food Insecurity: No Food Insecurity  . Worried About Charity fundraiser in the Last Year: Never true  . Ran Out of Food in the Last Year: Never true  Transportation Needs: No Transportation Needs  . Lack of Transportation (Medical): No  . Lack of Transportation (Non-Medical): No  Physical Activity: Sufficiently Active  . Days of Exercise per Week: 7 days  . Minutes of Exercise per Session: 30 min  Stress: No Stress Concern Present  . Feeling of Stress : Not at all  Social Connections: Moderately Isolated  . Frequency of Communication with Friends and Family: More than three times a week  . Frequency of Social Gatherings with Friends and Family: More than three times a week  . Attends Religious Services: More than 4 times per year  . Active Member of Clubs or Organizations: No  . Attends Archivist Meetings: Never  . Marital Status: Widowed    Tobacco Counseling Counseling given: Not Answered Comment: Quit in 11/11/1986   Clinical Intake:  Pre-visit preparation completed: Yes  Pain : 0-10 Pain Score: 8  Pain Type: Chronic pain (Fibromyalgia) Pain Location: Generalized Pain Onset: More than a month ago Pain Frequency: Constant     Nutritional Status: BMI 25 -29 Overweight Nutritional Risks: None Diabetes: No  How often do you need to have someone help you when you read instructions, pamphlets, or other written materials from your doctor or pharmacy?: 1 - Never  Diabetic?No  Interpreter Needed?:  No  Information entered by :: Caroleen Hamman LPN   Activities of Daily Living In your present state of health, do you have any difficulty performing the following activities: 11/20/2020  Hearing? N  Vision? N  Difficulty concentrating or making decisions? N  Walking or climbing stairs? N  Dressing or bathing? N  Doing errands, shopping? N  Preparing Food and eating ? N  Using the Toilet? N  In the past six months, have you accidently leaked urine? Y  Do you have problems with loss of bowel control? N  Managing your Medications? N  Managing your Finances? N  Housekeeping or managing your Housekeeping? N  Some recent data might be hidden    Patient Care Team: Nche, Charlene Brooke, NP as PCP - General (Internal Medicine) Melina Schools, MD as Referring Physician (Orthopedic Surgery)  Indicate any recent Medical  Services you may have received from other than [facility] providers in the past year (date may be approximate).     Assessment:   This is a routine wellness examination for Julie Neal.  Hearing/Vision screen  Hearing Screening   125Hz  250Hz  500Hz  1000Hz  2000Hz  3000Hz  4000Hz  6000Hz  8000Hz   Right ear:           Left ear:           Comments: No issues  Vision Screening Comments: Wears glasses Last eye exam-2 months ago-[place]  Dietary issues and exercise activities discussed: Current Exercise Habits: Home exercise routine, Type of exercise: walking, Time (Minutes): 30, Frequency (Times/Week): 7, Weekly Exercise (Minutes/Week): 210, Intensity: Mild, Exercise limited by: None identified  Goals    . Patient Stated     Increase activity, drink more water & eat healthier      Depression Screen PHQ 2/9 Scores 11/20/2020 06/26/2020 06/01/2017 03/26/2017 03/23/2017 10/20/2016  PHQ - 2 Score 0 3 0 0 2 2  PHQ- 9 Score - 7 1 1 5 4     Fall Risk Fall Risk  11/20/2020 06/26/2020 06/01/2017  Falls in the past year? 1 1 No  Number falls in past yr: 1 1 -  Injury with Fall? 0 0 -  Risk  for fall due to : History of fall(s);Impaired balance/gait - -  Follow up Falls prevention discussed - -    FALL RISK PREVENTION PERTAINING TO THE HOME:  Any stairs in or around the home? Yes  If so, are there any without handrails? No  Home free of loose throw rugs in walkways, pet beds, electrical cords, etc? Yes  Adequate lighting in your home to reduce risk of falls? Yes   ASSISTIVE DEVICES UTILIZED TO PREVENT FALLS:  Life alert? No  Use of a cane, walker or w/c? Yes  Grab bars in the bathroom? Yes  Shower chair or bench in shower? No  Elevated toilet seat or a handicapped toilet? No   TIMED UP AND GO:  Was the test performed? No . Phone visit   Cognitive Function:Normal cognitive status assessed by  this Nurse Health Advisor. No abnormalities found.          Immunizations Immunization History  Administered Date(s) Administered  . Fluad Quad(high Dose 65+) 06/26/2020  . Influenza, High Dose Seasonal PF 05/31/2018  . Influenza,inj,Quad PF,6+ Mos 07/18/2014, 07/23/2015, 05/23/2016, 05/15/2017  . Influenza-Unspecified 05/12/2019  . PFIZER(Purple Top)SARS-COV-2 Vaccination 11/10/2019, 12/05/2019, 06/11/2020  . Tdap 07/18/2014, 05/16/2016    TDAP status: Up to date  Flu Vaccine status: Up to date  Pneumococcal vaccine status: Due, Education has been provided regarding the importance of this vaccine. Advised may receive this vaccine at local pharmacy or Health Dept. Aware to provide a copy of the vaccination record if obtained from local pharmacy or Health Dept. Verbalized acceptance and understanding.  Covid-19 vaccine status: Completed vaccines  Qualifies for Shingles Vaccine? Yes   Zostavax completed No   Shingrix Completed?: No.    Education has been provided regarding the importance of this vaccine. Patient has been advised to call insurance company to determine out of pocket expense if they have not yet received this vaccine. Advised may also receive vaccine at  local pharmacy or Health Dept. Verbalized acceptance and understanding.  Screening Tests Health Maintenance  Topic Date Due  . MAMMOGRAM  11/19/2019  . PNA vac Low Risk Adult (1 of 2 - PCV13) Never done  . INFLUENZA VACCINE  03/11/2021  . TETANUS/TDAP  05/16/2026  .  COLONOSCOPY (Pts 45-7yrs Insurance coverage will need to be confirmed)  03/20/2030  . DEXA SCAN  Completed  . COVID-19 Vaccine  Completed  . Hepatitis C Screening  Completed  . HIV Screening  Completed  . HPV VACCINES  Aged Out  . PAP SMEAR-Modifier  Discontinued    Health Maintenance  Health Maintenance Due  Topic Date Due  . MAMMOGRAM  11/19/2019  . PNA vac Low Risk Adult (1 of 2 - PCV13) Never done    Colorectal cancer screening: Type of screening: FOBT/FIT. Completed 03/20/2020. Repeat every 1 years  Mammogram status: Ordered today. Pt provided with contact info and advised to call to schedule appt.   Bone Density status: Completed 12/09/2019. Results reflect: Bone density results: OSTEOPENIA. Repeat every 2 years.  Lung Cancer Screening: (Low Dose CT Chest recommended if Age 46-80 years, 30 pack-year currently smoking OR have quit w/in 15years.) does not qualify.     Additional Screening:  Hepatitis C Screening:Completed 10/20/2016  Vision Screening: Recommended annual ophthalmology exams for early detection of glaucoma and other disorders of the eye. Is the patient up to date with their annual eye exam?  Yes  Who is the provider or what is the name of the office in which the patient attends annual eye exams? [facility]   Dental Screening: Recommended annual dental exams for proper oral hygiene  Community Resource Referral / Chronic Care Management: CRR required this visit?  No   CCM required this visit?  No      Plan:     I have personally reviewed and noted the following in the patient's chart:   . Medical and social history . Use of alcohol, tobacco or illicit drugs  . Current  medications and supplements . Functional ability and status . Nutritional status . Physical activity . Advanced directives . List of other physicians . Hospitalizations, surgeries, and ER visits in previous 12 months . Vitals . Screenings to include cognitive, depression, and falls . Referrals and appointments  In addition, I have reviewed and discussed with patient certain preventive protocols, quality metrics, and best practice recommendations. A written personalized care plan for preventive services as well as general preventive health recommendations were provided to patient.   Due to this being a telephonic visit, the after visit summary with patients personalized plan was offered to patient via mail or my-chart.  Per request, patient was mailed a copy of Ila, LPN   02/27/9469  Nurse Health Advisor  Nurse Notes: None

## 2020-11-20 ENCOUNTER — Ambulatory Visit (INDEPENDENT_AMBULATORY_CARE_PROVIDER_SITE_OTHER): Payer: Medicare Other

## 2020-11-20 VITALS — Ht 66.0 in | Wt 161.0 lb

## 2020-11-20 DIAGNOSIS — Z1231 Encounter for screening mammogram for malignant neoplasm of breast: Secondary | ICD-10-CM | POA: Diagnosis not present

## 2020-11-20 DIAGNOSIS — Z0000 Encounter for general adult medical examination without abnormal findings: Secondary | ICD-10-CM | POA: Diagnosis not present

## 2020-11-20 NOTE — Patient Instructions (Signed)
Julie Neal , Thank you for taking time to complete your Medicare Wellness Visit. I appreciate your ongoing commitment to your health goals. Please review the following plan we discussed and let me know if I can assist you in the future.   Screening recommendations/referrals: Colonoscopy: Completed FOBT 03/20/2020-Due 03/20/2021 Mammogram: Ordered today-Someone will be calling you to schedule Bone Density: Completed 12/09/2019-Due 12/08/2021 Recommended yearly ophthalmology/optometry visit for glaucoma screening and checkup Recommended yearly dental visit for hygiene and checkup  Vaccinations: Influenza vaccine: Up to date Pneumococcal vaccine: Please bring documentation to your next visit Tdap vaccine: Up to date-Due-05/16/2026 Shingles vaccine:  Please bring documentation to your next visit  Covid-19:Completed vaccines  Advanced directives: Information mailed today  Conditions/risks identified: See problem list  Next appointment: Follow up in one year for your annual wellness visit 11/26/2021 @ 9:45   Preventive Care 65 Years and Older, Female Preventive care refers to lifestyle choices and visits with your health care provider that can promote health and wellness. What does preventive care include?  A yearly physical exam. This is also called an annual well check.  Dental exams once or twice a year.  Routine eye exams. Ask your health care provider how often you should have your eyes checked.  Personal lifestyle choices, including:  Daily care of your teeth and gums.  Regular physical activity.  Eating a healthy diet.  Avoiding tobacco and drug use.  Limiting alcohol use.  Practicing safe sex.  Taking low-dose aspirin every day.  Taking vitamin and mineral supplements as recommended by your health care provider. What happens during an annual well check? The services and screenings done by your health care provider during your annual well check will depend on your age,  overall health, lifestyle risk factors, and family history of disease. Counseling  Your health care provider may ask you questions about your:  Alcohol use.  Tobacco use.  Drug use.  Emotional well-being.  Home and relationship well-being.  Sexual activity.  Eating habits.  History of falls.  Memory and ability to understand (cognition).  Work and work Statistician.  Reproductive health. Screening  You may have the following tests or measurements:  Height, weight, and BMI.  Blood pressure.  Lipid and cholesterol levels. These may be checked every 5 years, or more frequently if you are over 36 years old.  Skin check.  Lung cancer screening. You may have this screening every year starting at age 44 if you have a 30-pack-year history of smoking and currently smoke or have quit within the past 15 years.  Fecal occult blood test (FOBT) of the stool. You may have this test every year starting at age 41.  Flexible sigmoidoscopy or colonoscopy. You may have a sigmoidoscopy every 5 years or a colonoscopy every 10 years starting at age 47.  Hepatitis C blood test.  Hepatitis B blood test.  Sexually transmitted disease (STD) testing.  Diabetes screening. This is done by checking your blood sugar (glucose) after you have not eaten for a while (fasting). You may have this done every 1-3 years.  Bone density scan. This is done to screen for osteoporosis. You may have this done starting at age 38.  Mammogram. This may be done every 1-2 years. Talk to your health care provider about how often you should have regular mammograms. Talk with your health care provider about your test results, treatment options, and if necessary, the need for more tests. Vaccines  Your health care provider may recommend certain vaccines,  such as:  Influenza vaccine. This is recommended every year.  Tetanus, diphtheria, and acellular pertussis (Tdap, Td) vaccine. You may need a Td booster every 10  years.  Zoster vaccine. You may need this after age 81.  Pneumococcal 13-valent conjugate (PCV13) vaccine. One dose is recommended after age 22.  Pneumococcal polysaccharide (PPSV23) vaccine. One dose is recommended after age 25. Talk to your health care provider about which screenings and vaccines you need and how often you need them. This information is not intended to replace advice given to you by your health care provider. Make sure you discuss any questions you have with your health care provider. Document Released: 08/24/2015 Document Revised: 04/16/2016 Document Reviewed: 05/29/2015 Elsevier Interactive Patient Education  2017 [place] Prevention in the Home Falls can cause injuries. They can happen to people of all ages. There are many things you can do to make your home safe and to help prevent falls. What can I do on the outside of my home?  Regularly fix the edges of walkways and driveways and fix any cracks.  Remove anything that might make you trip as you walk through a door, such as a raised step or threshold.  Trim any bushes or trees on the path to your home.  Use bright outdoor lighting.  Clear any walking paths of anything that might make someone trip, such as rocks or tools.  Regularly check to see if handrails are loose or broken. Make sure that both sides of any steps have handrails.  Any raised decks and porches should have guardrails on the edges.  Have any leaves, snow, or ice cleared regularly.  Use sand or salt on walking paths during winter.  Clean up any spills in your garage right away. This includes oil or grease spills. What can I do in the bathroom?  Use night lights.  Install grab bars by the toilet and in the tub and shower. Do not use towel bars as grab bars.  Use non-skid mats or decals in the tub or shower.  If you need to sit down in the shower, use a plastic, non-slip stool.  Keep the floor dry. Clean up any water that  spills on the floor as soon as it happens.  Remove soap buildup in the tub or shower regularly.  Attach bath mats securely with double-sided non-slip rug tape.  Do not have throw rugs and other things on the floor that can make you trip. What can I do in the bedroom?  Use night lights.  Make sure that you have a light by your bed that is easy to reach.  Do not use any sheets or blankets that are too big for your bed. They should not hang down onto the floor.  Have a firm chair that has side arms. You can use this for support while you get dressed.  Do not have throw rugs and other things on the floor that can make you trip. What can I do in the kitchen?  Clean up any spills right away.  Avoid walking on wet floors.  Keep items that you use a lot in easy-to-reach places.  If you need to reach something above you, use a strong step stool that has a grab bar.  Keep electrical cords out of the way.  Do not use floor polish or wax that makes floors slippery. If you must use wax, use non-skid floor wax.  Do not have throw rugs and other things on  the floor that can make you trip. What can I do with my stairs?  Do not leave any items on the stairs.  Make sure that there are handrails on both sides of the stairs and use them. Fix handrails that are broken or loose. Make sure that handrails are as long as the stairways.  Check any carpeting to make sure that it is firmly attached to the stairs. Fix any carpet that is loose or worn.  Avoid having throw rugs at the top or bottom of the stairs. If you do have throw rugs, attach them to the floor with carpet tape.  Make sure that you have a light switch at the top of the stairs and the bottom of the stairs. If you do not have them, ask someone to add them for you. What else can I do to help prevent falls?  Wear shoes that:  Do not have high heels.  Have rubber bottoms.  Are comfortable and fit you well.  Are closed at the  toe. Do not wear sandals.  If you use a stepladder:  Make sure that it is fully opened. Do not climb a closed stepladder.  Make sure that both sides of the stepladder are locked into place.  Ask someone to hold it for you, if possible.  Clearly mark and make sure that you can see:  Any grab bars or handrails.  First and last steps.  Where the edge of each step is.  Use tools that help you move around (mobility aids) if they are needed. These include:  Canes.  Walkers.  Scooters.  Crutches.  Turn on the lights when you go into a dark area. Replace any light bulbs as soon as they burn out.  Set up your furniture so you have a clear path. Avoid moving your furniture around.  If any of your floors are uneven, fix them.  If there are any pets around you, be aware of where they are.  Review your medicines with your doctor. Some medicines can make you feel dizzy. This can increase your chance of falling. Ask your doctor what other things that you can do to help prevent falls. This information is not intended to replace advice given to you by your health care provider. Make sure you discuss any questions you have with your health care provider. Document Released: 05/24/2009 Document Revised: 01/03/2016 Document Reviewed: 09/01/2014 Elsevier Interactive Patient Education  2017 [employer].

## 2020-12-05 ENCOUNTER — Other Ambulatory Visit: Payer: Self-pay

## 2020-12-05 ENCOUNTER — Ambulatory Visit (INDEPENDENT_AMBULATORY_CARE_PROVIDER_SITE_OTHER): Payer: Medicare Other | Admitting: Family Medicine

## 2020-12-05 ENCOUNTER — Encounter: Payer: Self-pay | Admitting: Family Medicine

## 2020-12-05 VITALS — BP 90/60 | HR 85 | Temp 98.4°F | Ht 66.0 in | Wt 168.0 lb

## 2020-12-05 DIAGNOSIS — G43719 Chronic migraine without aura, intractable, without status migrainosus: Secondary | ICD-10-CM

## 2020-12-05 MED ORDER — ONDANSETRON HCL 4 MG PO TABS: 4.0000 mg | ORAL_TABLET | Freq: Three times a day (TID) | ORAL | 0 refills | Status: DC | PRN

## 2020-12-05 MED ORDER — SUMATRIPTAN SUCCINATE 50 MG PO TABS: 50.0000 mg | ORAL_TABLET | Freq: Once | ORAL | 2 refills | Status: DC | PRN

## 2020-12-05 MED ORDER — KETOROLAC TROMETHAMINE 60 MG/2ML IM SOLN
60.0000 mg | Freq: Once | INTRAMUSCULAR | Status: AC
  Administered 2020-12-05: 60 mg via INTRAMUSCULAR

## 2020-12-05 NOTE — Progress Notes (Signed)
Julie Neal is a 65 y.o. female  Chief Complaint  Patient presents with  . Acute Visit    Pt c/o having Migraine for 3 days, lt side and the back of her head. No other symptoms.  Pt taken Percocet w/no relief from HA.    HPI: Julie Neal is a 65 y.o. female who complains of 3 day h/o migraine headache. Lt sided. + photophobia. + phonophobia. + nausea, no vomiting.  Pt took percocet without relief. She has a h/o migraine headaches but has not had one in over a year. In the past she received botox injections for migraines.  Past Medical History:  Diagnosis Date  . Anxiety   . Asymptomatic stenosis of right carotid artery    1-30%  . Cervicalgia   . Depression   . Fibromyalgia   . Fracture of ankle, bimalleolar, left, closed 08/10/2014  . Helicobacter pylori gastritis 10/2016  . Hyperlipidemia   . Left leg weakness   . Lumbago   . Memory loss   . Migraines   . Neuralgic migraines   . Osteoporosis     Past Surgical History:  Procedure Laterality Date  . ABDOMINAL HYSTERECTOMY    . BACK SURGERY    . HAND SURGERY Bilateral   . LEG SURGERY Bilateral   . NECK SURGERY    . SHOULDER SURGERY Left     Social History   Socioeconomic History  . Marital status: Widowed    Spouse name: [name]  . Number of children: 1  . Years of education: college  . Highest education level: Not on file  Occupational History  . Occupation: Retired    Comment: disabled  Tobacco Use  . Smoking status: Former Smoker    Types: Cigarettes  . Smokeless tobacco: Never Used  . Tobacco comment: Quit in 28-Nov-1986  Substance and Sexual Activity  . Alcohol use: No    Alcohol/week: 0.0 standard drinks  . Drug use: No  . Sexual activity: Not on file  Other Topics Concern  . Not on file  Social History Narrative   Has a college education. Husband died 28-Nov-2019 (pulmonary fibrosis).  Patient has one child.   Patient is disabled. Right handed.   Caffeine one cup of coffee.   Social Determinants  of Health   Financial Resource Strain: Low Risk   . Difficulty of Paying Living Expenses: Not hard at all  Food Insecurity: No Food Insecurity  . Worried About Charity fundraiser in the Last Year: Never true  . Ran Out of Food in the Last Year: Never true  Transportation Needs: No Transportation Needs  . Lack of Transportation (Medical): No  . Lack of Transportation (Non-Medical): No  Physical Activity: Sufficiently Active  . Days of Exercise per Week: 7 days  . Minutes of Exercise per Session: 30 min  Stress: No Stress Concern Present  . Feeling of Stress : Not at all  Social Connections: Moderately Isolated  . Frequency of Communication with Friends and Family: More than three times a week  . Frequency of Social Gatherings with Friends and Family: More than three times a week  . Attends Religious Services: More than 4 times per year  . Active Member of Clubs or Organizations: No  . Attends Archivist Meetings: Never  . Marital Status: Widowed  Intimate Partner Violence: Not At Risk  . Fear of Current or Ex-Partner: No  . Emotionally Abused: No  . Physically Abused: No  .  Sexually Abused: No    Family History  Problem Relation Age of Onset  . Heart attack Father   . Heart disease Father 29  . Alzheimer's disease Mother   . Stroke Mother   . Diabetes type II Brother   . Breast cancer Sister      Immunization History  Administered Date(s) Administered  . Fluad Quad(high Dose 65+) 06/26/2020  . Influenza, High Dose Seasonal PF 05/31/2018  . Influenza,inj,Quad PF,6+ Mos 07/18/2014, 07/23/2015, 05/23/2016, 05/15/2017  . Influenza-Unspecified 05/12/2019  . PFIZER(Purple Top)SARS-COV-2 Vaccination 11/10/2019, 12/05/2019, 06/11/2020  . Tdap 07/18/2014, 05/16/2016    Outpatient Encounter Medications as of 12/05/2020  Medication Sig Note  . atorvastatin (LIPITOR) 40 MG tablet Take 1 tablet (40 mg total) by mouth daily.   Marland Kitchen buPROPion (WELLBUTRIN XL) 300 MG 24 hr  tablet Take 1 tablet by mouth once daily   . diazepam (VALIUM) 5 MG tablet Take 1 tablet (5 mg total) by mouth 2 (two) times daily as needed.   . DULoxetine (CYMBALTA) 30 MG capsule Take 1 capsule (30 mg total) by mouth daily.   . ondansetron (ZOFRAN) 4 MG tablet Take 1 tablet (4 mg total) by mouth every 8 (eight) hours as needed for nausea or vomiting.   Marland Kitchen oxyCODONE-acetaminophen (PERCOCET/ROXICET) 5-325 MG tablet Take 1 tablet by mouth 2 (two) times daily. 12/26/2019: Taking differently-10-325 mg every 6 hours  . SUMAtriptan (IMITREX) 50 MG tablet Take 1 tablet (50 mg total) by mouth once as needed for up to 1 dose for migraine (may repeat x 1 tab in 1-2hrs if headache persists). May repeat in 2 hours if headache persists or recurs.   Marland Kitchen tiZANidine (ZANAFLEX) 4 MG tablet TAKE 1 TABLET BY MOUTH 4 TIMES A DAY AS NEEDED   . traZODone (DESYREL) 100 MG tablet TAKE 2 TABLETS (200MG ) BY MOUTH EVERY EVENING AT BEDTIME    Facility-Administered Encounter Medications as of 12/05/2020  Medication  . ketorolac (TORADOL) injection 60 mg     ROS: Pertinent positives and negatives noted in HPI. Remainder of ROS non-contributory    Allergies  Allergen Reactions  . Mobic [Meloxicam] Anaphylaxis  . Penicillins Anaphylaxis  . Nubain [Nalbuphine Hcl] Diarrhea and Nausea And Vomiting    BP 90/60 (BP Location: Right Arm, Patient Position: Sitting, Cuff Size: Normal)   Pulse 85   Temp 98.4 F (36.9 C) (Oral)   Ht 5\' 6"  (1.676 m)   Wt 168 lb (76.2 kg)   SpO2 97%   BMI 27.12 kg/m   Physical Exam Constitutional:      General: She is not in acute distress (no acute distress but pt is laying on exam table in the dark). HENT:     Head: Normocephalic and atraumatic.  Eyes:     Extraocular Movements: Extraocular movements intact.     Pupils: Pupils are equal, round, and reactive to light.  Pulmonary:     Effort: No respiratory distress.  Neurological:     General: No focal deficit present.     Mental  Status: She is alert and oriented to person, place, and time.  Psychiatric:        Behavior: Behavior normal.      A/P:  1. Intractable chronic migraine without aura and without status migrainosus Rx: - ketorolac (TORADOL) injection 60 mg - SUMAtriptan (IMITREX) 50 MG tablet; Take 1 tablet (50 mg total) by mouth once as needed for up to 1 dose for migraine (may repeat x 1 tab in 1-2hrs if headache  persists). May repeat in 2 hours if headache persists or recurs.  Dispense: 10 tablet; Refill: 2 - ondansetron (ZOFRAN) 4 MG tablet; Take 1 tablet (4 mg total) by mouth every 8 (eight) hours as needed for nausea or vomiting.  Dispense: 20 tablet; Refill: 0 - f/u if symptoms worsen or do not improve in 2-3 days Discussed plan and reviewed medications with patient, including risks, benefits, and potential side effects. Pt expressed understand. All questions answered.   This visit occurred during the SARS-CoV-2 public health emergency.  Safety protocols were in place, including screening questions prior to the visit, additional usage of staff PPE, and extensive cleaning of exam room while observing appropriate contact time as indicated for disinfecting solutions.

## 2020-12-05 NOTE — Patient Instructions (Signed)
Health Maintenance Due  Topic Date Due  . MAMMOGRAM  11/19/2019  . PNA vac Low Risk Adult (1 of 2 - PCV13) Never done    Depression screen [facility] 2/9 11/20/2020 06/26/2020 06/01/2017  Decreased Interest 0 2 0  Down, Depressed, Hopeless 0 1 0  PHQ - 2 Score 0 3 0  Altered sleeping - 1 0  Tired, decreased energy - 2 0  Change in appetite - 0 0  Feeling bad or failure about yourself  - 0 1  Trouble concentrating - 1 0  Moving slowly or fidgety/restless - 0 0  Suicidal thoughts - 0 0  PHQ-9 Score - 7 1  Difficult doing work/chores - Not difficult at all Not difficult at all

## 2020-12-11 DIAGNOSIS — M47812 Spondylosis without myelopathy or radiculopathy, cervical region: Secondary | ICD-10-CM | POA: Diagnosis not present

## 2020-12-11 DIAGNOSIS — G894 Chronic pain syndrome: Secondary | ICD-10-CM | POA: Diagnosis not present

## 2020-12-11 DIAGNOSIS — M47817 Spondylosis without myelopathy or radiculopathy, lumbosacral region: Secondary | ICD-10-CM | POA: Diagnosis not present

## 2020-12-11 DIAGNOSIS — M62830 Muscle spasm of back: Secondary | ICD-10-CM | POA: Diagnosis not present

## 2020-12-14 DIAGNOSIS — M791 Myalgia, unspecified site: Secondary | ICD-10-CM | POA: Diagnosis not present

## 2020-12-21 ENCOUNTER — Other Ambulatory Visit: Payer: Self-pay

## 2020-12-24 ENCOUNTER — Other Ambulatory Visit: Payer: Self-pay

## 2020-12-24 ENCOUNTER — Encounter: Payer: Self-pay | Admitting: Nurse Practitioner

## 2020-12-24 ENCOUNTER — Ambulatory Visit (INDEPENDENT_AMBULATORY_CARE_PROVIDER_SITE_OTHER): Payer: Medicare Other | Admitting: Nurse Practitioner

## 2020-12-24 VITALS — BP 118/60 | HR 74 | Temp 97.3°F | Ht 66.0 in | Wt 167.0 lb

## 2020-12-24 DIAGNOSIS — F3341 Major depressive disorder, recurrent, in partial remission: Secondary | ICD-10-CM | POA: Diagnosis not present

## 2020-12-24 DIAGNOSIS — Z79899 Other long term (current) drug therapy: Secondary | ICD-10-CM | POA: Diagnosis not present

## 2020-12-24 DIAGNOSIS — R21 Rash and other nonspecific skin eruption: Secondary | ICD-10-CM | POA: Diagnosis not present

## 2020-12-24 DIAGNOSIS — E7800 Pure hypercholesterolemia, unspecified: Secondary | ICD-10-CM | POA: Diagnosis not present

## 2020-12-24 LAB — BASIC METABOLIC PANEL
BUN: 26 mg/dL — ABNORMAL HIGH (ref 6–23)
CO2: 30 mEq/L (ref 19–32)
Calcium: 9.8 mg/dL (ref 8.4–10.5)
Chloride: 102 mEq/L (ref 96–112)
Creatinine, Ser: 0.97 mg/dL (ref 0.40–1.20)
GFR: 61.31 mL/min (ref >60.00)
Glucose, Bld: 105 mg/dL — ABNORMAL HIGH (ref 70–99)
Potassium: 3.9 mEq/L (ref 3.5–5.1)
Sodium: 140 mEq/L (ref 135–145)

## 2020-12-24 LAB — LIPID PANEL
Cholesterol: 160 mg/dL (ref 0–200)
HDL: 47 mg/dL (ref >39.00)
LDL Cholesterol: 83 mg/dL (ref 0–99)
NonHDL: 113.13
Total CHOL/HDL Ratio: 3
Triglycerides: 149 mg/dL (ref 0.0–149.0)
VLDL: 29.8 mg/dL (ref 0.0–40.0)

## 2020-12-24 LAB — HEPATIC FUNCTION PANEL
ALT: 13 U/L (ref 0–35)
AST: 20 U/L (ref 0–37)
Albumin: 4.3 g/dL (ref 3.5–5.2)
Alkaline Phosphatase: 52 U/L (ref 39–117)
Bilirubin, Direct: 0.1 mg/dL (ref 0.0–0.3)
Total Bilirubin: 0.5 mg/dL (ref 0.2–1.2)
Total Protein: 6.9 g/dL (ref 6.0–8.3)

## 2020-12-24 MED ORDER — MUPIROCIN 2 % EX OINT: 1.0000 "application " | TOPICAL_OINTMENT | Freq: Two times a day (BID) | CUTANEOUS | 0 refills | Status: DC

## 2020-12-24 MED ORDER — BUPROPION HCL ER (XL) 300 MG PO TB24: 1.0000 | ORAL_TABLET | Freq: Every day | ORAL | 3 refills | Status: DC

## 2020-12-24 NOTE — Progress Notes (Signed)
Subjective:  Patient ID: Julie Neal, female    DOB: [birth year]  Age: 65 y.o. MRN: [id number]  CC: Follow-up (6 month f/u on hyperlipidemia and depression. )  Rash This is a new problem. The current episode started 1 to 4 weeks ago. The problem is unchanged. The affected locations include the face. The rash is characterized by redness, scaling and draining. She was exposed to nothing. Pertinent negatives include no eye pain, fatigue, fever, rhinorrhea, shortness of breath or sore throat. Past treatments include antibiotic cream. The treatment provided no relief.   Depression Stable mood concerned about combination of trazodone, wellbutrin and cymbalta. All prescribed by different provider. Entered [facility] referral for clinical pharmacist to review medication list.   Hyperlipidemia Repeat lipid panel Maintain atorvastatin dose  H/O migraine Resolved to toradol injection, imitrex and trigger injection Nausea did not improved with zofran  Depression screen [facility] 2/9 12/24/2020 11/20/2020 06/26/2020  Decreased Interest 0 0 2  Down, Depressed, Hopeless 0 0 1  PHQ - 2 Score 0 0 3  Altered sleeping 0 - 1  Tired, decreased energy 0 - 2  Change in appetite 0 - 0  Feeling bad or failure about yourself  0 - 0  Trouble concentrating 0 - 1  Moving slowly or fidgety/restless 3 - 0  Suicidal thoughts 0 - 0  PHQ-9 Score 3 - 7  Difficult doing work/chores Somewhat difficult - Not difficult at all  Some recent data might be hidden   GAD 7 : Generalized Anxiety Score 12/24/2020 06/26/2020  Nervous, Anxious, on Edge 1 0  Control/stop worrying 0 0  Worry too much - different things 0 0  Trouble relaxing 0 1  Restless 1 0  Easily annoyed or irritable 0 1  Afraid - awful might happen 0 0  Total GAD 7 Score 2 2  Anxiety Difficulty Somewhat difficult Not difficult at all   BP Readings from Last 3 Encounters:  12/24/20 118/60  12/05/20 90/60  10/04/20 121/72   Wt Readings from Last 3  Encounters:  12/24/20 167 lb (75.8 kg)  12/05/20 168 lb (76.2 kg)  11/20/20 161 lb (73 kg)   Reviewed past Medical, Social and Family history today.  Outpatient Medications Prior to Visit  Medication Sig Dispense Refill  . atorvastatin (LIPITOR) 40 MG tablet Take 1 tablet (40 mg total) by mouth daily. 90 tablet 3  . diazepam (VALIUM) 5 MG tablet Take 1 tablet (5 mg total) by mouth 2 (two) times daily as needed. 60 tablet 2  . DULoxetine (CYMBALTA) 30 MG capsule Take 1 capsule (30 mg total) by mouth daily. 30 capsule 3  . oxyCODONE-acetaminophen (PERCOCET/ROXICET) 5-325 MG tablet Take 1 tablet by mouth 2 (two) times daily.    . SUMAtriptan (IMITREX) 50 MG tablet Take 1 tablet (50 mg total) by mouth once as needed for up to 1 dose for migraine (may repeat x 1 tab in 1-2hrs if headache persists). May repeat in 2 hours if headache persists or recurs. 10 tablet 2  . tiZANidine (ZANAFLEX) 4 MG tablet TAKE 1 TABLET BY MOUTH 4 TIMES A DAY AS NEEDED  0  . traZODone (DESYREL) 100 MG tablet TAKE 2 TABLETS (200MG ) BY MOUTH EVERY EVENING AT BEDTIME 180 tablet 1  . buPROPion (WELLBUTRIN XL) 300 MG 24 hr tablet Take 1 tablet by mouth once daily 90 tablet 0  . ondansetron (ZOFRAN) 4 MG tablet Take 1 tablet (4 mg total) by mouth every 8 (eight) hours as needed for  nausea or vomiting. 20 tablet 0   No facility-administered medications prior to visit.   ROS See HPI  Objective:  BP 118/60 (BP Location: Right Arm, Patient Position: Sitting, Cuff Size: Normal)   Pulse 74   Temp (!) 97.3 F (36.3 C) (Temporal)   Ht 5\' 6"  (1.676 m)   Wt 167 lb (75.8 kg)   SpO2 96%   BMI 26.95 kg/m   Physical Exam HENT:     Head:     Jaw: There is normal jaw occlusion.     Salivary Glands: Right salivary gland is not diffusely enlarged or tender. Left salivary gland is not diffusely enlarged or tender.      Right Ear: Tympanic membrane and ear canal normal.     Left Ear: Tympanic membrane and ear canal normal.      Nose: Nose normal.  Cardiovascular:     Rate and Rhythm: Normal rate and regular rhythm.     Pulses: Normal pulses.     Heart sounds: Normal heart sounds.  Pulmonary:     Effort: Pulmonary effort is normal.     Breath sounds: Normal breath sounds.  Musculoskeletal:     Right lower leg: No edema.     Left lower leg: No edema.  Skin:    Findings: Erythema and rash present.     Comments: Discrete erythematous scabs on face, earlobe and chin  Neurological:     Mental Status: She is alert and oriented to person, place, and time.  Psychiatric:        Mood and Affect: Mood normal.        Behavior: Behavior normal.        Thought Content: Thought content normal.    Assessment & Plan:  This visit occurred during the SARS-CoV-2 public health emergency.  Safety protocols were in place, including screening questions prior to the visit, additional usage of staff PPE, and extensive cleaning of exam room while observing appropriate contact time as indicated for disinfecting solutions.   Julie Neal was seen today for follow-up.  Diagnoses and all orders for this visit:  Pure hypercholesterolemia -     Hepatic function panel -     Basic metabolic panel -     Lipid panel  Recurrent major depressive disorder, in partial remission (HCC) -     Basic metabolic panel -     buPROPion (WELLBUTRIN XL) 300 MG 24 hr tablet; Take 1 tablet (300 mg total) by mouth daily. -     AMB Referral to [facility] Coordinaton  Rash of face -     mupirocin ointment (BACTROBAN) 2 %; Apply 1 application topically 2 (two) times daily. x7day  Polypharmacy -     AMB Referral to [place]  consider oral abx if no improvement with topical abx.  Problem List Items Addressed This Visit      Other   Depression    Stable mood concerned about combination of trazodone, wellbutrin and cymbalta. All prescribed by different provider. Entered [facility] referral for clinical pharmacist to review medication  list.       Relevant Medications   buPROPion (WELLBUTRIN XL) 300 MG 24 hr tablet   Other Relevant Orders   Basic metabolic panel (Completed)   [facility]B [facility]   Hyperlipidemia - Primary    Repeat lipid panel Maintain atorvastatin dose      Relevant Orders   Hepatic function panel (Completed)   Basic metabolic panel (Completed)   Lipid  panel (Completed)    Other Visit Diagnoses    Rash of face       Relevant Medications   mupirocin ointment (BACTROBAN) 2 %   Polypharmacy       Relevant Orders   AMB Referral to [facility] Coordinaton      Follow-up: Return in about 6 months (around 06/26/2021) for Depression and Hyperlipidemia (fasting).  Wilfred Lacy, NP

## 2020-12-24 NOTE — Patient Instructions (Signed)
Go to lab for blood draw  Call office if rash does not improvement in 1week with bactroban ointment.

## 2020-12-24 NOTE — Assessment & Plan Note (Signed)
Resolved to toradol injection, imitrex and trigger injection Nausea did not improved with zofran

## 2020-12-24 NOTE — Assessment & Plan Note (Addendum)
Stable mood concerned about combination of trazodone, wellbutrin and cymbalta. All prescribed by different provider. Entered [facility] referral for clinical pharmacist to review medication list.

## 2020-12-24 NOTE — Assessment & Plan Note (Signed)
Repeat lipid panel Maintain atorvastatin dose 

## 2020-12-31 ENCOUNTER — Telehealth: Payer: Self-pay | Admitting: Nurse Practitioner

## 2020-12-31 DIAGNOSIS — R21 Rash and other nonspecific skin eruption: Secondary | ICD-10-CM

## 2020-12-31 NOTE — Telephone Encounter (Signed)
Patient states that the medication she was prescribed for her face is not working. She said that it's not drying the bumps up and she now has more on her face. He wants to know what she should do. Please give her a call back at [phone] and advise.

## 2021-01-02 NOTE — Telephone Encounter (Signed)
Patient is calling back regarding previous message. Please advise.

## 2021-01-03 MED ORDER — SULFAMETHOXAZOLE-TRIMETHOPRIM 400-80 MG PO TABS: 1.0000 | ORAL_TABLET | Freq: Two times a day (BID) | ORAL | 0 refills | Status: DC

## 2021-01-03 NOTE — Telephone Encounter (Signed)
Patient notified and verbalized understanding. 

## 2021-01-11 ENCOUNTER — Other Ambulatory Visit: Payer: Self-pay | Admitting: Family Medicine

## 2021-01-11 DIAGNOSIS — F330 Major depressive disorder, recurrent, mild: Secondary | ICD-10-CM

## 2021-01-14 NOTE — Telephone Encounter (Signed)
Patient has transferred care to your office.  

## 2021-01-19 ENCOUNTER — Ambulatory Visit
Admission: RE | Admit: 2021-01-19 | Discharge: 2021-01-19 | Disposition: A | Discharge status: Home / Self Care | Payer: Medicare Other | Source: Ambulatory Visit | Attending: Nurse Practitioner | Admitting: Nurse Practitioner

## 2021-01-19 DIAGNOSIS — Z1231 Encounter for screening mammogram for malignant neoplasm of breast: Secondary | ICD-10-CM | POA: Diagnosis not present

## 2021-01-24 ENCOUNTER — Telehealth: Payer: Self-pay | Admitting: Nurse Practitioner

## 2021-01-24 NOTE — Telephone Encounter (Signed)
[facility] is calling to update the provider on patient. She states that patient was unable to do the cognitive test and only got 1 word correct out of 3. She also said that patient was unable to draw the clock. Please call her at [phone] if you have any questions.

## 2021-01-25 NOTE — Telephone Encounter (Signed)
Spoke to patient and schedule an appt for 01/29/21 @ 8:00 am.Dm/cma

## 2021-01-29 ENCOUNTER — Other Ambulatory Visit: Payer: Self-pay

## 2021-01-29 ENCOUNTER — Ambulatory Visit (INDEPENDENT_AMBULATORY_CARE_PROVIDER_SITE_OTHER): Payer: Medicare Other | Admitting: Nurse Practitioner

## 2021-01-29 ENCOUNTER — Encounter: Payer: Self-pay | Admitting: Nurse Practitioner

## 2021-01-29 VITALS — BP 112/78 | HR 79 | Temp 98.8°F | Ht 66.0 in | Wt 167.0 lb

## 2021-01-29 DIAGNOSIS — R739 Hyperglycemia, unspecified: Secondary | ICD-10-CM

## 2021-01-29 DIAGNOSIS — E119 Type 2 diabetes mellitus without complications: Secondary | ICD-10-CM | POA: Insufficient documentation

## 2021-01-29 DIAGNOSIS — R413 Other amnesia: Secondary | ICD-10-CM | POA: Diagnosis not present

## 2021-01-29 DIAGNOSIS — K224 Dyskinesia of esophagus: Secondary | ICD-10-CM | POA: Diagnosis not present

## 2021-01-29 LAB — POCT GLYCOSYLATED HEMOGLOBIN (HGB A1C): Hemoglobin A1C: 6.1 % — AB (ref 4.0–5.6)

## 2021-01-29 MED ORDER — DIAZEPAM 2 MG PO TABS: 2.0000 mg | ORAL_TABLET | Freq: Every day | ORAL | 2 refills | Status: DC | PRN

## 2021-01-29 NOTE — Assessment & Plan Note (Signed)
6.1% today Advised about need for low carb/low sugar diet. Repeat in 4months

## 2021-01-29 NOTE — Assessment & Plan Note (Signed)
Use of valium once a day Decreased dose 2mg  daily as needed

## 2021-01-29 NOTE — Patient Instructions (Addendum)
Normal memory screen, will repeat in 1year Valium dose decreased to 2mg  Use cane or walker at all times to prevent fall. HgbA1c of 6.1 indicates prediabetes: maintain DASH diet  DASH Eating Plan DASH stands for Dietary Approaches to Stop Hypertension. The DASH eating plan is a healthy eating plan that has been shown to: Reduce high blood pressure (hypertension). Reduce your risk for type 2 diabetes, heart disease, and stroke. Help with weight loss. What are tips for following this plan? Reading food labels Check food labels for the amount of salt (sodium) per serving. Choose foods with less than 5 percent of the Daily Value of sodium. Generally, foods with less than 300 milligrams (mg) of sodium per serving fit into this eating plan. To find whole grains, look for the word "whole" as the first word in the ingredient list. Shopping Buy products labeled as "low-sodium" or "no salt added." Buy fresh foods. Avoid canned foods and pre-made or frozen meals. Cooking Avoid adding salt when cooking. Use salt-free seasonings or herbs instead of table salt or sea salt. Check with your health care provider or pharmacist before using salt substitutes. Do not fry foods. Cook foods using healthy methods such as baking, boiling, grilling, roasting, and broiling instead. Cook with heart-healthy oils, such as olive, canola, avocado, soybean, or sunflower oil. Meal planning  Eat a balanced diet that includes: 4 or more servings of fruits and 4 or more servings of vegetables each day. Try to fill one-half of your plate with fruits and vegetables. 6-8 servings of whole grains each day. Less than 6 oz (170 g) of lean meat, poultry, or fish each day. A 3-oz (85-g) serving of meat is about the same size as a deck of cards. One egg equals 1 oz (28 g). 2-3 servings of low-fat dairy each day. One serving is 1 cup (237 mL). 1 serving of nuts, seeds, or beans 5 times each week. 2-3 servings of heart-healthy fats.  Healthy fats called omega-3 fatty acids are found in foods such as walnuts, flaxseeds, fortified milks, and eggs. These fats are also found in cold-water fish, such as sardines, salmon, and mackerel. Limit how much you eat of: Canned or prepackaged foods. Food that is high in trans fat, such as some fried foods. Food that is high in saturated fat, such as fatty meat. Desserts and other sweets, sugary drinks, and other foods with added sugar. Full-fat dairy products. Do not salt foods before eating. Do not eat more than 4 egg yolks a week. Try to eat at least 2 vegetarian meals a week. Eat more home-cooked food and less restaurant, buffet, and fast food.  Lifestyle When eating at a restaurant, ask that your food be prepared with less salt or no salt, if possible. If you drink alcohol: Limit how much you use to: 0-1 drink a day for women who are not pregnant. 0-2 drinks a day for men. Be aware of how much alcohol is in your drink. In the [place] one drink equals one 12 oz bottle of beer (355 mL), one 5 oz glass of wine (148 mL), or one 1 oz glass of hard liquor (44 mL). General information Avoid eating more than 2,300 mg of salt a day. If you have hypertension, you may need to reduce your sodium intake to 1,500 mg a day. Work with your health care provider to maintain a healthy body weight or to lose weight. Ask what an ideal weight is for you. Get at least 30 minutes  of exercise that causes your heart to beat faster (aerobic exercise) most days of the week. Activities may include walking, swimming, or biking. Work with your health care provider or dietitian to adjust your eating plan to your individual calorie needs. What foods should I eat? Fruits All fresh, dried, or frozen fruit. Canned fruit in natural juice (without addedsugar). Vegetables Fresh or frozen vegetables (raw, steamed, roasted, or grilled). Low-sodium or reduced-sodium tomato and vegetable juice. Low-sodium or  reduced-sodium tomatosauce and tomato paste. Low-sodium or reduced-sodium canned vegetables. Grains Whole-grain or whole-wheat bread. Whole-grain or whole-wheat pasta. Brown rice. [place]. Bulgur. Whole-grain and low-sodium cereals. Pita bread.Low-fat, low-sodium crackers. Whole-wheat flour tortillas. Meats and other proteins Skinless chicken or [place]. Ground chicken or [place]. Pork with fat trimmed off. Fish and seafood. Egg whites. Dried beans, peas, or lentils. Unsalted nuts, nut butters, and seeds. Unsalted canned beans. Lean cuts of beef with fat trimmed off. Low-sodium, lean precooked or cured meat, such as sausages or meatloaves. Dairy Low-fat (1%) or fat-free (skim) milk. Reduced-fat, low-fat, or fat-free cheeses. Nonfat, low-sodium ricotta or cottage cheese. Low-fat or nonfatyogurt. Low-fat, low-sodium cheese. Fats and oils Soft margarine without trans fats. Vegetable oil. Reduced-fat, low-fat, or light mayonnaise and salad dressings (reduced-sodium). Canola, safflower, olive, avocado, soybean, andsunflower oils. Avocado. Seasonings and condiments Herbs. Spices. Seasoning mixes without salt. Other foods Unsalted popcorn and pretzels. Fat-free sweets. The items listed above may not be a complete list of foods and beverages you can eat. Contact a dietitian for more information. What foods should I avoid? Fruits Canned fruit in a light or heavy syrup. Fried fruit. Fruit in cream or buttersauce. Vegetables Creamed or fried vegetables. Vegetables in a cheese sauce. Regular canned vegetables (not low-sodium or reduced-sodium). Regular canned tomato sauce and paste (not low-sodium or reduced-sodium). Regular tomato and vegetable juice(not low-sodium or reduced-sodium). Angie Fava. Olives. Grains Baked goods made with fat, such as croissants, muffins, or some breads. Drypasta or rice meal packs. Meats and other proteins Fatty cuts of meat. Ribs. Fried meat. Berniece Salines. Bologna, salami, and  other precooked or cured meats, such as sausages or meat loaves. Fat from the back of a pig (fatback). Bratwurst. Salted nuts and seeds. Canned beans with added salt. Canned orsmoked fish. Whole eggs or egg yolks. Chicken or [place] with skin. Dairy Whole or 2% milk, cream, and half-and-half. Whole or full-fat cream cheese. Whole-fat or sweetened yogurt. Full-fat cheese. Nondairy creamers. Whippedtoppings. Processed cheese and cheese spreads. Fats and oils Butter. Stick margarine. Lard. Shortening. Ghee. Bacon fat. Tropical oils, suchas coconut, palm kernel, or palm oil. Seasonings and condiments Onion salt, garlic salt, seasoned salt, table salt, and sea salt. Worcestershire sauce. Tartar sauce. Barbecue sauce. Teriyaki sauce. Soy sauce, including reduced-sodium. Steak sauce. Canned and packaged gravies. Fish sauce. Oyster sauce. Cocktail sauce. Store-bought horseradish. Ketchup. Mustard. Meat flavorings and tenderizers. Bouillon cubes. Hot sauces. Pre-made or packaged marinades. Pre-made or packaged taco seasonings. Relishes. Regular saladdressings. Other foods Salted popcorn and pretzels. The items listed above may not be a complete list of foods and beverages you should avoid. Contact a dietitian for more information. Where to find more information National Heart, Lung, and Blood Institute: https://wilson-eaton.com/ American Heart Association: www.heart.org Academy of Nutrition and Dietetics: www.eatright.[place]: www.kidney.org Summary The DASH eating plan is a healthy eating plan that has been shown to reduce high blood pressure (hypertension). It may also reduce your risk for type 2 diabetes, heart disease, and stroke. When on the DASH eating plan, aim  to eat more fresh fruits and vegetables, whole grains, lean proteins, low-fat dairy, and heart-healthy fats. With the DASH eating plan, you should limit salt (sodium) intake to 2,300 mg a day. If you have hypertension, you may  need to reduce your sodium intake to 1,500 mg a day. Work with your health care provider or dietitian to adjust your eating plan to your individual calorie needs. This information is not intended to replace advice given to you by your health care provider. Make sure you discuss any questions you have with your healthcare provider. Document Revised: 07/01/2019 Document Reviewed: 07/01/2019 Elsevier Patient Education  2022 [employer].

## 2021-01-29 NOTE — Assessment & Plan Note (Signed)
MMSE score of 28/30 today. Lives with her sister and brother in law. Independent with ADLs and medication management

## 2021-01-29 NOTE — Progress Notes (Signed)
Subjective:  Patient ID: Julie Neal, female    DOB: 11-07-1955  Age: 65 y.o. MRN: [id number]  CC: Follow-up (Memory and balance issues)  HPI  Esophageal spasm Use of valium once a day Decreased dose 2mg  daily as needed  Memory loss MMSE score of 28/30 today. Lives with her sister and brother in law. Independent with ADLs and medication management  Hyperglycemia 6.1% today Advised about need for low carb/low sugar diet. Repeat in 44months Fall:  2-3week ago at home, trying to make up her bed and tripped on sheets. Frequent falls from  MMSE - Mini Mental State Exam 01/29/2021  Orientation to time 5  Orientation to Place 5  Registration 3  Attention/ Calculation 3  Recall 3  Language- name 2 objects 2  Language- repeat 1  Language- follow 3 step command 3  Language- read & follow direction 1  Write a sentence 1  Copy design 1  Total score 28   Reviewed past Medical, Social and Family history today.  Outpatient Medications Prior to Visit  Medication Sig Dispense Refill   atorvastatin (LIPITOR) 40 MG tablet Take 1 tablet (40 mg total) by mouth daily. 90 tablet 3   buPROPion (WELLBUTRIN XL) 300 MG 24 hr tablet Take 1 tablet (300 mg total) by mouth daily. 90 tablet 3   DULoxetine (CYMBALTA) 30 MG capsule Take 1 capsule (30 mg total) by mouth daily. 30 capsule 3   mupirocin ointment (BACTROBAN) 2 % Apply 1 application topically 2 (two) times daily. x7day 15 g 0   oxyCODONE-acetaminophen (PERCOCET/ROXICET) 5-325 MG tablet Take 1 tablet by mouth 2 (two) times daily.     SUMAtriptan (IMITREX) 50 MG tablet Take 1 tablet (50 mg total) by mouth once as needed for up to 1 dose for migraine (may repeat x 1 tab in 1-2hrs if headache persists). May repeat in 2 hours if headache persists or recurs. 10 tablet 2   tiZANidine (ZANAFLEX) 4 MG tablet TAKE 1 TABLET BY MOUTH 4 TIMES A DAY AS NEEDED  0   traZODone (DESYREL) 100 MG tablet TAKE 2 TABLETS BY MOUTH IN THE EVENING AT BEDTIME 180  tablet 0   diazepam (VALIUM) 5 MG tablet Take 1 tablet (5 mg total) by mouth 2 (two) times daily as needed. 60 tablet 2   sulfamethoxazole-trimethoprim (BACTRIM) 400-80 MG tablet Take 1 tablet by mouth 2 (two) times daily. 14 tablet 0   No facility-administered medications prior to visit.    ROS See HPI  Objective:  BP 112/78   Pulse 79   Temp 98.8 F (37.1 C)   Ht 5\' 6"  (1.676 m)   Wt 167 lb (75.8 kg)   SpO2 97%   BMI 26.95 kg/m   Physical Exam Cardiovascular:     Rate and Rhythm: Normal rate and regular rhythm.     Pulses: Normal pulses.     Heart sounds: Normal heart sounds.  Pulmonary:     Effort: Pulmonary effort is normal.     Breath sounds: Normal breath sounds.  Neurological:     Mental Status: She is alert and oriented to person, place, and time.     Gait: Gait abnormal.     Comments: Ambulates with walker    Assessment & Plan:  This visit occurred during the SARS-CoV-2 public health emergency.  Safety protocols were in place, including screening questions prior to the visit, additional usage of staff PPE, and extensive cleaning of exam room while observing appropriate contact time as  indicated for disinfecting solutions.   Rakiya was seen today for follow-up.  Diagnoses and all orders for this visit:  Hyperglycemia -     POCT glycosylated hemoglobin (Hb A1C)  Memory loss  Esophageal spasm -     diazepam (VALIUM) 2 MG tablet; Take 1 tablet (2 mg total) by mouth daily as needed.   Problem List Items Addressed This Visit       Digestive   Esophageal spasm    Use of valium once a day Decreased dose 2mg  daily as needed       Relevant Medications   diazepam (VALIUM) 2 MG tablet     Other   Hyperglycemia - Primary    6.1% today Advised about need for low carb/low sugar diet. Repeat in 14months       Relevant Orders   POCT glycosylated hemoglobin (Hb A1C) (Completed)   Memory loss    MMSE score of 28/30 today. Lives with her sister and  brother in law. Independent with ADLs and medication management        Follow-up: No follow-ups on file.  Wilfred Lacy, NP

## 2021-02-05 DIAGNOSIS — M47812 Spondylosis without myelopathy or radiculopathy, cervical region: Secondary | ICD-10-CM | POA: Diagnosis not present

## 2021-02-05 DIAGNOSIS — M62830 Muscle spasm of back: Secondary | ICD-10-CM | POA: Diagnosis not present

## 2021-02-05 DIAGNOSIS — M47817 Spondylosis without myelopathy or radiculopathy, lumbosacral region: Secondary | ICD-10-CM | POA: Diagnosis not present

## 2021-02-05 DIAGNOSIS — G894 Chronic pain syndrome: Secondary | ICD-10-CM | POA: Diagnosis not present

## 2021-02-13 ENCOUNTER — Telehealth: Payer: Self-pay

## 2021-02-13 DIAGNOSIS — F419 Anxiety disorder, unspecified: Secondary | ICD-10-CM

## 2021-02-13 DIAGNOSIS — K224 Dyskinesia of esophagus: Secondary | ICD-10-CM

## 2021-02-13 MED ORDER — DIAZEPAM 5 MG PO TABS: 5.0000 mg | ORAL_TABLET | Freq: Every day | ORAL | 0 refills | Status: DC | PRN

## 2021-02-13 NOTE — Telephone Encounter (Signed)
I tried to schedule pt on July 20th at 2pm, but it will not allow me to do so.  Pt need to be seen in 2 weeks due to medication discussion, per Dr. Loletha Grayer.  Please schedule pt. Thank you.

## 2021-02-13 NOTE — Telephone Encounter (Signed)
I called pt back and she informed me that her throat feels like it is getting tighter and tighter.  Pt started Valium 2 mg tablet in June, changing from a 5mg  tablet.  She explains that since the change of dosage, it feels like her throat is tightening.  I informed pt that I would send her message to the doc of the day and let her know what is suggested.

## 2021-02-13 NOTE — Telephone Encounter (Signed)
Julie Neal had lowered pts valium dosage to 2mg . Pt said she feels like she is "starting to choke" since the med change. Please advise

## 2021-02-13 NOTE — Telephone Encounter (Signed)
Rx sent for 14 tabs of 5mg  valium. Pt needs to schedule f/u with PCP to discuss

## 2021-02-19 NOTE — Telephone Encounter (Signed)
I spoke with pt and she did decide to be seen sooner.  She is now scheduled on the 20th at 11am.

## 2021-02-19 NOTE — Telephone Encounter (Signed)
[place] scheduled pt an appt on 7/7 for 7/27 w/[place]. Does pt need to be seen sooner? The only thing we can use 7/20 with Dr. Loletha Grayer is at 11:00 & you should be able to schedule in that slot.

## 2021-02-22 ENCOUNTER — Other Ambulatory Visit: Payer: Self-pay

## 2021-02-22 ENCOUNTER — Encounter: Payer: Self-pay | Admitting: Family Medicine

## 2021-02-22 ENCOUNTER — Ambulatory Visit (INDEPENDENT_AMBULATORY_CARE_PROVIDER_SITE_OTHER): Payer: Medicare Other | Admitting: Family Medicine

## 2021-02-22 VITALS — BP 100/70 | HR 73 | Temp 97.0°F | Ht 66.0 in | Wt 170.8 lb

## 2021-02-22 DIAGNOSIS — K224 Dyskinesia of esophagus: Secondary | ICD-10-CM | POA: Diagnosis not present

## 2021-02-22 DIAGNOSIS — G43719 Chronic migraine without aura, intractable, without status migrainosus: Secondary | ICD-10-CM

## 2021-02-22 DIAGNOSIS — F419 Anxiety disorder, unspecified: Secondary | ICD-10-CM | POA: Diagnosis not present

## 2021-02-22 MED ORDER — DIAZEPAM 5 MG PO TABS: 5.0000 mg | ORAL_TABLET | Freq: Two times a day (BID) | ORAL | 2 refills | Status: DC | PRN

## 2021-02-22 MED ORDER — KETOROLAC TROMETHAMINE 60 MG/2ML IM SOLN
60.0000 mg | Freq: Once | INTRAMUSCULAR | Status: AC
  Administered 2021-02-22: 60 mg via INTRAMUSCULAR

## 2021-02-22 MED ORDER — PROMETHAZINE HCL 25 MG PO TABS: 25.0000 mg | ORAL_TABLET | Freq: Three times a day (TID) | ORAL | 1 refills | Status: DC | PRN

## 2021-02-22 NOTE — Progress Notes (Signed)
Julie Neal is a 65 y.o. female  Chief Complaint  Patient presents with   Migraine    Pt c/o having migraines, pt explains that she is now taking 2mg  of volume and it makes her feel like she has to choke.  Pt would like to get back on 5mg .      HPI: KENNEDY BRINES is a 65 y.o. female patient of my colleague Wilfred Lacy who today states she is not doing well on 2mg  BID of valium. She had been on 5mg  BID and pt felt symptoms were well-controlled, managed without side effects. Had been taking it x 15 years since she had cervical spine surgery.  Now food is "slower going down" and having more pain. She states it feels like her throat is closing when she bends forward. Food is not getting stuck. No trouble breathing.   She is having migraine headache on Lt side. + nausea. Migraines are not new for pt and this is the same as her typical symptoms. She requests Rx nausea that is not zofran - states that did not work.    Past Medical History:  Diagnosis Date   Anxiety    Asymptomatic stenosis of right carotid artery    1-30%   Cervicalgia    Depression    Fibromyalgia    Fracture of ankle, bimalleolar, left, closed 21/30/8657   Helicobacter pylori gastritis 2016/11/06   Hyperlipidemia    Left leg weakness    Lumbago    Memory loss    Migraines    Neuralgic migraines    Osteoporosis     Past Surgical History:  Procedure Laterality Date   ABDOMINAL HYSTERECTOMY     BACK SURGERY     HAND SURGERY Bilateral    LEG SURGERY Bilateral    NECK SURGERY     SHOULDER SURGERY Left     Social History   Socioeconomic History   Marital status: Widowed    Spouse name: [name]   Number of children: 1   Years of education: college   Highest education level: Not on file  Occupational History   Occupation: Retired    Comment: disabled  Tobacco Use   Smoking status: Former    Types: Cigarettes   Smokeless tobacco: Never   Tobacco comments:    Quit in [place] Use:  Former  Substance and Sexual Activity   Alcohol use: No    Alcohol/week: 0.0 standard drinks   Drug use: No   Sexual activity: Not on file  Other Topics Concern   Not on file  Social History Narrative   Has a college education. Husband died 11-07-2019 (pulmonary fibrosis).  Patient has one child.   Patient is disabled. Right handed.   Caffeine one cup of coffee.   Social Determinants of Health   Financial Resource Strain: Low Risk    Difficulty of Paying Living Expenses: Not hard at all  Food Insecurity: No Food Insecurity   Worried About Charity fundraiser in the Last Year: Never true   [place] in the Last Year: Never true  Transportation Needs: No Transportation Needs   Lack of Transportation (Medical): No   Lack of Transportation (Non-Medical): No  Physical Activity: Sufficiently Active   Days of Exercise per Week: 7 days   Minutes of Exercise per Session: 30 min  Stress: No Stress Concern Present   Feeling of Stress : Not at all  Social Connections: Moderately  Isolated   Frequency of Communication with Friends and Family: More than three times a week   Frequency of Social Gatherings with Friends and Family: More than three times a week   Attends Religious Services: More than 4 times per year   Active Member of [employer] or Organizations: No   Attends Archivist Meetings: Never   Marital Status: Widowed  Human resources officer Violence: Not At Risk   Fear of Current or Ex-Partner: No   Emotionally Abused: No   Physically Abused: No   Sexually Abused: No    Family History  Problem Relation Age of Onset   Heart attack Father    Heart disease Father 65   Alzheimer's disease Mother    Stroke Mother    Diabetes type II Brother    Breast cancer Sister      Immunization History  Administered Date(s) Administered   Fluad Quad(high Dose 65+) 06/26/2020   Influenza, High Dose Seasonal PF 05/31/2018   Influenza,inj,Quad PF,6+ Mos 07/18/2014, 07/23/2015, 05/23/2016,  05/15/2017   Influenza-Unspecified 05/12/2019   PFIZER(Purple Top)SARS-COV-2 Vaccination 11/10/2019, 12/05/2019, 06/11/2020   Tdap 07/18/2014, 05/16/2016    Outpatient Encounter Medications as of 02/22/2021  Medication Sig Note   atorvastatin (LIPITOR) 40 MG tablet Take 1 tablet (40 mg total) by mouth daily.    buPROPion (WELLBUTRIN XL) 300 MG 24 hr tablet Take 1 tablet (300 mg total) by mouth daily.    DULoxetine (CYMBALTA) 30 MG capsule Take 1 capsule (30 mg total) by mouth daily.    mupirocin ointment (BACTROBAN) 2 % Apply 1 application topically 2 (two) times daily. x7day    naloxone (NARCAN) nasal spray 4 mg/0.1 mL SMARTSIG:Spray(s) In Nostril    oxyCODONE-acetaminophen (PERCOCET) 10-325 MG tablet Take 1 tablet by mouth every 6 (six) hours as needed.    promethazine (PHENERGAN) 25 MG tablet Take 1 tablet (25 mg total) by mouth every 8 (eight) hours as needed for nausea or vomiting.    SUMAtriptan (IMITREX) 50 MG tablet Take 1 tablet (50 mg total) by mouth once as needed for up to 1 dose for migraine (may repeat x 1 tab in 1-2hrs if headache persists). May repeat in 2 hours if headache persists or recurs.    tiZANidine (ZANAFLEX) 4 MG tablet TAKE 1 TABLET BY MOUTH 4 TIMES A DAY AS NEEDED    traZODone (DESYREL) 100 MG tablet TAKE 2 TABLETS BY MOUTH IN THE EVENING AT BEDTIME    [DISCONTINUED] diazepam (VALIUM) 2 MG tablet Take 1 tablet (2 mg total) by mouth daily as needed.    [DISCONTINUED] oxyCODONE-acetaminophen (PERCOCET/ROXICET) 5-325 MG tablet Take 1 tablet by mouth 2 (two) times daily. 12/26/2019: Taking differently-10-325 mg every 6 hours   diazepam (VALIUM) 5 MG tablet Take 1 tablet (5 mg total) by mouth every 12 (twelve) hours as needed for anxiety or muscle spasms (esophageal spasm).    [DISCONTINUED] diazepam (VALIUM) 5 MG tablet Take 1 tablet (5 mg total) by mouth daily as needed for anxiety. (Patient not taking: Reported on 02/22/2021)    Facility-Administered Encounter  Medications as of 02/22/2021  Medication   ketorolac (TORADOL) injection 60 mg     ROS: Pertinent positives and negatives noted in HPI. Remainder of ROS non-contributory    Allergies  Allergen Reactions   Mobic [Meloxicam] Anaphylaxis   Penicillins Anaphylaxis   Nubain [Nalbuphine Hcl] Diarrhea and Nausea And Vomiting    BP 100/70 (BP Location: Left Arm, Patient Position: Sitting, Cuff Size: Normal)   Pulse 73  Temp (!) 97 F (36.1 C) (Temporal)   Ht 5\' 6"  (1.676 m)   Wt 170 lb 12.8 oz (77.5 kg)   SpO2 94%   BMI 27.57 kg/m  Wt Readings from Last 3 Encounters:  02/22/21 170 lb 12.8 oz (77.5 kg)  01/29/21 167 lb (75.8 kg)  12/24/20 167 lb (75.8 kg)   Temp Readings from Last 3 Encounters:  02/22/21 (!) 97 F (36.1 C) (Temporal)  01/29/21 98.8 F (37.1 C)  12/24/20 (!) 97.3 F (36.3 C) (Temporal)   BP Readings from Last 3 Encounters:  02/22/21 100/70  01/29/21 112/78  12/24/20 118/60   Pulse Readings from Last 3 Encounters:  02/22/21 73  01/29/21 79  12/24/20 74     Physical Exam Constitutional:      General: She is not in acute distress. Musculoskeletal:     Cervical back: Normal range of motion.  Lymphadenopathy:     Cervical: No cervical adenopathy.  Neurological:     General: No focal deficit present.     Mental Status: She is alert and oriented to person, place, and time.  Psychiatric:        Mood and Affect: Mood normal.        Behavior: Behavior normal.     A/P:  1. Anxiety 2. Esophageal spasm - pt states she has been on valium 5mg  BID x 15 years since she had cervical spine surgery - she was not aware PCP was going to decrease to 2mg  daily PRN, however this is documented in OV note from 01/29/21 - pt states her throat and neck feel tight and 2mg  BID is not effective at all Resume: - diazepam (VALIUM) 5 MG tablet; Take 1 tablet (5 mg total) by mouth every 12 (twelve) hours as needed for anxiety or muscle spasms (esophageal spasm).   Dispense: 60 tablet; Refill: 2 - database reviewed and appropriate - f/u with PCP in 3 mo to re-eval/assess  3. Intractable chronic migraine without aura and without status migrainosus - chronic issue Rx: - ketorolac (TORADOL) injection 60 mg - promethazine (PHENERGAN) 25 MG tablet; Take 1 tablet (25 mg total) by mouth every 8 (eight) hours as needed for nausea or vomiting.  Dispense: 30 tablet; Refill: 1 Discussed plan and reviewed medications with patient, including risks, benefits, and potential side effects. Pt expressed understand. All questions answered.   This visit occurred during the SARS-CoV-2 public health emergency.  Safety protocols were in place, including screening questions prior to the visit, additional usage of staff PPE, and extensive cleaning of exam room while observing appropriate contact time as indicated for disinfecting solutions.

## 2021-02-27 ENCOUNTER — Ambulatory Visit: Payer: Medicare Other | Admitting: Family Medicine

## 2021-03-06 ENCOUNTER — Ambulatory Visit: Payer: Medicare Other | Admitting: Nurse Practitioner

## 2021-03-06 DIAGNOSIS — M62830 Muscle spasm of back: Secondary | ICD-10-CM | POA: Diagnosis not present

## 2021-03-06 DIAGNOSIS — M47817 Spondylosis without myelopathy or radiculopathy, lumbosacral region: Secondary | ICD-10-CM | POA: Diagnosis not present

## 2021-03-06 DIAGNOSIS — M47812 Spondylosis without myelopathy or radiculopathy, cervical region: Secondary | ICD-10-CM | POA: Diagnosis not present

## 2021-03-06 DIAGNOSIS — G894 Chronic pain syndrome: Secondary | ICD-10-CM | POA: Diagnosis not present

## 2021-03-12 ENCOUNTER — Telehealth: Payer: Self-pay | Admitting: Nurse Practitioner

## 2021-03-12 NOTE — Chronic Care Management (AMB) (Signed)
  Chronic Care Management   Outreach Note  03/12/2021 Name: Julie Neal MRN: [id number] DOB: [birth year]  Referred by: Flossie Buffy, NP Reason for referral : No chief complaint on file.   An unsuccessful telephone outreach was attempted today. The patient was referred to the pharmacist for assistance with care management and care coordination.   Follow Up Plan:   Tatjana Dellinger Upstream Scheduler

## 2021-03-12 NOTE — Chronic Care Management (AMB) (Signed)
  Chronic Care Management   Note  03/12/2021 Name: Julie Neal MRN: [id number] DOB: [birth year]  Julie Neal is a 65 y.o. year old female who is a primary care patient of Nche, Charlene Brooke, NP. I reached out to Julie Neal by phone today in response to a referral sent by Julie Neal's PCP, Nche, Charlene Brooke, NP.   Julie Neal was given information about Chronic Care Management services today including:  CCM service includes personalized support from designated clinical staff supervised by her physician, including individualized plan of care and coordination with other care providers 24/7 contact phone numbers for assistance for urgent and routine care needs. Service will only be billed when office clinical staff spend 20 minutes or more in a month to coordinate care. Only one practitioner may furnish and bill the service in a calendar month. The patient may stop CCM services at any time (effective at the end of the month) by phone call to the office staff.   Patient agreed to services and verbal consent obtained.   Follow up plan:   Tatjana Secretary/administrator

## 2021-03-16 ENCOUNTER — Other Ambulatory Visit: Payer: Self-pay | Admitting: Family Medicine

## 2021-03-16 DIAGNOSIS — F3341 Major depressive disorder, recurrent, in partial remission: Secondary | ICD-10-CM

## 2021-03-28 ENCOUNTER — Telehealth: Payer: Self-pay | Admitting: Nurse Practitioner

## 2021-03-28 DIAGNOSIS — G43719 Chronic migraine without aura, intractable, without status migrainosus: Secondary | ICD-10-CM

## 2021-03-28 NOTE — Telephone Encounter (Signed)
What is the name of the medication? SUMAtriptan (IMITREX) 50 MG tablet [id number]   Have you contacted your pharmacy to request a refill? [facility] called requesting refill for pt.   Which pharmacy would you like this sent to?  [facility] [place] [facility] [place] [address]  [id number] [place] [address]  Phone:  [phone]  Fax:  [phone]  DEA #:  --  DAW Reason: --    Patient notified that their request is being sent to the clinical staff for review and that they should receive a call once it is complete. If they do not receive a call within 72 hours they can check with their pharmacy or our office.

## 2021-04-02 MED ORDER — SUMATRIPTAN SUCCINATE 50 MG PO TABS: 50.0000 mg | ORAL_TABLET | Freq: Once | ORAL | 2 refills | Status: AC | PRN

## 2021-04-02 NOTE — Telephone Encounter (Signed)
Last seen 02/22/21 Next appt 06/26/21 Chart supports Rx

## 2021-04-02 NOTE — Telephone Encounter (Signed)
Pt called to follow up on this, please advise

## 2021-04-04 ENCOUNTER — Telehealth: Payer: Self-pay

## 2021-04-04 DIAGNOSIS — M47817 Spondylosis without myelopathy or radiculopathy, lumbosacral region: Secondary | ICD-10-CM | POA: Diagnosis not present

## 2021-04-04 DIAGNOSIS — M47812 Spondylosis without myelopathy or radiculopathy, cervical region: Secondary | ICD-10-CM | POA: Diagnosis not present

## 2021-04-04 DIAGNOSIS — M62830 Muscle spasm of back: Secondary | ICD-10-CM | POA: Diagnosis not present

## 2021-04-04 DIAGNOSIS — G894 Chronic pain syndrome: Secondary | ICD-10-CM | POA: Diagnosis not present

## 2021-04-04 NOTE — Telephone Encounter (Signed)
PA for Sumatriptan Succinate 50 mg tabs submitted to [facility] [facility] through cover my meds.  Awaiting response.  Dm/cma  (Key: [id number]

## 2021-04-05 NOTE — Telephone Encounter (Signed)
Received additional information need for PA. Filled out it and then faxed this back to [place] [facility] at [phone]. Waiting on response. Dm/cma

## 2021-04-08 NOTE — Telephone Encounter (Signed)
No PA needed per [facility] due to medication is on your plans list of covered drugs.  Dm/cma

## 2021-04-17 ENCOUNTER — Other Ambulatory Visit: Payer: Self-pay | Admitting: Nurse Practitioner

## 2021-04-17 DIAGNOSIS — F330 Major depressive disorder, recurrent, mild: Secondary | ICD-10-CM

## 2021-04-18 ENCOUNTER — Telehealth: Payer: Self-pay | Admitting: Nurse Practitioner

## 2021-04-18 ENCOUNTER — Telehealth: Payer: Self-pay

## 2021-04-18 DIAGNOSIS — M79604 Pain in right leg: Secondary | ICD-10-CM | POA: Diagnosis not present

## 2021-04-18 DIAGNOSIS — M79671 Pain in right foot: Secondary | ICD-10-CM | POA: Diagnosis not present

## 2021-04-18 DIAGNOSIS — M25561 Pain in right knee: Secondary | ICD-10-CM | POA: Diagnosis not present

## 2021-04-18 NOTE — Progress Notes (Signed)
Chronic Care Management Pharmacy Assistant   Name: Julie Neal  MRN: [id number] DOB: 1955-09-19  Chart Review for clinical pharmacist on 04/22/2021.   Conditions to be addressed/monitored: HLD, Anxiety, Depression, and Migraines, Asymptomatic stenosis of right carotid artery, Esophageal spasm,Low back pain, Scoliosis deformity spine, Memory loss, Lumbago, Firbomyslgia, Hyperglycemia.   Primary concerns for visit include: Balance issue Rash on her face   Recent office visits:  02/22/2021 Letta Median DO (PCP Office)Increase diazepam (VALIUM) to  5 MG tablet; Take 1 tablet (5 mg total) by mouth every 12 (twelve) hours as needed for anxiety or muscle spasms ,ketorolac (TORADOL) injection 60 mg given, start Promethazine HCI 25 mg PRN 02/13/2021 Letta Median DO (PCP Office) sent in 14 tablets of 5 mg Valium 01/29/2021 Wilfred Lacy NP (PCP) decrease diazepam to 2 mg, take 1 tablet daily PRN, A1C was 6.1% 12/31/2020 Wilfred Lacy NP (PCP) Stop Bactroban ointment, Start oral bactrim 400-80 mg 2 times daily, referral to dermatology 12/24/2020 Wilfred Lacy NP (PCP) AMB Referral to [facility] Coordination,start Mupirocin 2% for 7 days 12/05/2020 Letta Median DO (PCP Office) start Zofran 4 mg PRN ,start Sumatriptan 50 mg PRN, Ketorolac Tromethamine 60 mg  given  11/20/2020 Caroleen Hamman LPN (PCP Office) Mammogram Ordered, No medication changes noted  Recent consult visits:  12/14/2020 Margaretha Sheffield MD (Physical Medicine and Rehabilitation) Unable to see note 12/11/2020 Margaretha Sheffield MD (Physical Medicine and Rehabilitation) Unable to see note 11/13/2020 Margaretha Sheffield MD (Physical Medicine and Rehabilitation) Unable to see note 11/05/2020 Dr.Yan MD (Neurology) Procedure completed, NCV with EMG, Start Cymbalta 30 mg daily, Refer to physical therapy  Hospital visits:  None in previous 6 months  Have you seen any other providers since your last visit? no Any changes in  your medications or health? no Any side effects from any medications? no Do you have an symptoms or problems not managed by your medications? no Any concerns about your health right now? Yes  Patient states she had a fall recently hurting her right leg.Patient reports not having good balance. Patient states she has an appointment coming up with the orthopedic.Patient is unsure if she broken a bone in her right leg.  Patient reports her "staph infection is back on her face".Patient inform me she call her PCP office 3 weeks ago to request a refill for her antibiotic, but never heard anything since.Patient states she is unsure the name of the antibiotic, but it is the last one her PCP prescribe "not the ointment". I informed patient I would reach out to the PCP office to ask if she can get a refill, but she may need appointment.Patient Verbalized understanding. Has your provider asked that you check blood pressure, blood sugar, or follow special diet at home? Yes  Patient states she checks her blood pressure at home using her iphone.  Do you get any type of exercise on a regular basis? Yes  Patient reports exercising daily before falling hurting her leg. Can you think of a goal you would like to reach for your health? None ID Do you have any problems getting your medications? no Is there anything that you would like to discuss during the appointment?   Balance issue  Rash on her face    Please bring medications and supplements to appointment  I reach out to patient PCP office to ask if she is able to get a refill of a antibiotic.I inform patient PCP office, patient is not sure of the medication name,but it  is the last antibiotic that was fill "not the ointment".I did inform the PCP office that the patient is aware she may need a visit prior to a refill.  Per PCP office, she will send the request to the provider nurse,and if she needs a appointment they will contact the patient to schedule  one.  Medications: Outpatient Encounter Medications as of 04/18/2021  Medication Sig   atorvastatin (LIPITOR) 40 MG tablet Take 1 tablet (40 mg total) by mouth daily.   buPROPion (WELLBUTRIN XL) 300 MG 24 hr tablet Take 1 tablet by mouth once daily   diazepam (VALIUM) 5 MG tablet Take 1 tablet (5 mg total) by mouth every 12 (twelve) hours as needed for anxiety or muscle spasms (esophageal spasm).   DULoxetine (CYMBALTA) 30 MG capsule Take 1 capsule (30 mg total) by mouth daily.   mupirocin ointment (BACTROBAN) 2 % Apply 1 application topically 2 (two) times daily. x7day   naloxone (NARCAN) nasal spray 4 mg/0.1 mL SMARTSIG:Spray(s) In Nostril   oxyCODONE-acetaminophen (PERCOCET) 10-325 MG tablet Take 1 tablet by mouth every 6 (six) hours as needed.   promethazine (PHENERGAN) 25 MG tablet Take 1 tablet (25 mg total) by mouth every 8 (eight) hours as needed for nausea or vomiting.   SUMAtriptan (IMITREX) 50 MG tablet Take 1 tablet (50 mg total) by mouth once as needed for up to 1 dose for migraine (may repeat x 1 tab in 1-2hrs if headache persists). May repeat in 2 hours if headache persists or recurs.   tiZANidine (ZANAFLEX) 4 MG tablet TAKE 1 TABLET BY MOUTH 4 TIMES A DAY AS NEEDED   traZODone (DESYREL) 100 MG tablet TAKE 2 TABLETS BY MOUTH IN THE EVENING AT BEDTIME   No facility-administered encounter medications on file as of 04/18/2021.    Care Gaps: Shingrix Vaccine PNA Vaccine COVID-19 Vaccine Influenza Vaccine Star Rating Drugs: Atorvastatin 40 mg last filled 03/12/2021 for 90 day supply at [facility]. Medication Fill Gaps: None ID  Anderson Malta Clinical Pharmacist Assistant [phone]

## 2021-04-18 NOTE — Telephone Encounter (Signed)
Patient needs appointment, it can be virtual or in person and with any provider available if Julie Neal does not have room on her schedule.

## 2021-04-18 NOTE — Telephone Encounter (Signed)
I called pt and left a voice message that she will need an appointment for this issue. I informed her to give [place] a call back to schedule this for her. She does not have Mychart.

## 2021-04-18 NOTE — Telephone Encounter (Signed)
Pt was speaking with Alex's assistant needing a antibiotic  script written for a staph infection on her face. I informed her, the pt may need an appointment for this issue. Please advise

## 2021-04-19 ENCOUNTER — Telehealth: Payer: Self-pay | Admitting: Nurse Practitioner

## 2021-04-19 NOTE — Telephone Encounter (Signed)
Pt requesting refill for traZODone

## 2021-04-19 NOTE — Telephone Encounter (Signed)
Chart supports rx refill Last ov: 01/29/2021

## 2021-04-22 ENCOUNTER — Other Ambulatory Visit: Payer: Self-pay

## 2021-04-22 ENCOUNTER — Ambulatory Visit (INDEPENDENT_AMBULATORY_CARE_PROVIDER_SITE_OTHER): Payer: Medicare Other

## 2021-04-22 DIAGNOSIS — E7800 Pure hypercholesterolemia, unspecified: Secondary | ICD-10-CM

## 2021-04-22 DIAGNOSIS — F419 Anxiety disorder, unspecified: Secondary | ICD-10-CM

## 2021-04-22 DIAGNOSIS — F3341 Major depressive disorder, recurrent, in partial remission: Secondary | ICD-10-CM

## 2021-04-22 NOTE — Progress Notes (Signed)
Chronic Care Management Pharmacy Note  05/10/2021 Name:  Julie Neal MRN:  [id number] DOB:  12-11-1955  Summary: Patient presents for Initial CCM Consult. 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 She was interested in counseling.   Recommendations/Changes made from today's visit: Provided information for [place].   Plan: CPP follow-up 6 months   Subjective: Julie Neal is an 65 y.o. year old female who is a primary patient of Nche, Charlene Brooke, NP.  The CCM team was consulted for assistance with disease management and care coordination needs.    Engaged with patient by telephone for initial visit in response to provider referral for pharmacy case management and/or care coordination services.   Consent to Services:  The patient was given the following information about Chronic Care Management services today, agreed to services, and gave verbal consent: 1. CCM service includes personalized support from designated clinical staff supervised by the primary care provider, including individualized plan of care and coordination with other care providers 2. 24/7 contact phone numbers for assistance for urgent and routine care needs. 3. Service will only be billed when office clinical staff spend 20 minutes or more in a month to coordinate care. 4. Only one practitioner may furnish and bill the service in a calendar month. 5.The patient may stop CCM services at any time (effective at the end of the month) by phone call to the office staff. 6. The patient will be responsible for cost sharing (co-pay) of up to 20% of the service fee (after annual deductible is met). Patient agreed to services and consent obtained.  Patient Care Team: Nche, Charlene Brooke, NP as PCP - General (Internal Medicine) Melina Schools, MD as Referring Physician (Orthopedic Surgery) Germaine Pomfret, [facility] as Pharmacist (Pharmacist)  Recent office visits: 02/22/2021  Letta Median DO (PCP Office)Increase diazepam (VALIUM) to  5 MG tablet; Take 1 tablet (5 mg total) by mouth every 12 (twelve) hours as needed for anxiety or muscle spasms ,ketorolac (TORADOL) injection 60 mg given, start Promethazine HCI 25 mg PRN 02/13/2021 Letta Median DO (PCP Office) sent in 14 tablets of 5 mg Valium 01/29/2021 Wilfred Lacy NP (PCP) decrease diazepam to 2 mg, take 1 tablet daily PRN, A1C was 6.1% 12/31/2020 Wilfred Lacy NP (PCP) Stop Bactroban ointment, Start oral bactrim 400-80 mg 2 times daily, referral to dermatology 12/24/2020 Wilfred Lacy NP (PCP) AMB Referral to [facility] Coordination,start Mupirocin 2% for 7 days 12/05/2020 Letta Median DO (PCP Office) start Zofran 4 mg PRN ,start Sumatriptan 50 mg PRN, Ketorolac Tromethamine 60 mg  given  11/20/2020 Caroleen Hamman LPN (PCP Office) Mammogram Ordered, No medication changes noted  Recent consult visits: 12/14/2020 Margaretha Sheffield MD (Physical Medicine and Rehabilitation) Unable to see note 12/11/2020 Margaretha Sheffield MD (Physical Medicine and Rehabilitation) Unable to see note 11/13/2020 Margaretha Sheffield MD (Physical Medicine and Rehabilitation) Unable to see note 11/05/2020 Dr.Yan MD (Neurology) Procedure completed, NCV with EMG, Start Cymbalta 30 mg daily, Refer to physical therapy  Hospital visits: None in previous 6 months   Objective:  Lab Results  Component Value Date   CREATININE 0.97 12/24/2020   BUN 26 (H) 12/24/2020   GFR 61.31 12/24/2020   GFRNONAA 62 09/22/2019   GFRAA 72 09/22/2019   NA 140 12/24/2020   K 3.9 12/24/2020   CALCIUM 9.8 12/24/2020   CO2 30 12/24/2020   GLUCOSE 105 (H) 12/24/2020    Lab Results  Component Value Date/Time   HGBA1C 6.1 (A) 01/29/2021  08:51 AM   HGBA1C 5.4 09/22/2019 12:17 PM   HGBA1C CANCELED 06/30/2016 10:42 AM   GFR 61.31 12/24/2020 09:53 AM   GFR 52.91 (L) 06/26/2020 08:53 AM    Last diabetic Eye exam: No results found for: HMDIABEYEEXA   Last diabetic Foot exam: No results found for: HMDIABFOOTEX   Lab Results  Component Value Date   CHOL 160 12/24/2020   HDL 47.00 12/24/2020   LDLCALC 83 12/24/2020   TRIG 149.0 12/24/2020   CHOLHDL 3 12/24/2020    Hepatic Function Latest Ref Rng & Units 12/24/2020 09/22/2019 06/01/2017  Total Protein 6.0 - 8.3 g/dL 6.9 7.0 6.6  Albumin 3.5 - 5.2 g/dL 4.3 - -  AST 0 - 37 U/L _0 ALT 0 - 35 U/L _1 Alk Phosphatase 39 - 117 U/L 52 - -  Total Bilirubin 0.2 - 1.2 mg/dL 0.5 0.3 0.5  Bilirubin, Direct 0.0 - 0.3 mg/dL 0.1 - -    Lab Results  Component Value Date/Time   TSH 0.94 06/01/2017 10:02 AM   TSH 0.79 10/20/2016 12:17 PM    CBC Latest Ref Rng & Units 09/22/2019 06/01/2017 10/20/2016  WBC 3.8 - 10.8 Thousand/uL 5.2 5.1 6.4  Hemoglobin 11.7 - 15.5 g/dL 13.0 12.3 13.6  Hematocrit 35.0 - 45.0 % 39.1 37.5 42.5  Platelets 140 - 400 Thousand/uL 166 168 177    No results found for: VD25OH  Clinical ASCVD: No  The 10-year ASCVD risk score (Arnett DK, et al., 2019) is: 3.3%   Values used to calculate the score:     Age: 27 years     Sex: Female     Is Non-Hispanic African American: No     Diabetic: No     Tobacco smoker: No     Systolic Blood Pressure: 291 mmHg     Is BP treated: No     HDL Cholesterol: 47 mg/dL     Total Cholesterol: 160 mg/dL    Depression screen [facility] 2/9 12/24/2020 11/20/2020 06/26/2020  Decreased Interest 0 0 2  Down, Depressed, Hopeless 0 0 1  PHQ - 2 Score 0 0 3  Altered sleeping 0 - 1  Tired, decreased energy 0 - 2  Change in appetite 0 - 0  Feeling bad or failure about yourself  0 - 0  Trouble concentrating 0 - 1  Moving slowly or fidgety/restless 3 - 0  Suicidal thoughts 0 - 0  PHQ-9 Score 3 - 7  Difficult doing work/chores Somewhat difficult - Not difficult at all  Some recent data might be hidden    Social History   Tobacco Use  Smoking Status Former   Types: Cigarettes  Smokeless Tobacco Never  Tobacco Comments   Quit in  1988   BP Readings from Last 3 Encounters:  02/22/21 100/70  01/29/21 112/78  12/24/20 118/60   Pulse Readings from Last 3 Encounters:  02/22/21 73  01/29/21 79  12/24/20 74   Wt Readings from Last 3 Encounters:  02/22/21 170 lb 12.8 oz (77.5 kg)  01/29/21 167 lb (75.8 kg)  12/24/20 167 lb (75.8 kg)   BMI Readings from Last 3 Encounters:  02/22/21 27.57 kg/m  01/29/21 26.95 kg/m  12/24/20 26.95 kg/m    Assessment/Interventions: Review of patient past medical history, allergies, medications, health status, including review of consultants reports, laboratory and other test data, was performed as part of comprehensive evaluation and provision of chronic care management services.   SDOH:  (Social  Determinants of Health) assessments and interventions performed: Yes SDOH Interventions    Flowsheet Row Most Recent Value  SDOH Interventions   Financial Strain Interventions Intervention Not Indicated      SDOH Screenings   Alcohol Screen: Low Risk    Last Alcohol Screening Score (AUDIT): 0  Depression (PHQ2-9): Low Risk    PHQ-2 Score: 3  Financial Resource Strain: Low Risk    Difficulty of Paying Living Expenses: Not hard at all  Food Insecurity: No Food Insecurity   Worried About Charity fundraiser in the Last Year: Never true   Ran Out of Food in the Last Year: Never true  Housing: Low Risk    Last Housing Risk Score: 0  Physical Activity: Sufficiently Active   Days of Exercise per Week: 7 days   Minutes of Exercise per Session: 30 min  Social Connections: Moderately Isolated   Frequency of Communication with Friends and Family: More than three times a week   Frequency of Social Gatherings with Friends and Family: More than three times a week   Attends Religious Services: More than 4 times per year   Active Member of [employer] or Organizations: No   Attends Archivist Meetings: Never   Marital Status: Widowed  Stress: No Stress Concern Present   Feeling  of Stress : Not at all  Tobacco Use: Medium Risk   Smoking Tobacco Use: Former   Smokeless Tobacco Use: Never  Transportation Needs: No Data processing manager (Medical): No   Lack of Transportation (Non-Medical): No    CCM Care Plan  Allergies  Allergen Reactions   Mobic [Meloxicam] Anaphylaxis   Penicillins Anaphylaxis   Nubain [Nalbuphine Hcl] Diarrhea and Nausea And Vomiting    Medications Reviewed Today     Reviewed by Zenia Resides, LPN (Licensed Practical Nurse) on 05/07/21 at 1011  Med List Status: <None>   Medication Order Taking? Sig Documenting Provider Last Dose Status Informant  atorvastatin (LIPITOR) 40 MG tablet [id number] Yes Take 1 tablet (40 mg total) by mouth daily. Nche, Charlene Brooke, NP Taking Active   buPROPion (WELLBUTRIN XL) 300 MG 24 hr tablet [id number] Yes Take 1 tablet by mouth once daily Susy Frizzle, MD Taking Active   CALCIUM CITRATE PO [id number] Yes Take by mouth 2 (two) times daily. [provider] Taking Active   Cholecalciferol (VITAMIN D3 PO) [id number] Yes Take by mouth daily. [provider] Taking Active   diazepam (VALIUM) 5 MG tablet [id number] Yes Take 1 tablet (5 mg total) by mouth every 12 (twelve) hours as needed for anxiety or muscle spasms (esophageal spasm). Ronnald Nian, DO Taking Active   DULoxetine (CYMBALTA) 60 MG capsule [id number] Yes Take 60 mg by mouth daily. [provider] Taking Active   mupirocin ointment (BACTROBAN) 2 % [id number] Yes Apply 1 application topically 2 (two) times daily. x7day Flossie Buffy, NP Taking Active   naloxone [facility]) nasal spray 4 mg/0.1 mL [id number] Yes SMARTSIG:Spray(s) In Nostril [provider] Taking Active   oxyCODONE-acetaminophen (PERCOCET) 10-325 MG tablet [id number] Yes Take 1 tablet by mouth every 6 (six) hours as needed. [provider] Taking Active   promethazine (PHENERGAN) 25 MG tablet [id number] Yes Take 1  tablet (25 mg total) by mouth every 8 (eight) hours as needed for nausea or vomiting. Ronnald Nian, DO Taking Active   SUMAtriptan (IMITREX) 50 MG tablet [id number] Yes Take 1 tablet (50 mg total) by mouth once as  needed for up to 1 dose for migraine (may repeat x 1 tab in 1-2hrs if headache persists). May repeat in 2 hours if headache persists or recurs. Nche, Charlene Brooke, NP Taking Active   tiZANidine (ZANAFLEX) 4 MG tablet [id number] Yes TAKE 1 TABLET BY MOUTH 4 TIMES A DAY AS NEEDED [provider] Taking Active   traZODone (DESYREL) 100 MG tablet [id number] Yes TAKE 2 TABLETS BY MOUTH IN THE EVENING AT BEDTIME Nche, Charlene Brooke, NP Taking Active             Patient Active Problem List   Diagnosis Date Noted   Hyperglycemia 01/29/2021   Esophageal spasm 01/29/2021   Bilateral low back pain with sciatica 11/05/2020   Gait abnormality 10/04/2020   Paresthesia 10/04/2020   Leg length inequality 02/16/2020   Scoliosis deformity of spine 02/16/2020   Finger pain, left 06/08/2017   Asymptomatic stenosis of right carotid artery    Fracture of ankle, bimalleolar, left, closed 08/10/2014   Shaking 01/12/2013   Anxiety    Memory loss    Lumbago    Fibromyalgia    Migraines    Hyperlipidemia    Depression    Neuralgic migraines    PULMONARY NODULE 07/25/2010   H/O migraine 01/05/2007    Immunization History  Administered Date(s) Administered   Fluad Quad(high Dose 65+) 06/26/2020   Influenza, High Dose Seasonal PF 05/31/2018   Influenza,inj,Quad PF,6+ Mos 07/18/2014, 07/23/2015, 05/23/2016, 05/15/2017   Influenza-Unspecified 05/12/2019   PFIZER(Purple Top)SARS-COV-2 Vaccination 11/10/2019, 12/05/2019, 06/11/2020   Tdap 07/18/2014, 05/16/2016    Conditions to be addressed/monitored:  Hyperlipidemia, Depression, and Anxiety  Care Plan : General Pharmacy (Adult)  Updates made by Germaine Pomfret, RPH since 05/10/2021 12:00 AM     Problem: Hyperlipidemia,  Depression, and Anxiety   Priority: High     Long-Range Goal: Patient-Specific Goal   Start Date: 05/10/2021  Expected End Date: 05/10/2022  This Visit's Progress: On track  Priority: High  Note:   Current Barriers:  Unable to maintain control of depression  Pharmacist Clinical Goal(s):  Patient will maintain control of depression, anxiety as evidenced by patient report  through collaboration with PharmD and provider.   Interventions: 1:1 collaboration with Nche, Charlene Brooke, NP regarding development and update of comprehensive plan of care as evidenced by provider attestation and co-signature Inter-disciplinary care team collaboration (see longitudinal plan of care) Comprehensive medication review performed; medication list updated in electronic medical record  Hyperlipidemia: (LDL goal < 100) -Controlled -Current treatment: Atorvastatin 40 mg daily   -Medications previously tried: NA  -Current dietary patterns: Limits food in eveining, limits red meat, fatty foods. Soft foods. Boost, water, rarelr Root Beer.  -Current exercise habits: Tries to walk when possible, limited by pain   -Educated on Importance of limiting foods high in cholesterol; -Recommended to continue current medication  Depression/Anxiety (Goal: Maintain disease remission) -Controlled -Current treatment: Bupropion XL 300 mg daily  Diazepam 5 mg twice daily as needed  Duloxetine 30 mg daily  Trazodone 100 mg 2 tablets nightly -Medications previously tried/failed: NA -Patient has been out of tramadol for a few nights -PHQ9: 3 -GAD7: 2 -Counseled on risks of diazepam use.  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 She was interested in counseling. Provided information for [place].  -Recommended to continue current medication  Osteopenia (Goal Prevent bone fractures) -Controlled -Last DEXA Scan: 12/09/19   T-Score femoral neck: -1.8  T-Score total hip:  -1.7  T-Score lumbar  spine: -2.4  T-Score forearm radius: NA  10-year probability of major osteoporotic fracture: 15.3%  10-year probability of hip fracture: 2.1% -Patient is not a candidate for pharmacologic treatment -Current treatment  Calcium Citrate twice daily  Vitamin D3 daily  -Medications previously tried: Alendronate (2017-2022)  -Recommend 1200 mg of calcium daily from dietary and supplemental sources. -Recommended to continue current medication  Chronic Pain (Goal: Achieve adequate pain control) -Not ideally controlled -Current treatment  Tizanidine 4 mg four times daily as needed  Oxycodone-APAP 10-325 mg q6hr PRN  -Medications previously tried: NA  -Recommended to continue current medication  Patient Goals/Self-Care Activities Patient will:  - take medications as prescribed  Follow Up Plan: Telephone follow up appointment with care management team member scheduled for:  10/14/20 at 11:00 AM      Medication Assistance: None required.  Patient affirms current coverage meets needs.  Compliance/Adherence/Medication fill history: Care Gaps: Shingrix Vaccine PNA Vaccine COVID-19 Vaccine Influenza Vaccine  Star-Rating Drugs: Atorvastatin 40 mg last filled 03/12/2021 for 90 day supply at [facility].  Patient's preferred pharmacy is:  [facility] [address] ([address] [place] [facility] [place] [address] [place]  [address] Phone: [phone] Fax: [phone]  Uses pill box? Yes Pt endorses 100% compliance  We discussed: Benefits of medication synchronization, packaging and delivery as well as enhanced pharmacist oversight with [employer]. Patient decided to: Continue current medication management strategy  Care Plan and Follow Up Patient Decision:  Patient agrees to Care Plan and Follow-up.  Plan: Telephone follow up appointment with care management team member scheduled for:  10/14/20 at 11:00 AM  Junius Argyle, PharmD, Para [place],  CPP Clinical Pharmacist [facility]  [phone]

## 2021-04-22 NOTE — Telephone Encounter (Signed)
Medication sent in on 04/19/21

## 2021-04-22 NOTE — Patient Instructions (Addendum)
Visit Information It was great speaking with you today!  Please let me know if you have any questions about our visit.   Goals Addressed             This Visit's Progress    Track and Manage My Symptoms-Depression       Timeframe:  Long-Range Goal Priority:  High Start Date:  05/10/21                            Expected End Date: 05/10/22                      Follow Up within 90 days   - avoid negative self-talk - exercise at least 2 to 3 times per week - have a plan for how to handle bad days - spend time or talk with others at least 2 to 3 times per week - watch for early signs of feeling worse    Why is this important?   Keeping track of your progress will help your treatment team find the right mix of medicine and therapy for you.  Write in your journal every day.  Day-to-day changes in depression symptoms are normal. It may be more helpful to check your progress at the end of each week instead of every day.     Notes:         Patient Care Plan: General Pharmacy (Adult)     Problem Identified: Hyperlipidemia, Depression, and Anxiety   Priority: High     Long-Range Goal: Patient-Specific Goal   Start Date: 05/10/2021  Expected End Date: 05/10/2022  This Visit's Progress: On track  Priority: High  Note:   Current Barriers:  Unable to maintain control of depression  Pharmacist Clinical Goal(s):  Patient will maintain control of depression, anxiety as evidenced by patient report  through collaboration with PharmD and provider.   Interventions: 1:1 collaboration with Nche, Charlene Brooke, NP regarding development and update of comprehensive plan of care as evidenced by provider attestation and co-signature Inter-disciplinary care team collaboration (see longitudinal plan of care) Comprehensive medication review performed; medication list updated in electronic medical record  Hyperlipidemia: (LDL goal < 100) -Controlled -Current treatment: Atorvastatin 40 mg  daily   -Medications previously tried: NA  -Current dietary patterns: Limits food in eveining, limits red meat, fatty foods. Soft foods. Boost, water, rarelr Root Beer.  -Current exercise habits: Tries to walk when possible, limited by pain   -Educated on Importance of limiting foods high in cholesterol; -Recommended to continue current medication  Depression/Anxiety (Goal: Maintain disease remission) -Controlled -Current treatment: Bupropion XL 300 mg daily  Diazepam 5 mg twice daily as needed  Duloxetine 30 mg daily  Trazodone 100 mg 2 tablets nightly -Medications previously tried/failed: NA -Patient has been out of tramadol for a few nights -PHQ9: 3 -GAD7: 2 -Counseled on risks of diazepam use.  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 She was interested in counseling. Provided information for [place].  -Recommended to continue current medication  Osteopenia (Goal Prevent bone fractures) -Controlled -Last DEXA Scan: 12/09/19   T-Score femoral neck: -1.8  T-Score total hip: -1.7  T-Score lumbar spine: -2.4  T-Score forearm radius: NA  10-year probability of major osteoporotic fracture: 15.3%  10-year probability of hip fracture: 2.1% -Patient is not a candidate for pharmacologic treatment -Current treatment  Calcium Citrate twice daily  Vitamin D3 daily  -Medications previously  tried: Alendronate (2017-2022)  -Recommend 1200 mg of calcium daily from dietary and supplemental sources. -Recommended to continue current medication  Chronic Pain (Goal: Achieve adequate pain control) -Not ideally controlled -Current treatment  Tizanidine 4 mg four times daily as needed  Oxycodone-APAP 10-325 mg q6hr PRN  -Medications previously tried: NA  -Recommended to continue current medication  Patient Goals/Self-Care Activities Patient will:  - take medications as prescribed  Follow Up Plan: Telephone follow up appointment with care  management team member scheduled for:  10/14/20 at 11:00 AM     Ms. Mitton was given information about Chronic Care Management services today including:  CCM service includes personalized support from designated clinical staff supervised by her physician, including individualized plan of care and coordination with other care providers 24/7 contact phone numbers for assistance for urgent and routine care needs. Standard insurance, coinsurance, copays and deductibles apply for chronic care management only during months in which we provide at least 20 minutes of these services. Most insurances cover these services at 100%, however patients may be responsible for any copay, coinsurance and/or deductible if applicable. This service may help you avoid the need for more expensive face-to-face services. Only one practitioner may furnish and bill the service in a calendar month. The patient may stop CCM services at any time (effective at the end of the month) by phone call to the office staff.  Patient agreed to services and verbal consent obtained.   The patient verbalized understanding of instructions, educational materials, and care plan provided today and declined offer to receive copy of patient instructions, educational materials, and care plan.   Junius Argyle, PharmD, Para [place], CPP Clinical Pharmacist [facility]  [phone]

## 2021-04-24 ENCOUNTER — Ambulatory Visit: Payer: Medicare Other | Admitting: Nurse Practitioner

## 2021-04-25 DIAGNOSIS — M1711 Unilateral primary osteoarthritis, right knee: Secondary | ICD-10-CM | POA: Diagnosis not present

## 2021-04-25 DIAGNOSIS — S93401D Sprain of unspecified ligament of right ankle, subsequent encounter: Secondary | ICD-10-CM | POA: Diagnosis not present

## 2021-05-02 DIAGNOSIS — M47812 Spondylosis without myelopathy or radiculopathy, cervical region: Secondary | ICD-10-CM | POA: Diagnosis not present

## 2021-05-02 DIAGNOSIS — M47817 Spondylosis without myelopathy or radiculopathy, lumbosacral region: Secondary | ICD-10-CM | POA: Diagnosis not present

## 2021-05-02 DIAGNOSIS — M62830 Muscle spasm of back: Secondary | ICD-10-CM | POA: Diagnosis not present

## 2021-05-02 DIAGNOSIS — G894 Chronic pain syndrome: Secondary | ICD-10-CM | POA: Diagnosis not present

## 2021-05-07 ENCOUNTER — Other Ambulatory Visit: Payer: Self-pay

## 2021-05-07 ENCOUNTER — Encounter: Payer: Self-pay | Admitting: Dermatology

## 2021-05-07 ENCOUNTER — Ambulatory Visit (INDEPENDENT_AMBULATORY_CARE_PROVIDER_SITE_OTHER): Payer: Medicare Other | Admitting: Dermatology

## 2021-05-07 DIAGNOSIS — L281 Prurigo nodularis: Secondary | ICD-10-CM | POA: Diagnosis not present

## 2021-05-07 MED ORDER — MINOCYCLINE HCL 50 MG PO CAPS: 50.0000 mg | ORAL_CAPSULE | Freq: Two times a day (BID) | ORAL | 1 refills | Status: DC

## 2021-05-07 NOTE — Patient Instructions (Signed)
Get over the counter pramoxine to use

## 2021-05-10 DIAGNOSIS — E7800 Pure hypercholesterolemia, unspecified: Secondary | ICD-10-CM

## 2021-05-10 DIAGNOSIS — F3341 Major depressive disorder, recurrent, in partial remission: Secondary | ICD-10-CM | POA: Diagnosis not present

## 2021-05-24 ENCOUNTER — Encounter: Payer: Self-pay | Admitting: Dermatology

## 2021-05-24 NOTE — Progress Notes (Signed)
   Follow-Up Visit   Subjective  Julie Neal is a 65 y.o. female who presents for the following: Rash (On forehead & chin x several months- tx ointment & oral antibiotic but cant remember name).  Nonhealing spots on face for months.  Uncertain what medications have already been tried. Location:  Duration:  Quality:  Associated Signs/Symptoms: Modifying Factors:  Severity:  Timing: Context:   Objective  Well appearing patient in no apparent distress; mood and affect are within normal limits. Mid Forehead, Mid Lower Cutaneous Lip, Right Ankle - Anterior Excoriated digs with some secondary impetiginization.  Difficult to determine whether there is underlying adult acne or sebaceous hyperplasia.    A focused examination was performed including head and neck. Relevant physical exam findings are noted in the Assessment and Plan.   Assessment & Plan    Prurigo nodularis (3) Right Ankle - Anterior; Mid Forehead; Mid Lower Cutaneous Lip  Discussed in some detail the frustration of the repetitive cycle of irritation and picking up.  If there is no response to low-dose antibiotics will consider intralesional injections or biopsy.  minocycline (MINOCIN) 50 MG capsule - Mid Forehead, Mid Lower Cutaneous Lip, Right Ankle - Anterior Take 1 capsule (50 mg total) by mouth 2 (two) times daily.      I, Lavonna Monarch, MD, have reviewed all documentation for this visit.  The documentation on 05/24/21 for the exam, diagnosis, procedures, and orders are all accurate and complete.

## 2021-05-31 DIAGNOSIS — M47812 Spondylosis without myelopathy or radiculopathy, cervical region: Secondary | ICD-10-CM | POA: Diagnosis not present

## 2021-05-31 DIAGNOSIS — G894 Chronic pain syndrome: Secondary | ICD-10-CM | POA: Diagnosis not present

## 2021-05-31 DIAGNOSIS — M47817 Spondylosis without myelopathy or radiculopathy, lumbosacral region: Secondary | ICD-10-CM | POA: Diagnosis not present

## 2021-05-31 DIAGNOSIS — Z79891 Long term (current) use of opiate analgesic: Secondary | ICD-10-CM | POA: Diagnosis not present

## 2021-05-31 DIAGNOSIS — M62830 Muscle spasm of back: Secondary | ICD-10-CM | POA: Diagnosis not present

## 2021-06-22 NOTE — Addendum Note (Signed)
Addended by: Lavonna Monarch on: 06/22/2021 09:08 AM   Modules accepted: Level of Service

## 2021-06-26 ENCOUNTER — Encounter: Payer: Self-pay | Admitting: Nurse Practitioner

## 2021-06-26 ENCOUNTER — Other Ambulatory Visit: Payer: Self-pay

## 2021-06-26 ENCOUNTER — Ambulatory Visit (INDEPENDENT_AMBULATORY_CARE_PROVIDER_SITE_OTHER): Payer: Medicare Other | Admitting: Nurse Practitioner

## 2021-06-26 VITALS — BP 118/64 | HR 88 | Temp 96.9°F | Wt 174.0 lb

## 2021-06-26 DIAGNOSIS — K224 Dyskinesia of esophagus: Secondary | ICD-10-CM

## 2021-06-26 DIAGNOSIS — R739 Hyperglycemia, unspecified: Secondary | ICD-10-CM

## 2021-06-26 DIAGNOSIS — Z23 Encounter for immunization: Secondary | ICD-10-CM

## 2021-06-26 DIAGNOSIS — Z9181 History of falling: Secondary | ICD-10-CM | POA: Insufficient documentation

## 2021-06-26 DIAGNOSIS — E785 Hyperlipidemia, unspecified: Secondary | ICD-10-CM | POA: Diagnosis not present

## 2021-06-26 DIAGNOSIS — E7800 Pure hypercholesterolemia, unspecified: Secondary | ICD-10-CM | POA: Diagnosis not present

## 2021-06-26 DIAGNOSIS — F3341 Major depressive disorder, recurrent, in partial remission: Secondary | ICD-10-CM

## 2021-06-26 LAB — COMPREHENSIVE METABOLIC PANEL
ALT: 32 U/L (ref 0–35)
AST: 32 U/L (ref 0–37)
Albumin: 4.6 g/dL (ref 3.5–5.2)
Alkaline Phosphatase: 59 U/L (ref 39–117)
BUN: 22 mg/dL (ref 6–23)
CO2: 32 mEq/L (ref 19–32)
Calcium: 10 mg/dL (ref 8.4–10.5)
Chloride: 99 mEq/L (ref 96–112)
Creatinine, Ser: 1.13 mg/dL (ref 0.40–1.20)
GFR: 50.87 mL/min — ABNORMAL LOW (ref >60.00)
Glucose, Bld: 95 mg/dL (ref 70–99)
Potassium: 4.1 mEq/L (ref 3.5–5.1)
Sodium: 140 mEq/L (ref 135–145)
Total Bilirubin: 0.5 mg/dL (ref 0.2–1.2)
Total Protein: 7.5 g/dL (ref 6.0–8.3)

## 2021-06-26 LAB — HEMOGLOBIN A1C: Hgb A1c MFr Bld: 6.4 % (ref 4.6–6.5)

## 2021-06-26 LAB — TSH: TSH: 1.04 u[IU]/mL (ref 0.35–5.50)

## 2021-06-26 MED ORDER — DIAZEPAM 5 MG PO TABS: 5.0000 mg | ORAL_TABLET | Freq: Two times a day (BID) | ORAL | 2 refills | Status: DC | PRN

## 2021-06-26 MED ORDER — ATORVASTATIN CALCIUM 40 MG PO TABS: 40.0000 mg | ORAL_TABLET | Freq: Every day | ORAL | 3 refills | Status: DC

## 2021-06-26 MED ORDER — TRAZODONE HCL 100 MG PO TABS: 200.0000 mg | ORAL_TABLET | Freq: Every day | ORAL | 1 refills | Status: DC

## 2021-06-26 MED ORDER — BUPROPION HCL ER (XL) 300 MG PO TB24: 300.0000 mg | ORAL_TABLET | Freq: Every day | ORAL | 3 refills | Status: DC

## 2021-06-26 MED ORDER — DULOXETINE HCL 60 MG PO CPEP: 60.0000 mg | ORAL_CAPSULE | Freq: Every day | ORAL | 3 refills | Status: DC

## 2021-06-26 NOTE — Assessment & Plan Note (Signed)
Reports 4 falls in last 51months, no injury. Due to unsteady gait. Falls occurred when not using walking in her bedroom. Denies need for home PT.  Advised about ways to make her room safe and fall precautions Advised to try chair exercises and to call office if she decides to start home PT.

## 2021-06-26 NOTE — Patient Instructions (Signed)
Go to lab for blood draw. Maintain current medication doses Try chair exercises for muscle strengthening.  Exercises to do While Sitting Exercises that you do while sitting (chair exercises) can give you many of the same benefits as full exercise. Benefits include strengthening your heart, burning calories, and keeping muscles and joints healthy. Exercise can also improve your mood and help with depression and anxiety. You may benefit from chair exercises if you are unable to do standing exercises due to: Diabetic foot pain. Obesity. Illness. Arthritis. Recovery from surgery or injury. Breathing problems. Balance problems. Another type of disability. Before starting chair exercises, check with your health care provider or a physical therapist to find out how much exercise you can tolerate and which exercises are safe for you. If your health care provider approves: Start out slowly and build up over time. Aim to work up to about 10-20 minutes for each exercise session. Make exercise part of your daily routine. Drink water when you exercise. Do not wait until you are thirsty. Drink every 10-15 minutes. Stop exercising right away if you have pain, nausea, shortness of breath, or dizziness. If you are exercising in a wheelchair, make sure to lock the wheels. Ask your health care provider whether you can do tai chi or yoga. Many positions in these mind-body exercises can be modified to do while seated. Warm-up Before starting other exercises: Sit up as straight as you can. Have your knees bent at 90 degrees, which is the shape of the capital letter "L." Keep your feet flat on the floor. Sit at the front edge of your chair, if you can. Pull in (tighten) the muscles in your abdomen and stretch your spine and neck as straight as you can. Hold this position for a few minutes. Breathe in and out evenly. Try to concentrate on your breathing, and relax your mind. Stretching Exercise A: Arm  stretch Hold your arms out straight in front of your body. Bend your hands at the wrist with your fingers pointing up, as if signaling someone to stop. Notice the slight tension in your forearms as you hold the position. Keeping your arms out and your hands bent, rotate your hands outward as far as you can and hold this stretch. Aim to have your thumbs pointing up and your pinkie fingers pointing down. Slowly repeat arm stretches for one minute as tolerated. Exercise B: Leg stretch If you can move your legs, try to "draw" letters on the floor with the toes of your foot. Write your name with one foot. Write your name with the toes of your other foot. Slowly repeat the movements for one minute as tolerated. Exercise C: Reach for the sky Reach your hands as far over your head as you can to stretch your spine. Move your hands and arms as if you are climbing a rope. Slowly repeat the movements for one minute as tolerated. Range of motion exercises Exercise A: Shoulder roll Let your arms hang loosely at your sides. Lift just your shoulders up toward your ears, then let them relax back down. When your shoulders feel loose, rotate your shoulders in backward and forward circles. Do shoulder rolls slowly for one minute as tolerated. Exercise B: March in place As if you are marching, pump your arms and lift your legs up and down. Lift your knees as high as you can. If you are unable to lift your knees, just pump your arms and move your ankles and feet up and down. March  in place for one minute as tolerated. Exercise C: Seated jumping jacks Let your arms hang down straight. Keeping your arms straight, lift them up over your head. Aim to point your fingers to the ceiling. While you lift your arms, straighten your legs and slide your heels along the floor to your sides, as wide as you can. As you bring your arms back down to your sides, slide your legs back together. If you are unable to use your legs,  just move your arms. Slowly repeat seated jumping jacks for one minute as tolerated. Strengthening exercises Exercise A: Shoulder squeeze Hold your arms straight out from your body to your sides, with your elbows bent and your fists pointed at the ceiling. Keeping your arms in the bent position, move them forward so your elbows and forearms meet in front of your face. Open your arms back out as wide as you can with your elbows still bent, until you feel your shoulder blades squeezing together. Hold for 5 seconds. Slowly repeat the movements forward and backward for one minute as tolerated. Contact a health care provider if: You have to stop exercising due to any of the following: Pain. Nausea. Shortness of breath. Dizziness. Fatigue. You have significant pain or soreness after exercising. Get help right away if: You have chest pain. You have difficulty breathing. These symptoms may represent a serious problem that is an emergency. Do not wait to see if the symptoms will go away. Get medical help right away. Call your local emergency services (911 in the [place] Do not drive yourself to the hospital. Summary Exercises that you do while sitting (chair exercises) can strengthen your heart, burn calories, and keep muscles and joints healthy. You may benefit from chair exercises if you are unable to do standing exercises due to diabetic foot pain, obesity, recovery from surgery or injury, or other conditions. Before starting chair exercises, check with your health care provider or a physical therapist to find out how much exercise you can tolerate and which exercises are safe for you. This information is not intended to replace advice given to you by your health care provider. Make sure you discuss any questions you have with your health care provider. Document Revised: 09/23/2020 Document Reviewed: 09/23/2020 Elsevier Patient Education  2022 [employer].

## 2021-06-26 NOTE — Assessment & Plan Note (Signed)
Valium used 1-2x/day as needed PMP database reviewed: valium filled 03/08/2021, 04/21/2021, and 05/31/2021 #60tabs each. She also filled oxycodone/acetaminophen prescribed by [facility]: Dr. Corinna Capra. Management of chronic back pain She was also provided narcan prescription. Advised about the risk of over sedation with current medications. She verbalized understanding

## 2021-06-26 NOTE — Progress Notes (Signed)
Subjective:  Patient ID: Julie Neal, female    DOB: [birth year]  Age: 65 y.o. MRN: [id number]  CC: Follow-up (6 month f/u on HTN and depression. /)  HPI Home with sister and Brother in [place].  Depression, recurrent ([place]) Stable mood with cymbalta, wellbutrin and trazodone. Denies any daytime somnolence. Denies need for CBT.  Med refill sent F/up in 61months  At high risk for falls Reports 4 falls in last 26months, no injury. Due to unsteady gait. Falls occurred when not using walking in her bedroom. Denies need for home PT.  Advised about ways to make her room safe and fall precautions Advised to try chair exercises and to call office if she decides to start home PT.  Hyperlipidemia Repeat lipid panel Continue atorvastatin  Hyperglycemia Repeat hgbA1c  Esophageal spasm Valium used 1-2x/day as needed PMP database reviewed: valium filled 03/08/2021, 04/21/2021, and 05/31/2021 #60tabs each. She also filled oxycodone/acetaminophen prescribed by [facility]: Dr. Corinna Neal. Management of chronic back pain She was also provided narcan prescription. Advised about the risk of over sedation with current medications. She verbalized understanding  Depression screen [facility] 2/9 06/26/2021 12/24/2020 11/20/2020  Decreased Interest 0 0 0  Down, Depressed, Hopeless 1 0 0  PHQ - 2 Score 1 0 0  Altered sleeping 0 0 -  Tired, decreased energy 0 0 -  Change in appetite 2 0 -  Feeling bad or failure about yourself  0 0 -  Trouble concentrating 1 0 -  Moving slowly or fidgety/restless 0 3 -  Suicidal thoughts 0 0 -  PHQ-9 Score 4 3 -  Difficult doing work/chores Somewhat difficult Somewhat difficult -  Some recent data might be hidden    GAD 7 : Generalized Anxiety Score 06/26/2021 12/24/2020 06/26/2020  Nervous, Anxious, on Edge 0 1 0  Control/stop worrying 2 0 0  Worry too much - different things 1 0 0  Trouble relaxing 1 0 1  Restless 0 1 0  Easily annoyed or irritable 2  0 1  Afraid - awful might happen 0 0 0  Total GAD 7 Score 6 2 2   Anxiety Difficulty Somewhat difficult Somewhat difficult Not difficult at all   Reviewed past Medical, Social and Family history today.  Outpatient Medications Prior to Visit  Medication Sig Dispense Refill   CALCIUM CITRATE PO Take by mouth 2 (two) times daily.     Cholecalciferol (VITAMIN D3 PO) Take by mouth daily.     minocycline (MINOCIN) 50 MG capsule Take 1 capsule (50 mg total) by mouth 2 (two) times daily. 60 capsule 1   mupirocin ointment (BACTROBAN) 2 % Apply 1 application topically 2 (two) times daily. x7day 15 g 0   naloxone (NARCAN) nasal spray 4 mg/0.1 mL SMARTSIG:Spray(s) In Nostril     oxyCODONE-acetaminophen (PERCOCET) 10-325 MG tablet Take 1 tablet by mouth every 6 (six) hours as needed.     promethazine (PHENERGAN) 25 MG tablet Take 1 tablet (25 mg total) by mouth every 8 (eight) hours as needed for nausea or vomiting. 30 tablet 1   SUMAtriptan (IMITREX) 50 MG tablet Take 1 tablet (50 mg total) by mouth once as needed for up to 1 dose for migraine (may repeat x 1 tab in 1-2hrs if headache persists). May repeat in 2 hours if headache persists or recurs. 10 tablet 2   tiZANidine (ZANAFLEX) 4 MG tablet TAKE 1 TABLET BY MOUTH 4 TIMES A DAY AS NEEDED  0   atorvastatin (LIPITOR)  40 MG tablet Take 1 tablet (40 mg total) by mouth daily. 90 tablet 3   buPROPion (WELLBUTRIN XL) 300 MG 24 hr tablet Take 1 tablet by mouth once daily 90 tablet 0   diazepam (VALIUM) 5 MG tablet Take 1 tablet (5 mg total) by mouth every 12 (twelve) hours as needed for anxiety or muscle spasms (esophageal spasm). 60 tablet 2   DULoxetine (CYMBALTA) 60 MG capsule Take 60 mg by mouth daily.     traZODone (DESYREL) 100 MG tablet TAKE 2 TABLETS BY MOUTH IN THE EVENING AT BEDTIME 180 tablet 0   No facility-administered medications prior to visit.    ROS See HPI  Objective:  BP 118/64 (BP Location: Left Arm, Patient Position: Sitting, Cuff  Size: Normal)   Pulse 88   Temp (!) 96.9 F (36.1 C) (Temporal)   Wt 174 lb (78.9 kg)   SpO2 98%   BMI 28.08 kg/m   Physical Exam Cardiovascular:     Rate and Rhythm: Normal rate and regular rhythm.     Pulses: Normal pulses.     Heart sounds: Normal heart sounds.  Pulmonary:     Effort: Pulmonary effort is normal.     Breath sounds: Normal breath sounds.  Skin:    General: Skin is warm and dry.  Neurological:     Mental Status: She is alert and oriented to person, place, and time.     Motor: Weakness present.     Gait: Gait abnormal.     Comments: Bilateral LE weakness. 1 person assist to get on exam table Ambulates with walker without assist  Psychiatric:        Mood and Affect: Mood normal.        Behavior: Behavior normal.        Thought Content: Thought content normal.    Assessment & Plan:  This visit occurred during the SARS-CoV-2 public health emergency.  Safety protocols were in place, including screening questions prior to the visit, additional usage of staff PPE, and extensive cleaning of exam room while observing appropriate contact time as indicated for disinfecting solutions.   Julie Neal was seen today for follow-up.  Diagnoses and all orders for this visit:  Recurrent major depressive disorder, in partial remission ([place]) -     TSH -     DULoxetine (CYMBALTA) 60 MG capsule; Take 1 capsule (60 mg total) by mouth daily. -     buPROPion (WELLBUTRIN XL) 300 MG 24 hr tablet; Take 1 tablet (300 mg total) by mouth daily. -     traZODone (DESYREL) 100 MG tablet; Take 2 tablets (200 mg total) by mouth at bedtime.  Esophageal spasm -     diazepam (VALIUM) 5 MG tablet; Take 1 tablet (5 mg total) by mouth every 12 (twelve) hours as needed for muscle spasms (esophageal spasm).  Hyperglycemia -     Hemoglobin A1c  Pure hypercholesterolemia -     Comprehensive metabolic panel  At high risk for falls  Hyperlipidemia, unspecified hyperlipidemia type -     atorvastatin  (LIPITOR) 40 MG tablet; Take 1 tablet (40 mg total) by mouth daily.  Need for pneumococcal vaccine -     Pneumococcal conjugate vaccine 13-valent IM  Flu vaccine need -     Flu Vaccine QUAD High Dose(Fluad)   Problem List Items Addressed This Visit       Digestive   Esophageal spasm    Valium used 1-2x/day as needed PMP database reviewed: valium filled 03/08/2021,  04/21/2021, and 05/31/2021 #60tabs each. She also filled oxycodone/acetaminophen prescribed by [facility]: Dr. Corinna Neal. Management of chronic back pain She was also provided narcan prescription. Advised about the risk of over sedation with current medications. She verbalized understanding      Relevant Medications   diazepam (VALIUM) 5 MG tablet (Start on 07/01/2021)     Other   At high risk for falls    Reports 4 falls in last 61months, no injury. Due to unsteady gait. Falls occurred when not using walking in her bedroom. Denies need for home PT.  Advised about ways to make her room safe and fall precautions Advised to try chair exercises and to call office if she decides to start home PT.      Depression, recurrent ([place]) - Primary    Stable mood with cymbalta, wellbutrin and trazodone. Denies any daytime somnolence. Denies need for CBT.  Med refill sent F/up in 73months      Relevant Medications   diazepam (VALIUM) 5 MG tablet (Start on 07/01/2021)   DULoxetine (CYMBALTA) 60 MG capsule   buPROPion (WELLBUTRIN XL) 300 MG 24 hr tablet   traZODone (DESYREL) 100 MG tablet   Hyperglycemia    Repeat hgbA1c      Relevant Orders   Hemoglobin A1c   Hyperlipidemia    Repeat lipid panel Continue atorvastatin      Relevant Medications   atorvastatin (LIPITOR) 40 MG tablet   Other Relevant Orders   Comprehensive metabolic panel   Other Visit Diagnoses     Need for pneumococcal vaccine       Relevant Orders   Pneumococcal conjugate vaccine 13-valent IM (Completed)   Flu vaccine need        Relevant Orders   Flu Vaccine QUAD High Dose(Fluad) (Completed)        Follow-up: Return in about 6 months (around 12/24/2021) for Depression and , hyperlipidemia (fasting).  Wilfred Lacy, NP

## 2021-06-26 NOTE — Assessment & Plan Note (Signed)
Stable mood with cymbalta, wellbutrin and trazodone. Denies any daytime somnolence. Denies need for CBT.  Med refill sent F/up in 34months

## 2021-06-26 NOTE — Assessment & Plan Note (Signed)
Repeat lipid panel Continue atorvastatin 

## 2021-06-26 NOTE — Assessment & Plan Note (Signed)
Repeat hgbA1c 

## 2021-06-27 NOTE — Progress Notes (Signed)
Stable results with HgbA1c at 6.4%. you are at risk of becoming diabetic. Low carb low sugar, high protein and high fiber diet is important. Do you want referral to nutritionist to help with dietary choices?

## 2021-07-01 DIAGNOSIS — G894 Chronic pain syndrome: Secondary | ICD-10-CM | POA: Diagnosis not present

## 2021-07-01 DIAGNOSIS — M62830 Muscle spasm of back: Secondary | ICD-10-CM | POA: Diagnosis not present

## 2021-07-01 DIAGNOSIS — M47817 Spondylosis without myelopathy or radiculopathy, lumbosacral region: Secondary | ICD-10-CM | POA: Diagnosis not present

## 2021-07-01 DIAGNOSIS — M47812 Spondylosis without myelopathy or radiculopathy, cervical region: Secondary | ICD-10-CM | POA: Diagnosis not present

## 2021-07-02 ENCOUNTER — Telehealth: Payer: Self-pay

## 2021-07-02 NOTE — Progress Notes (Signed)
Chronic Care Management Pharmacy Assistant   Name: Julie Neal  MRN: [id number] DOB: [birth year]  Reason for Encounter: Medication Review/General Adherence Call.   Recent office visits:  06/26/2021 Wilfred Lacy NP (PCP) Change Diazepem 5 mg every 12 hours PRN, Change Trazodone 200 mg daily at bedtime,Return in about 6 months   Recent consult visits:  05/07/2021 Dr.Tafeen MD (Dermatology) Start Minocycline 50 mg two times daily,Return in about 2 months   Hospital visits:  None in previous 6 months  Medications: Outpatient Encounter Medications as of 07/02/2021  Medication Sig   atorvastatin (LIPITOR) 40 MG tablet Take 1 tablet (40 mg total) by mouth daily.   buPROPion (WELLBUTRIN XL) 300 MG 24 hr tablet Take 1 tablet (300 mg total) by mouth daily.   CALCIUM CITRATE PO Take by mouth 2 (two) times daily.   Cholecalciferol (VITAMIN D3 PO) Take by mouth daily.   diazepam (VALIUM) 5 MG tablet Take 1 tablet (5 mg total) by mouth every 12 (twelve) hours as needed for muscle spasms (esophageal spasm).   DULoxetine (CYMBALTA) 60 MG capsule Take 1 capsule (60 mg total) by mouth daily.   minocycline (MINOCIN) 50 MG capsule Take 1 capsule (50 mg total) by mouth 2 (two) times daily.   mupirocin ointment (BACTROBAN) 2 % Apply 1 application topically 2 (two) times daily. x7day   naloxone (NARCAN) nasal spray 4 mg/0.1 mL SMARTSIG:Spray(s) In Nostril   oxyCODONE-acetaminophen (PERCOCET) 10-325 MG tablet Take 1 tablet by mouth every 6 (six) hours as needed.   promethazine (PHENERGAN) 25 MG tablet Take 1 tablet (25 mg total) by mouth every 8 (eight) hours as needed for nausea or vomiting.   SUMAtriptan (IMITREX) 50 MG tablet Take 1 tablet (50 mg total) by mouth once as needed for up to 1 dose for migraine (may repeat x 1 tab in 1-2hrs if headache persists). May repeat in 2 hours if headache persists or recurs.   tiZANidine (ZANAFLEX) 4 MG tablet TAKE 1 TABLET BY MOUTH 4 TIMES A DAY AS NEEDED    traZODone (DESYREL) 100 MG tablet Take 2 tablets (200 mg total) by mouth at bedtime.   No facility-administered encounter medications on file as of 07/02/2021.    Care Gaps: DPOEU-23 Vaccine  Star Rating Drugs: Atorvastatin 40 mg last filled 05/30/2021 for 90 day supply at [facility].  Medication Fill Gaps: None ID  Called patient and discussed medication adherence  with patient, no issues at this time with current medication.   Patient Denies ED visit since her last CPP follow up.  Patient Denies  any side effects with her medication. Patient Denies  any problems with her current pharmacy  PHQ9 SCORE ONLY 06/26/2021 12/24/2020 11/20/2020  PHQ-9 Total Score 4 3 0   PHQ9 Screening  Not at all(0) Several days (1)More than half the days (2)Nearly every day(3) 1. Little interest or pleasure in doing things  3 2. Feeling down, depressed, or hopeless   0 3. Trouble falling or staying asleep, or sleeping too much  0 4. Feeling tired or having little energy   1 5. Poor appetite or overeating   0 6. Feeling bad about yourself or that you are a failure or  have let yourself or your family down   0 7. Trouble concentrating on things, such as reading the  newspaper or watching television  0 8. Moving or speaking so slowly that other people could  have noticed. Or the opposite being so fidgety or  restless  that you have been moving around a lot more  than usual  0 9. Thoughts that you would be better off dead, or of  hurting yourself   0  TOTAL: 4  10. If you checked off any problems, how difficult have these problems made it for you to do  your work, take care of things at home, or get  along with other people?   Not difficult at all   Telephone follow up appointment with Care management team member scheduled for : 10/14/2020 at 11:00 am.  [place] Pharmacist Assistant [phone]

## 2021-07-10 ENCOUNTER — Other Ambulatory Visit: Payer: Self-pay | Admitting: Dermatology

## 2021-07-10 DIAGNOSIS — L281 Prurigo nodularis: Secondary | ICD-10-CM

## 2021-07-11 ENCOUNTER — Ambulatory Visit: Payer: Medicare Other | Admitting: Dermatology

## 2021-07-29 DIAGNOSIS — M47812 Spondylosis without myelopathy or radiculopathy, cervical region: Secondary | ICD-10-CM | POA: Diagnosis not present

## 2021-07-29 DIAGNOSIS — G894 Chronic pain syndrome: Secondary | ICD-10-CM | POA: Diagnosis not present

## 2021-07-29 DIAGNOSIS — M62830 Muscle spasm of back: Secondary | ICD-10-CM | POA: Diagnosis not present

## 2021-07-29 DIAGNOSIS — M47817 Spondylosis without myelopathy or radiculopathy, lumbosacral region: Secondary | ICD-10-CM | POA: Diagnosis not present

## 2021-08-06 ENCOUNTER — Telehealth: Payer: Self-pay | Admitting: Nurse Practitioner

## 2021-08-06 NOTE — Telephone Encounter (Signed)
What is the name of the medication? diazepam (VALIUM) 5 MG tablet [[id number]]   Have you contacted your pharmacy to request a refill? Pt is needing a refill.  Which pharmacy would you like this sent to? [facility] ([address] [place] - [address]  [address] [place]) [place] [address]  Phone:  [phone]  Fax:  [phone]    Patient notified that their request is being sent to the clinical staff for review and that they should receive a call once it is complete. If they do not receive a call within 72 hours they can check with their pharmacy or our office.

## 2021-08-09 NOTE — Telephone Encounter (Signed)
Pt states she was not aware that she had refills and just needed to call her pharmacy. Pt will call and get medication refilled.

## 2021-08-15 ENCOUNTER — Telehealth: Payer: Self-pay

## 2021-08-15 NOTE — Progress Notes (Signed)
Per Clinical pharmacist , please move patient appointment on 10/17/2021 to either 11:45 pm or 3:00 pm.  Telephone follow up appointment with Care management team member rescheduled for : 10/17/2021 at 11:45 am.  Anderson [place] Clinical Pharmacist Assistant [phone]

## 2021-08-26 ENCOUNTER — Telehealth: Payer: Self-pay

## 2021-08-26 DIAGNOSIS — M62830 Muscle spasm of back: Secondary | ICD-10-CM | POA: Diagnosis not present

## 2021-08-26 DIAGNOSIS — G894 Chronic pain syndrome: Secondary | ICD-10-CM | POA: Diagnosis not present

## 2021-08-26 DIAGNOSIS — M47812 Spondylosis without myelopathy or radiculopathy, cervical region: Secondary | ICD-10-CM | POA: Diagnosis not present

## 2021-08-26 DIAGNOSIS — M47817 Spondylosis without myelopathy or radiculopathy, lumbosacral region: Secondary | ICD-10-CM | POA: Diagnosis not present

## 2021-08-26 DIAGNOSIS — Z79891 Long term (current) use of opiate analgesic: Secondary | ICD-10-CM | POA: Diagnosis not present

## 2021-08-26 NOTE — Progress Notes (Signed)
Chronic Care Management Pharmacy Assistant   Name: Julie Neal  MRN: [id number] DOB: [birth year]  Reason for Encounter: Medication Review/General Adherence Call/PHQ9.   Recent office visits:  No recent office visit  Recent consult visits:  No recent consult visit  Hospital visits:  None in previous 6 months  Medications: Outpatient Encounter Medications as of 08/26/2021  Medication Sig   atorvastatin (LIPITOR) 40 MG tablet Take 1 tablet (40 mg total) by mouth daily.   buPROPion (WELLBUTRIN XL) 300 MG 24 hr tablet Take 1 tablet (300 mg total) by mouth daily.   CALCIUM CITRATE PO Take by mouth 2 (two) times daily.   Cholecalciferol (VITAMIN D3 PO) Take by mouth daily.   diazepam (VALIUM) 5 MG tablet Take 1 tablet (5 mg total) by mouth every 12 (twelve) hours as needed for muscle spasms (esophageal spasm).   DULoxetine (CYMBALTA) 60 MG capsule Take 1 capsule (60 mg total) by mouth daily.   minocycline (MINOCIN) 50 MG capsule Take 1 capsule by mouth twice daily   mupirocin ointment (BACTROBAN) 2 % Apply 1 application topically 2 (two) times daily. x7day   naloxone (NARCAN) nasal spray 4 mg/0.1 mL SMARTSIG:Spray(s) In Nostril   oxyCODONE-acetaminophen (PERCOCET) 10-325 MG tablet Take 1 tablet by mouth every 6 (six) hours as needed.   promethazine (PHENERGAN) 25 MG tablet Take 1 tablet (25 mg total) by mouth every 8 (eight) hours as needed for nausea or vomiting.   SUMAtriptan (IMITREX) 50 MG tablet Take 1 tablet (50 mg total) by mouth once as needed for up to 1 dose for migraine (may repeat x 1 tab in 1-2hrs if headache persists). May repeat in 2 hours if headache persists or recurs.   tiZANidine (ZANAFLEX) 4 MG tablet TAKE 1 TABLET BY MOUTH 4 TIMES A DAY AS NEEDED   traZODone (DESYREL) 100 MG tablet Take 2 tablets (200 mg total) by mouth at bedtime.   No facility-administered encounter medications on file as of 08/26/2021.    Care Gaps: [id number] Vaccine   Star Rating  Drugs: Atorvastatin 40 mg last filled 05/30/2021 for 90 day supply at [facility].   Medication Fill Gaps: None ID  Called patient and discussed medication adherence  with patient, no issues at this time with current medication.   Patient Denies ED visit since her last CPP follow up.  Patient Denies  any side effects with her medication. Patient Denies  any problems with hercurrent pharmacy  [place] Office Visit from 06/26/2021 in [place]  PHQ-9 Total Score 4      PHQ9 Screening   Not at all(0) Several days (1)More than half the days (2)Nearly every day(3)  1. Little interest or pleasure in doing things  0 2. Feeling down, depressed, or hopeless   0 3. Trouble falling or staying asleep, or sleeping too much  0 4. Feeling tired or having little energy   0 , patient states she is having lower back pain causing her to relax more. 5. Poor appetite or overeating   3 6. Feeling bad about yourself or that you are a failure or  have let yourself or your family down   0 7. Trouble concentrating on things, such as reading the  newspaper or watching television  0, patient states she enjoys playing her games on her Ipad, and reading the news on her Ipad. 8. Moving or speaking so slowly that other people could  have noticed. Or the opposite being so fidgety or  restless that you have been moving around a lot more  than usual  0 9. Thoughts that you would be better off dead, or of  hurting yourself   0, patient states "I love myself".  10. If you checked off any problems, how difficult have these problems made it for you to do  your work, take care of things at home, or get  along with other people?   Not difficult at all  Total: 3  Patient is requesting a refill for atorvastatin, and trazodone to go to [facility] on [address] in [place].   Telephone follow up appointment with Care management team member rescheduled for :  10/17/2021 at 11:45 am.  [place] Pharmacist Assistant [phone]

## 2021-08-26 NOTE — Progress Notes (Signed)
I reach out to [facility] to request refill for atorvastatin and trazodone per clinical pharmacist.  Per [place], they will be ready in one hour.Patient verbalized understanding.   [place] Pharmacist Assistant [phone]

## 2021-08-29 DIAGNOSIS — M5451 Vertebrogenic low back pain: Secondary | ICD-10-CM | POA: Diagnosis not present

## 2021-08-29 DIAGNOSIS — M25562 Pain in left knee: Secondary | ICD-10-CM | POA: Diagnosis not present

## 2021-09-11 DIAGNOSIS — M25562 Pain in left knee: Secondary | ICD-10-CM | POA: Diagnosis not present

## 2021-09-11 DIAGNOSIS — R2242 Localized swelling, mass and lump, left lower limb: Secondary | ICD-10-CM | POA: Diagnosis not present

## 2021-09-16 ENCOUNTER — Other Ambulatory Visit: Payer: Self-pay | Admitting: Nurse Practitioner

## 2021-09-16 DIAGNOSIS — F3341 Major depressive disorder, recurrent, in partial remission: Secondary | ICD-10-CM

## 2021-09-24 DIAGNOSIS — M47812 Spondylosis without myelopathy or radiculopathy, cervical region: Secondary | ICD-10-CM | POA: Diagnosis not present

## 2021-09-24 DIAGNOSIS — G894 Chronic pain syndrome: Secondary | ICD-10-CM | POA: Diagnosis not present

## 2021-09-24 DIAGNOSIS — M62830 Muscle spasm of back: Secondary | ICD-10-CM | POA: Diagnosis not present

## 2021-09-24 DIAGNOSIS — M47817 Spondylosis without myelopathy or radiculopathy, lumbosacral region: Secondary | ICD-10-CM | POA: Diagnosis not present

## 2021-10-10 ENCOUNTER — Telehealth: Payer: Self-pay

## 2021-10-10 NOTE — Progress Notes (Signed)
? ? ?Chronic Care Management ?Pharmacy Assistant  ? ?Name: Julie Neal  MRN: [id number] DOB: 12-28-1955 ? ?Reason for [place] Call. ?  ?Recent office visits:  ?No recent office visit ? ?Recent consult visits:  ?No recent consult visit ? ?Hospital visits:  ?None in previous 6 months ? ?Medications: ?Outpatient Encounter Medications as of 10/10/2021  ?Medication Sig  ? atorvastatin (LIPITOR) 40 MG tablet Take 1 tablet (40 mg total) by mouth daily.  ? buPROPion (WELLBUTRIN XL) 300 MG 24 hr tablet Take 1 tablet (300 mg total) by mouth daily.  ? CALCIUM CITRATE PO Take by mouth 2 (two) times daily.  ? Cholecalciferol (VITAMIN D3 PO) Take by mouth daily.  ? diazepam (VALIUM) 5 MG tablet Take 1 tablet (5 mg total) by mouth every 12 (twelve) hours as needed for muscle spasms (esophageal spasm).  ? DULoxetine (CYMBALTA) 60 MG capsule Take 1 capsule (60 mg total) by mouth daily.  ? minocycline (MINOCIN) 50 MG capsule Take 1 capsule by mouth twice daily  ? mupirocin ointment (BACTROBAN) 2 % Apply 1 application topically 2 (two) times daily. x7day  ? naloxone (NARCAN) nasal spray 4 mg/0.1 mL SMARTSIG:Spray(s) In Nostril  ? oxyCODONE-acetaminophen (PERCOCET) 10-325 MG tablet Take 1 tablet by mouth every 6 (six) hours as needed.  ? promethazine (PHENERGAN) 25 MG tablet Take 1 tablet (25 mg total) by mouth every 8 (eight) hours as needed for nausea or vomiting.  ? SUMAtriptan (IMITREX) 50 MG tablet Take 1 tablet (50 mg total) by mouth once as needed for up to 1 dose for migraine (may repeat x 1 tab in 1-2hrs if headache persists). May repeat in 2 hours if headache persists or recurs.  ? tiZANidine (ZANAFLEX) 4 MG tablet TAKE 1 TABLET BY MOUTH 4 TIMES A DAY AS NEEDED  ? traZODone (DESYREL) 100 MG tablet TAKE 2 TABLETS BY MOUTH IN THE EVENING AT BEDTIME  ? ?No facility-administered encounter medications on file as of 10/10/2021.  ? ? ?Care Gaps: ?COVID-19 Vaccine ?  ?Star Rating Drugs: ?Atorvastatin 40  mg last filled 08/22/2021 for 90 day supply at [facility]. ?  ?Medication Fill Gaps: ?None ID ? ?10/10/2021 ?Name: Julie Neal MRN: [id number] DOB: 1955-10-11 ?Julie Neal is a 66 y.o. year old female who is a primary care patient of Nche, Charlene Brooke, NP.  ?Comprehensive medication review performed; Spoke to patient regarding cholesterol ? ?Lipid Panel ?   ?Component Value Date/Time  ? CHOL 160 12/24/2020 0953  ? TRIG 149.0 12/24/2020 0953  ? HDL 47.00 12/24/2020 0953  ? [employer] 83 12/24/2020 0953  ? [place] 116 (H) 09/22/2019 1217  ?  ?10-year ASCVD risk score: The 10-year ASCVD risk score (Arnett DK, et al., 2019) is: 5.1% ?  Values used to calculate the score: ?    Age: 68 years ?    Sex: Female ?    Is Non-Hispanic African American: No ?    Diabetic: No ?    Tobacco smoker: No ?    Systolic Blood Pressure: 595 mmHg ?    Is BP treated: No ?    HDL Cholesterol: 47 mg/dL ?    Total Cholesterol: 160 mg/dL ? ?Current antihyperlipidemic regimen:  ?Atorvastatin 40 mg daily   ? ?Patient denies any issue/side effects from Atorvastatin. ? ?Previous antihyperlipidemic medications tried: None ID ? ?ASCVD risk enhancing conditions: age >11 ? ?What recent interventions/DTPs have been made by any provider to improve Cholesterol control since last CPP Visit:  None ID ? ?Any recent hospitalizations or ED visits since last visit with CPP? No ? ?What diet changes have been made to improve Cholesterol?  ?Patient denies any changes with her diet to improve her Cholesterol. ? ?What exercise is being done to improve Cholesterol?  ?Patient reports walking as much as she can depending on her pain. ? ?Adherence Review: ?Does the patient have >5 day gap between last estimated fill dates? No ? ?Telephone follow up appointment with Care management team member scheduled for : 01/23/2022 at 3:45 pm. ? ?Anderson Malta ?Clinical Pharmacist Assistant ?[phone]  ? ? ? ?

## 2021-10-14 ENCOUNTER — Telehealth: Payer: Medicare Other

## 2021-10-17 ENCOUNTER — Telehealth: Payer: Medicare Other

## 2021-10-22 DIAGNOSIS — M62830 Muscle spasm of back: Secondary | ICD-10-CM | POA: Diagnosis not present

## 2021-10-22 DIAGNOSIS — M47812 Spondylosis without myelopathy or radiculopathy, cervical region: Secondary | ICD-10-CM | POA: Diagnosis not present

## 2021-10-22 DIAGNOSIS — G894 Chronic pain syndrome: Secondary | ICD-10-CM | POA: Diagnosis not present

## 2021-10-22 DIAGNOSIS — M47817 Spondylosis without myelopathy or radiculopathy, lumbosacral region: Secondary | ICD-10-CM | POA: Diagnosis not present

## 2021-11-01 DIAGNOSIS — M25562 Pain in left knee: Secondary | ICD-10-CM | POA: Diagnosis not present

## 2021-11-04 ENCOUNTER — Other Ambulatory Visit: Payer: Self-pay | Admitting: Specialist

## 2021-11-04 DIAGNOSIS — M25562 Pain in left knee: Secondary | ICD-10-CM

## 2021-11-19 DIAGNOSIS — M47812 Spondylosis without myelopathy or radiculopathy, cervical region: Secondary | ICD-10-CM | POA: Diagnosis not present

## 2021-11-19 DIAGNOSIS — M62830 Muscle spasm of back: Secondary | ICD-10-CM | POA: Diagnosis not present

## 2021-11-19 DIAGNOSIS — G894 Chronic pain syndrome: Secondary | ICD-10-CM | POA: Diagnosis not present

## 2021-11-19 DIAGNOSIS — M47817 Spondylosis without myelopathy or radiculopathy, lumbosacral region: Secondary | ICD-10-CM | POA: Diagnosis not present

## 2021-11-21 ENCOUNTER — Other Ambulatory Visit: Payer: Self-pay | Admitting: Nurse Practitioner

## 2021-11-21 DIAGNOSIS — K224 Dyskinesia of esophagus: Secondary | ICD-10-CM

## 2021-11-22 ENCOUNTER — Other Ambulatory Visit: Payer: Self-pay

## 2021-11-22 DIAGNOSIS — G43719 Chronic migraine without aura, intractable, without status migrainosus: Secondary | ICD-10-CM

## 2021-11-22 MED ORDER — PROMETHAZINE HCL 25 MG PO TABS: 25.0000 mg | ORAL_TABLET | Freq: Three times a day (TID) | ORAL | 1 refills | Status: AC | PRN

## 2021-11-22 NOTE — Telephone Encounter (Signed)
Chart supports Rx 

## 2021-11-26 ENCOUNTER — Ambulatory Visit: Payer: Medicare Other

## 2021-11-29 ENCOUNTER — Ambulatory Visit
Admission: RE | Admit: 2021-11-29 | Discharge: 2021-11-29 | Disposition: A | Discharge status: Home / Self Care | Payer: Medicare Other | Source: Ambulatory Visit | Attending: Specialist | Admitting: Specialist

## 2021-11-29 DIAGNOSIS — M25562 Pain in left knee: Secondary | ICD-10-CM

## 2021-12-17 DIAGNOSIS — G894 Chronic pain syndrome: Secondary | ICD-10-CM | POA: Diagnosis not present

## 2021-12-17 DIAGNOSIS — M47817 Spondylosis without myelopathy or radiculopathy, lumbosacral region: Secondary | ICD-10-CM | POA: Diagnosis not present

## 2021-12-17 DIAGNOSIS — M47812 Spondylosis without myelopathy or radiculopathy, cervical region: Secondary | ICD-10-CM | POA: Diagnosis not present

## 2021-12-17 DIAGNOSIS — M62830 Muscle spasm of back: Secondary | ICD-10-CM | POA: Diagnosis not present

## 2021-12-25 ENCOUNTER — Ambulatory Visit: Payer: Medicare Other | Admitting: Nurse Practitioner

## 2021-12-25 ENCOUNTER — Telehealth: Payer: Self-pay | Admitting: Nurse Practitioner

## 2021-12-25 NOTE — Telephone Encounter (Signed)
Patient/Caregiver was notified of No Show/Late Cancellation Policy & possible $95 charge. ?Visit was cancelled with reason "No Show/Cancel within 24 hours" for tracking & charging. ? ?Caller Name: Rikita Grabert ?Caller Ph #: [phone] ?Date of APPT: 12/25/21 ?Reason given for no show/late cancellation: none ?No Show Letter printed & put in outgoing mail (Yes/No): yes ? ?~~~Route message to admin supervisor and clinical team/CMA~~~ ? ?  ?

## 2021-12-27 ENCOUNTER — Emergency Department (HOSPITAL_COMMUNITY): Payer: Medicare Other

## 2021-12-27 ENCOUNTER — Inpatient Hospital Stay (HOSPITAL_COMMUNITY)
Admission: EM | Admit: 2021-12-27 | Discharge: 2021-12-29 | DRG: 563 | Disposition: A | Discharge status: Home / Self Care | Payer: Medicare Other | Attending: Family Medicine | Admitting: Family Medicine

## 2021-12-27 ENCOUNTER — Other Ambulatory Visit: Payer: Self-pay

## 2021-12-27 DIAGNOSIS — G43909 Migraine, unspecified, not intractable, without status migrainosus: Secondary | ICD-10-CM | POA: Diagnosis present

## 2021-12-27 DIAGNOSIS — Z20822 Contact with and (suspected) exposure to covid-19: Secondary | ICD-10-CM | POA: Diagnosis present

## 2021-12-27 DIAGNOSIS — R6889 Other general symptoms and signs: Secondary | ICD-10-CM | POA: Diagnosis not present

## 2021-12-27 DIAGNOSIS — R42 Dizziness and giddiness: Secondary | ICD-10-CM

## 2021-12-27 DIAGNOSIS — F329 Major depressive disorder, single episode, unspecified: Secondary | ICD-10-CM | POA: Diagnosis not present

## 2021-12-27 DIAGNOSIS — Z8249 Family history of ischemic heart disease and other diseases of the circulatory system: Secondary | ICD-10-CM

## 2021-12-27 DIAGNOSIS — Z888 Allergy status to other drugs, medicaments and biological substances status: Secondary | ICD-10-CM

## 2021-12-27 DIAGNOSIS — Z23 Encounter for immunization: Secondary | ICD-10-CM

## 2021-12-27 DIAGNOSIS — W19XXXA Unspecified fall, initial encounter: Secondary | ICD-10-CM | POA: Diagnosis not present

## 2021-12-27 DIAGNOSIS — S63287A Dislocation of proximal interphalangeal joint of left little finger, initial encounter: Secondary | ICD-10-CM | POA: Diagnosis not present

## 2021-12-27 DIAGNOSIS — W1789XA Other fall from one level to another, initial encounter: Secondary | ICD-10-CM | POA: Diagnosis present

## 2021-12-27 DIAGNOSIS — K148 Other diseases of tongue: Secondary | ICD-10-CM | POA: Diagnosis not present

## 2021-12-27 DIAGNOSIS — R55 Syncope and collapse: Secondary | ICD-10-CM | POA: Diagnosis present

## 2021-12-27 DIAGNOSIS — Z87891 Personal history of nicotine dependence: Secondary | ICD-10-CM

## 2021-12-27 DIAGNOSIS — M797 Fibromyalgia: Secondary | ICD-10-CM | POA: Diagnosis present

## 2021-12-27 DIAGNOSIS — S62627A Displaced fracture of medial phalanx of left little finger, initial encounter for closed fracture: Secondary | ICD-10-CM | POA: Diagnosis not present

## 2021-12-27 DIAGNOSIS — S199XXA Unspecified injury of neck, initial encounter: Secondary | ICD-10-CM | POA: Diagnosis not present

## 2021-12-27 DIAGNOSIS — Z820 Family history of epilepsy and other diseases of the nervous system: Secondary | ICD-10-CM

## 2021-12-27 DIAGNOSIS — Z823 Family history of stroke: Secondary | ICD-10-CM

## 2021-12-27 DIAGNOSIS — Z880 Allergy status to penicillin: Secondary | ICD-10-CM

## 2021-12-27 DIAGNOSIS — S61207A Unspecified open wound of left little finger without damage to nail, initial encounter: Secondary | ICD-10-CM | POA: Diagnosis not present

## 2021-12-27 DIAGNOSIS — R296 Repeated falls: Secondary | ICD-10-CM | POA: Diagnosis present

## 2021-12-27 DIAGNOSIS — R404 Transient alteration of awareness: Secondary | ICD-10-CM | POA: Diagnosis not present

## 2021-12-27 DIAGNOSIS — S0990XA Unspecified injury of head, initial encounter: Secondary | ICD-10-CM | POA: Diagnosis not present

## 2021-12-27 DIAGNOSIS — Z833 Family history of diabetes mellitus: Secondary | ICD-10-CM

## 2021-12-27 DIAGNOSIS — E782 Mixed hyperlipidemia: Secondary | ICD-10-CM | POA: Diagnosis not present

## 2021-12-27 DIAGNOSIS — S63259A Unspecified dislocation of unspecified finger, initial encounter: Secondary | ICD-10-CM

## 2021-12-27 DIAGNOSIS — Z79899 Other long term (current) drug therapy: Secondary | ICD-10-CM

## 2021-12-27 DIAGNOSIS — F32A Depression, unspecified: Secondary | ICD-10-CM | POA: Diagnosis not present

## 2021-12-27 DIAGNOSIS — Y92009 Unspecified place in unspecified non-institutional (private) residence as the place of occurrence of the external cause: Secondary | ICD-10-CM

## 2021-12-27 DIAGNOSIS — S0993XA Unspecified injury of face, initial encounter: Secondary | ICD-10-CM | POA: Diagnosis not present

## 2021-12-27 DIAGNOSIS — R509 Fever, unspecified: Secondary | ICD-10-CM | POA: Diagnosis present

## 2021-12-27 DIAGNOSIS — M7989 Other specified soft tissue disorders: Secondary | ICD-10-CM | POA: Diagnosis not present

## 2021-12-27 DIAGNOSIS — Z743 Need for continuous supervision: Secondary | ICD-10-CM | POA: Diagnosis not present

## 2021-12-27 DIAGNOSIS — F419 Anxiety disorder, unspecified: Secondary | ICD-10-CM | POA: Diagnosis present

## 2021-12-27 DIAGNOSIS — R58 Hemorrhage, not elsewhere classified: Secondary | ICD-10-CM | POA: Diagnosis not present

## 2021-12-27 DIAGNOSIS — R220 Localized swelling, mass and lump, head: Secondary | ICD-10-CM | POA: Diagnosis not present

## 2021-12-27 LAB — BASIC METABOLIC PANEL
Anion gap: 6 (ref 5–15)
BUN: 20 mg/dL (ref 8–23)
CO2: 25 mmol/L (ref 22–32)
Calcium: 8.5 mg/dL — ABNORMAL LOW (ref 8.9–10.3)
Chloride: 108 mmol/L (ref 98–111)
Creatinine, Ser: 1.13 mg/dL — ABNORMAL HIGH (ref 0.44–1.00)
GFR, Estimated: 54 mL/min — ABNORMAL LOW (ref >60)
Glucose, Bld: 69 mg/dL — ABNORMAL LOW (ref 70–99)
Potassium: 3.5 mmol/L (ref 3.5–5.1)
Sodium: 139 mmol/L (ref 135–145)

## 2021-12-27 LAB — URINALYSIS, ROUTINE W REFLEX MICROSCOPIC
Bacteria, UA: NONE SEEN
Bilirubin Urine: NEGATIVE
Glucose, UA: NEGATIVE mg/dL
Hgb urine dipstick: NEGATIVE
Ketones, ur: NEGATIVE mg/dL
Nitrite: NEGATIVE
Protein, ur: NEGATIVE mg/dL
Specific Gravity, Urine: 1.02 (ref 1.005–1.030)
pH: 5 (ref 5.0–8.0)

## 2021-12-27 LAB — CBC WITH DIFFERENTIAL/PLATELET
Abs Immature Granulocytes: 0.06 10*3/uL (ref 0.00–0.07)
Basophils Absolute: 0 10*3/uL (ref 0.0–0.1)
Basophils Relative: 0 %
Eosinophils Absolute: 0.2 10*3/uL (ref 0.0–0.5)
Eosinophils Relative: 3 %
HCT: 36.2 % (ref 36.0–46.0)
Hemoglobin: 11.7 g/dL — ABNORMAL LOW (ref 12.0–15.0)
Immature Granulocytes: 1 %
Lymphocytes Relative: 19 %
Lymphs Abs: 1.5 10*3/uL (ref 0.7–4.0)
MCH: 30.9 pg (ref 26.0–34.0)
MCHC: 32.3 g/dL (ref 30.0–36.0)
MCV: 95.5 fL (ref 80.0–100.0)
Monocytes Absolute: 0.7 10*3/uL (ref 0.1–1.0)
Monocytes Relative: 9 %
Neutro Abs: 5.4 10*3/uL (ref 1.7–7.7)
Neutrophils Relative %: 68 %
Platelets: 182 10*3/uL (ref 150–400)
RBC: 3.79 MIL/uL — ABNORMAL LOW (ref 3.87–5.11)
RDW: 12.4 % (ref 11.5–15.5)
WBC: 7.9 10*3/uL (ref 4.0–10.5)
nRBC: 0 % (ref 0.0–0.2)

## 2021-12-27 LAB — CBG MONITORING, ED: Glucose-Capillary: 110 mg/dL — ABNORMAL HIGH (ref 70–99)

## 2021-12-27 MED ORDER — FENTANYL CITRATE PF 50 MCG/ML IJ SOSY
50.0000 ug | PREFILLED_SYRINGE | Freq: Once | INTRAMUSCULAR | Status: AC
  Administered 2021-12-27: 50 ug via INTRAVENOUS
  Filled 2021-12-27: qty 1

## 2021-12-27 MED ORDER — TETANUS-DIPHTH-ACELL PERTUSSIS 5-2.5-18.5 LF-MCG/0.5 IM SUSY
0.5000 mL | PREFILLED_SYRINGE | Freq: Once | INTRAMUSCULAR | Status: AC
  Administered 2021-12-27: 0.5 mL via INTRAMUSCULAR
  Filled 2021-12-27: qty 0.5

## 2021-12-27 MED ORDER — LIDOCAINE HCL (PF) 1 % IJ SOLN
INTRAMUSCULAR | Status: AC
  Administered 2021-12-27: 30 mL
  Filled 2021-12-27: qty 30

## 2021-12-27 MED ORDER — LIDOCAINE HCL (PF) 1 % IJ SOLN
10.0000 mL | Freq: Once | INTRAMUSCULAR | Status: AC
Start: 2021-12-27 — End: 2021-12-27
  Administered 2021-12-27: 10 mL
  Filled 2021-12-27: qty 10

## 2021-12-27 MED ORDER — VANCOMYCIN HCL 1250 MG/250ML IV SOLN
1250.0000 mg | INTRAVENOUS | Status: DC
  Administered 2021-12-28: 1250 mg via INTRAVENOUS
  Filled 2021-12-27 (×2): qty 250

## 2021-12-27 MED ORDER — VANCOMYCIN HCL 1500 MG/300ML IV SOLN
1500.0000 mg | Freq: Once | INTRAVENOUS | Status: AC
  Administered 2021-12-27: 1500 mg via INTRAVENOUS
  Filled 2021-12-27: qty 300

## 2021-12-27 MED ORDER — CLINDAMYCIN HCL 300 MG PO CAPS: 300.0000 mg | ORAL_CAPSULE | Freq: Three times a day (TID) | ORAL | 0 refills | Status: AC

## 2021-12-27 NOTE — Discharge Instructions (Signed)
Keep your finger bandage on, clean and dry until your follow-up with Dr. [place] Silos Take your antibiotics until gone

## 2021-12-27 NOTE — Consult Note (Addendum)
ORTHOPAEDIC CONSULTATION HISTORY & PHYSICAL REQUESTING PHYSICIAN: Sherwood Gambler, MD  Chief Complaint: L SF open PIP dx  HPI: Julie Neal is a 66 y.o. female who sustained a mechanical fall in the late afternoon.  Arrived at ED 1744.  Has open L SF PIP dislocation.  I received consult page at 2056/10/29, where is was relayed that she was receiving IV antibiotics, but the wound had not been cleansed and the joint remained dislocated.  I was requested to assume care for the injury.  Upon arrival, I confirmed this history, examined the patient, and performed a digital block.  Past Medical History:  Diagnosis Date   Anxiety    Asymptomatic stenosis of right carotid artery    1-30%   Cervicalgia    Depression    Fibromyalgia    Fracture of ankle, bimalleolar, left, closed 30/02/6225   Helicobacter pylori gastritis 29-Oct-2016   Hyperlipidemia    Left leg weakness    Lumbago    Memory loss    Migraines    Neuralgic migraines    Osteoporosis    Past Surgical History:  Procedure Laterality Date   ABDOMINAL HYSTERECTOMY     BACK SURGERY     HAND SURGERY Bilateral    LEG SURGERY Bilateral    NECK SURGERY     SHOULDER SURGERY Left    Social History   Socioeconomic History   Marital status: Widowed    Spouse name: [name]   Number of children: 1   Years of education: college   Highest education level: Not on file  Occupational History   Occupation: Retired    Comment: disabled  Tobacco Use   Smoking status: Former    Types: Cigarettes   Smokeless tobacco: Never   Tobacco comments:    Quit in [place] Use: Former  Substance and Sexual Activity   Alcohol use: No    Alcohol/week: 0.0 standard drinks   Drug use: No   Sexual activity: Not on file  Other Topics Concern   Not on file  Social History Narrative   Has a college education. Husband died 2019-10-30 (pulmonary fibrosis).  Patient has one child.   Patient is disabled. Right handed.   Caffeine one cup of  coffee.   Social Determinants of Health   Financial Resource Strain: Low Risk    Difficulty of Paying Living Expenses: Not hard at all  Food Insecurity: Not on file  Transportation Needs: Not on file  Physical Activity: Not on file  Stress: Not on file  Social Connections: Not on file   Family History  Problem Relation Age of Onset   Heart attack Father    Heart disease Father 67   Alzheimer's disease Mother    Stroke Mother    Diabetes type II Brother    Breast cancer Sister    Allergies  Allergen Reactions   Mobic [Meloxicam] Anaphylaxis   Penicillins Anaphylaxis   Nubain [Nalbuphine Hcl] Diarrhea and Nausea And Vomiting   Prior to Admission medications   Medication Sig Start Date End Date Taking? Authorizing Provider  atorvastatin (LIPITOR) 40 MG tablet Take 1 tablet (40 mg total) by mouth daily. 06/26/21   Nche, Charlene Brooke, NP  buPROPion (WELLBUTRIN XL) 300 MG 24 hr tablet Take 1 tablet (300 mg total) by mouth daily. 06/26/21   Nche, Charlene Brooke, NP  CALCIUM CITRATE PO Take by mouth 2 (two) times daily.    [provider]  Cholecalciferol (VITAMIN D3  PO) Take by mouth daily.    [provider]  diazepam (VALIUM) 5 MG tablet TAKE 1 TABLET BY MOUTH EVERY 12 HOURS AS NEEDED FOR MUSCLE SPASM (ESOPHAGEAL  SPASM) 11/26/21   Nche, Charlene Brooke, NP  DULoxetine (CYMBALTA) 60 MG capsule Take 1 capsule (60 mg total) by mouth daily. 06/26/21   Nche, Charlene Brooke, NP  minocycline (MINOCIN) 50 MG capsule Take 1 capsule by mouth twice daily 07/11/21   Lavonna Monarch, MD  mupirocin ointment (BACTROBAN) 2 % Apply 1 application topically 2 (two) times daily. x7day 12/24/20   NcheCharlene Brooke, NP  naloxone [facility]) nasal spray 4 mg/0.1 mL SMARTSIG:Spray(s) In Nostril 02/09/21   [provider]  oxyCODONE-acetaminophen (PERCOCET) 10-325 MG tablet Take 1 tablet by mouth every 6 (six) hours as needed. 02/09/21   [provider]  promethazine (PHENERGAN) 25  MG tablet Take 1 tablet (25 mg total) by mouth every 8 (eight) hours as needed for nausea or vomiting. 11/22/21   Nche, Charlene Brooke, NP  SUMAtriptan (IMITREX) 50 MG tablet Take 1 tablet (50 mg total) by mouth once as needed for up to 1 dose for migraine (may repeat x 1 tab in 1-2hrs if headache persists). May repeat in 2 hours if headache persists or recurs. 04/02/21   Nche, Charlene Brooke, NP  tiZANidine (ZANAFLEX) 4 MG tablet TAKE 1 TABLET BY MOUTH 4 TIMES A DAY AS NEEDED 02/17/17   [provider]  traZODone (DESYREL) 100 MG tablet TAKE 2 TABLETS BY MOUTH IN THE EVENING AT BEDTIME 09/16/21   Nche, Charlene Brooke, NP   CT Head Wo Contrast  Result Date: 12/27/2021 CLINICAL DATA:  Head trauma, moderate-severe; Neck trauma (Age >= 65y); Facial trauma, blunt. Fall from standing, head injury, loss of consciousness. EXAM: CT HEAD WITHOUT CONTRAST CT MAXILLOFACIAL WITHOUT CONTRAST CT CERVICAL SPINE WITHOUT CONTRAST TECHNIQUE: Multidetector CT imaging of the head, cervical spine, and maxillofacial structures were performed using the standard protocol without intravenous contrast. Multiplanar CT image reconstructions of the cervical spine and maxillofacial structures were also generated. RADIATION DOSE REDUCTION: This exam was performed according to the departmental dose-optimization program which includes automated exposure control, adjustment of the mA and/or kV according to patient size and/or use of iterative reconstruction technique. COMPARISON:  None Available. FINDINGS: CT HEAD FINDINGS Brain: Normal anatomic configuration. Parenchymal volume loss is commensurate with the patient's age. Mild subcortical and periventricular white matter changes are present likely reflecting the sequela of small vessel ischemia. No abnormal intra or extra-axial mass lesion or fluid collection. No abnormal mass effect or midline shift. No evidence of acute intracranial hemorrhage or infarct. Ventricular size is normal.  Cerebellum unremarkable. Vascular: No asymmetric hyperdense vasculature at the skull base. Skull: Intact Other: Mastoid air cells and middle ear cavities are clear. CT MAXILLOFACIAL FINDINGS Osseous: Imaging is slightly limited by motion artifact. No fracture or mandibular dislocation. No destructive process. Orbits: Negative. No traumatic or inflammatory finding. Sinuses: Small air-fluid level within the right maxillary sinus is nonspecific. The remaining paranasal sinuses are clear. Soft tissues: Moderate right preseptal soft tissue swelling. Mild soft tissue swelling of the subcutaneous fat of the right buccal soft tissues. CT CERVICAL SPINE FINDINGS Alignment: Imaging is slightly limited by motion artifact. Normal alignment. No listhesis. Skull base and vertebrae: Craniocervical alignment is normal. The atlantodental interval is not widened. No acute fracture of the cervical spine. Anterior cervical discectomy and fusion with instrumentation of C5-6 has been performed with solid incorporation of interbody bone graft. Soft  tissues and spinal canal: Dorsal column stimulator leads are seen with their terminal pads extending from the level of the foramen magnum posterior to the C2 and C3 vertebral bodies. Streak artifact related to the pass slightly limits evaluation of the spinal canal in this region. There is moderate central canal stenosis at C2-3 secondary to osteophytic ridging in combination with the device. No canal hematoma. No prevertebral soft tissue swelling or paraspinal fluid collections identified. Disc levels: There is intervertebral disc space narrowing and endplate remodeling at W1-1 in keeping with changes of moderate degenerative disc disease. Remaining intervertebral disc heights are preserved. Prevertebral soft tissues are not thickened on sagittal reformats. Review of the axial images demonstrates moderate right neuroforaminal narrowing at C2-3 secondary to uncovertebral and facet arthrosis,  mild bilateral neuroforaminal narrowing at C3-4 secondary to facet arthrosis, mild bilateral neuroforaminal narrowing at C6-7 secondary to uncovertebral arthrosis. Upper chest: Unremarkable Other: None IMPRESSION: No acute intracranial injury.  No calvarial fracture. Moderate right preseptal and mild right facial soft tissue swelling. No facial fracture or mandibular dislocation. Small air-fluid level within the right maxillary sinus, nonspecific. No acute fracture or listhesis of the cervical spine. Electronically Signed   By: Fidela Salisbury M.D.   On: 12/27/2021 19:56   CT Cervical Spine Wo Contrast  Result Date: 12/27/2021 CLINICAL DATA:  Head trauma, moderate-severe; Neck trauma (Age >= 65y); Facial trauma, blunt. Fall from standing, head injury, loss of consciousness. EXAM: CT HEAD WITHOUT CONTRAST CT MAXILLOFACIAL WITHOUT CONTRAST CT CERVICAL SPINE WITHOUT CONTRAST TECHNIQUE: Multidetector CT imaging of the head, cervical spine, and maxillofacial structures were performed using the standard protocol without intravenous contrast. Multiplanar CT image reconstructions of the cervical spine and maxillofacial structures were also generated. RADIATION DOSE REDUCTION: This exam was performed according to the departmental dose-optimization program which includes automated exposure control, adjustment of the mA and/or kV according to patient size and/or use of iterative reconstruction technique. COMPARISON:  None Available. FINDINGS: CT HEAD FINDINGS Brain: Normal anatomic configuration. Parenchymal volume loss is commensurate with the patient's age. Mild subcortical and periventricular white matter changes are present likely reflecting the sequela of small vessel ischemia. No abnormal intra or extra-axial mass lesion or fluid collection. No abnormal mass effect or midline shift. No evidence of acute intracranial hemorrhage or infarct. Ventricular size is normal. Cerebellum unremarkable. Vascular: No asymmetric  hyperdense vasculature at the skull base. Skull: Intact Other: Mastoid air cells and middle ear cavities are clear. CT MAXILLOFACIAL FINDINGS Osseous: Imaging is slightly limited by motion artifact. No fracture or mandibular dislocation. No destructive process. Orbits: Negative. No traumatic or inflammatory finding. Sinuses: Small air-fluid level within the right maxillary sinus is nonspecific. The remaining paranasal sinuses are clear. Soft tissues: Moderate right preseptal soft tissue swelling. Mild soft tissue swelling of the subcutaneous fat of the right buccal soft tissues. CT CERVICAL SPINE FINDINGS Alignment: Imaging is slightly limited by motion artifact. Normal alignment. No listhesis. Skull base and vertebrae: Craniocervical alignment is normal. The atlantodental interval is not widened. No acute fracture of the cervical spine. Anterior cervical discectomy and fusion with instrumentation of C5-6 has been performed with solid incorporation of interbody bone graft. Soft tissues and spinal canal: Dorsal column stimulator leads are seen with their terminal pads extending from the level of the foramen magnum posterior to the C2 and C3 vertebral bodies. Streak artifact related to the pass slightly limits evaluation of the spinal canal in this region. There is moderate central canal stenosis at C2-3 secondary to osteophytic  ridging in combination with the device. No canal hematoma. No prevertebral soft tissue swelling or paraspinal fluid collections identified. Disc levels: There is intervertebral disc space narrowing and endplate remodeling at G2-6 in keeping with changes of moderate degenerative disc disease. Remaining intervertebral disc heights are preserved. Prevertebral soft tissues are not thickened on sagittal reformats. Review of the axial images demonstrates moderate right neuroforaminal narrowing at C2-3 secondary to uncovertebral and facet arthrosis, mild bilateral neuroforaminal narrowing at C3-4  secondary to facet arthrosis, mild bilateral neuroforaminal narrowing at C6-7 secondary to uncovertebral arthrosis. Upper chest: Unremarkable Other: None IMPRESSION: No acute intracranial injury.  No calvarial fracture. Moderate right preseptal and mild right facial soft tissue swelling. No facial fracture or mandibular dislocation. Small air-fluid level within the right maxillary sinus, nonspecific. No acute fracture or listhesis of the cervical spine. Electronically Signed   By: Fidela Salisbury M.D.   On: 12/27/2021 19:56   DG Hand Complete Left  Result Date: 12/27/2021 CLINICAL DATA:  Open little finger fracture.  Fall. EXAM: LEFT HAND - COMPLETE 3+ VIEW COMPARISON:  Radiograph 01/29/2019 FINDINGS: There is dorsal dislocation and ulnar subluxation of the fifth digit at the proximal interphalangeal joint. The middle phalanx is perched on the proximal phalanx. There is air in the adjacent soft tissues. No definite fracture is seen. No additional acute abnormality of the hand. Mild multifocal osteoarthritis. IMPRESSION: Dorsal dislocation and ulnar subluxation of the fifth digit at the proximal interphalangeal joint. No definite fracture. Air in the adjacent soft tissues may indicate breech of the skin/open injury. Electronically Signed   By: Keith Rake M.D.   On: 12/27/2021 19:27   CT Maxillofacial WO CM  Result Date: 12/27/2021 CLINICAL DATA:  Head trauma, moderate-severe; Neck trauma (Age >= 65y); Facial trauma, blunt. Fall from standing, head injury, loss of consciousness. EXAM: CT HEAD WITHOUT CONTRAST CT MAXILLOFACIAL WITHOUT CONTRAST CT CERVICAL SPINE WITHOUT CONTRAST TECHNIQUE: Multidetector CT imaging of the head, cervical spine, and maxillofacial structures were performed using the standard protocol without intravenous contrast. Multiplanar CT image reconstructions of the cervical spine and maxillofacial structures were also generated. RADIATION DOSE REDUCTION: This exam was performed  according to the departmental dose-optimization program which includes automated exposure control, adjustment of the mA and/or kV according to patient size and/or use of iterative reconstruction technique. COMPARISON:  None Available. FINDINGS: CT HEAD FINDINGS Brain: Normal anatomic configuration. Parenchymal volume loss is commensurate with the patient's age. Mild subcortical and periventricular white matter changes are present likely reflecting the sequela of small vessel ischemia. No abnormal intra or extra-axial mass lesion or fluid collection. No abnormal mass effect or midline shift. No evidence of acute intracranial hemorrhage or infarct. Ventricular size is normal. Cerebellum unremarkable. Vascular: No asymmetric hyperdense vasculature at the skull base. Skull: Intact Other: Mastoid air cells and middle ear cavities are clear. CT MAXILLOFACIAL FINDINGS Osseous: Imaging is slightly limited by motion artifact. No fracture or mandibular dislocation. No destructive process. Orbits: Negative. No traumatic or inflammatory finding. Sinuses: Small air-fluid level within the right maxillary sinus is nonspecific. The remaining paranasal sinuses are clear. Soft tissues: Moderate right preseptal soft tissue swelling. Mild soft tissue swelling of the subcutaneous fat of the right buccal soft tissues. CT CERVICAL SPINE FINDINGS Alignment: Imaging is slightly limited by motion artifact. Normal alignment. No listhesis. Skull base and vertebrae: Craniocervical alignment is normal. The atlantodental interval is not widened. No acute fracture of the cervical spine. Anterior cervical discectomy and fusion with instrumentation of C5-6 has been  performed with solid incorporation of interbody bone graft. Soft tissues and spinal canal: Dorsal column stimulator leads are seen with their terminal pads extending from the level of the foramen magnum posterior to the C2 and C3 vertebral bodies. Streak artifact related to the pass  slightly limits evaluation of the spinal canal in this region. There is moderate central canal stenosis at C2-3 secondary to osteophytic ridging in combination with the device. No canal hematoma. No prevertebral soft tissue swelling or paraspinal fluid collections identified. Disc levels: There is intervertebral disc space narrowing and endplate remodeling at O9-6 in keeping with changes of moderate degenerative disc disease. Remaining intervertebral disc heights are preserved. Prevertebral soft tissues are not thickened on sagittal reformats. Review of the axial images demonstrates moderate right neuroforaminal narrowing at C2-3 secondary to uncovertebral and facet arthrosis, mild bilateral neuroforaminal narrowing at C3-4 secondary to facet arthrosis, mild bilateral neuroforaminal narrowing at C6-7 secondary to uncovertebral arthrosis. Upper chest: Unremarkable Other: None IMPRESSION: No acute intracranial injury.  No calvarial fracture. Moderate right preseptal and mild right facial soft tissue swelling. No facial fracture or mandibular dislocation. Small air-fluid level within the right maxillary sinus, nonspecific. No acute fracture or listhesis of the cervical spine. Electronically Signed   By: Fidela Salisbury M.D.   On: 12/27/2021 19:56    Positive ROS: All other systems have been reviewed and were otherwise negative with the exception of those mentioned in the HPI and as above.  Physical Exam: Vitals: Refer to EMR. Constitutional:  WD, WN, NAD HEENT:  R eye contusion, EOMI Neuro/Psych:  Alert & oriented, appropriate mood & affect Lymphatic: No generalized extremity edema or lymphadenopathy Extremities / MSK:  The extremities are normal with respect to appearance, ranges of motion, joint stability, muscle strength/tone, sensation, & perfusion except as otherwise noted:  L SF with small volar open wound, not clear if chondral surfaces exposed as dried blood covers it.  Intact LT sens R/U digit tip,  with intact flexion of DIPJ.  The adjacent ring finger is ecchymotic about the P1 of the ring finger  After properly anesthetized, the volar aspect of the condyles of the proximal phalanx were visible in the wound.  Assessment: Open left small finger dorsal PIP dislocation  Plan/Procedure (reduction of left small finger PIP dislocation with a digital block and 1 cm traumatic simple wound closure): Following digital block, I received verbal consent from the patient to proceed with attempted closed reduction, which was done successfully.  The joint had smooth and fluid movement and normal alignment.  She was able to flex and extend at all the joints of the digit, with strong extension of the PIP against resistance.  The wound was copiously irrigated and then prepped and draped.  2 different 4-0 Vicryl Rapide sutures were used to loosely reapproximate the open 1 cm wound.  A dressing was applied with a dorsal aluminum splint component, placing the PIP joint in about 45 degrees of flexion.  Postreduction radiographs are pending.  Provided the radiographs reveal acceptable closed reduction for discharge, she will be discharged with typical instructions, reiterated with her the importance of beginning on the oral antibiotics right away, and my office will contact her on Monday to arrange for continued care.  Questions were invited and answered.  IF PATIENT IS ADMITTED, COULD BENEFIT FROM ADDITIONAL 24 HOURS OF IV ANTIBIOTICS BEFORE TRANSITION TO ORAL ANTIBIOTICS FOR REST OF A 7 DAY COURSE  Trevar Boehringer A. [address], [place]  [address], [place]  [address] Office: [phone] Mobile: [phone]  12/27/2021, 9:36 PM

## 2021-12-27 NOTE — ED Provider Notes (Signed)
[facility] EMERGENCY DEPARTMENT Provider Note   CSN: [id number] Arrival date & time: 12/27/21  1744     History  Chief Complaint  Patient presents with   Loss of Consciousness   Bogue Chitto is a 66 y.o. female. With past medical history of HLD, migraines who presents to the emergency department with loss of consciousness.  Patient states that she was outside on the front porch going to get a mail package when she became dizzy, lightheaded and had loss of consciousness.  She states that she fell off of the porch striking the front of her head and injuring her left hand.  She denies any chest pain, shortness of breath, palpitations, nausea or diaphoresis prior to or after this incident.  She is currently not dizzy.  Her and family at bedside states that she has fallen multiple times previously and she has had dizziness previously but it does not appear that she has ever been seen in the emergency department for this.  She is not anticoagulated.   Loss of Consciousness Associated symptoms: dizziness   Fall      Home Medications Prior to Admission medications   Medication Sig Start Date End Date Taking? Authorizing Provider  atorvastatin (LIPITOR) 40 MG tablet Take 1 tablet (40 mg total) by mouth daily. 06/26/21   Nche, Charlene Brooke, NP  buPROPion (WELLBUTRIN XL) 300 MG 24 hr tablet Take 1 tablet (300 mg total) by mouth daily. 06/26/21   Nche, Charlene Brooke, NP  CALCIUM CITRATE PO Take by mouth 2 (two) times daily.    [provider]  Cholecalciferol (VITAMIN D3 PO) Take by mouth daily.    [provider]  diazepam (VALIUM) 5 MG tablet TAKE 1 TABLET BY MOUTH EVERY 12 HOURS AS NEEDED FOR MUSCLE SPASM (ESOPHAGEAL  SPASM) 11/26/21   Nche, Charlene Brooke, NP  DULoxetine (CYMBALTA) 60 MG capsule Take 1 capsule (60 mg total) by mouth daily. 06/26/21   Nche, Charlene Brooke, NP  minocycline (MINOCIN) 50 MG capsule Take 1 capsule by mouth twice  daily 07/11/21   Lavonna Monarch, MD  mupirocin ointment (BACTROBAN) 2 % Apply 1 application topically 2 (two) times daily. x7day 12/24/20   NcheCharlene Brooke, NP  naloxone [facility]) nasal spray 4 mg/0.1 mL SMARTSIG:Spray(s) In Nostril 02/09/21   [provider]  oxyCODONE-acetaminophen (PERCOCET) 10-325 MG tablet Take 1 tablet by mouth every 6 (six) hours as needed. 02/09/21   [provider]  promethazine (PHENERGAN) 25 MG tablet Take 1 tablet (25 mg total) by mouth every 8 (eight) hours as needed for nausea or vomiting. 11/22/21   Nche, Charlene Brooke, NP  SUMAtriptan (IMITREX) 50 MG tablet Take 1 tablet (50 mg total) by mouth once as needed for up to 1 dose for migraine (may repeat x 1 tab in 1-2hrs if headache persists). May repeat in 2 hours if headache persists or recurs. 04/02/21   Nche, Charlene Brooke, NP  tiZANidine (ZANAFLEX) 4 MG tablet TAKE 1 TABLET BY MOUTH 4 TIMES A DAY AS NEEDED 02/17/17   [provider]  traZODone (DESYREL) 100 MG tablet TAKE 2 TABLETS BY MOUTH IN THE EVENING AT BEDTIME 09/16/21   Nche, Charlene Brooke, NP      Allergies    Mobic [meloxicam], Penicillins, and Nubain [nalbuphine hcl]    Review of Systems   Review of Systems  Cardiovascular:  Positive for syncope.  Musculoskeletal:  Positive for arthralgias, joint swelling and neck pain.  Skin:  Positive for wound.  Neurological:  Positive for dizziness, syncope and light-headedness.  All other systems reviewed and are negative.  Physical Exam Updated Vital Signs Pulse 71   Temp 98.5 F (36.9 C) (Oral)   Ht '5\' 7"'$  (1.702 m)   Wt 72.6 kg   BMI 25.06 kg/m  Physical Exam Vitals and nursing note reviewed.  Constitutional:      General: She is not in acute distress.    Appearance: Normal appearance. She is normal weight. She is ill-appearing.  HENT:     Head: Normocephalic.     Nose: Nose normal.     Mouth/Throat:     Mouth: Mucous membranes are moist.     Pharynx: Oropharynx is clear.   Eyes:     General: No scleral icterus.    Extraocular Movements: Extraocular movements intact.  Cardiovascular:     Rate and Rhythm: Normal rate and regular rhythm.     Pulses: Normal pulses.     Heart sounds: No murmur heard. Pulmonary:     Effort: Pulmonary effort is normal. No respiratory distress.     Breath sounds: Normal breath sounds.  Abdominal:     General: Bowel sounds are normal.     Palpations: Abdomen is soft.     Tenderness: There is no abdominal tenderness.  Musculoskeletal:        General: Deformity and signs of injury present.     Cervical back: Neck supple. Tenderness present.     Comments: Open fracture of the left little finger Bilateral radial pulses 2+.  Cap refill less than 2 seconds.  Neurovascularly intact  Abrasion to the right elbow Hematoma to the right eyebrow  Skin:    General: Skin is warm and dry.     Capillary Refill: Capillary refill takes less than 2 seconds.     Findings: Bruising present.  Neurological:     General: No focal deficit present.     Mental Status: She is alert and oriented to person, place, and time. Mental status is at baseline.     Cranial Nerves: No cranial nerve deficit.     Sensory: No sensory deficit.     Motor: No weakness.  Psychiatric:        Mood and Affect: Mood normal.        Behavior: Behavior normal.        Thought Content: Thought content normal.        Judgment: Judgment normal.    ED Results / Procedures / Treatments   Labs (all labs ordered are listed, but only abnormal results are displayed) Labs Reviewed  BASIC METABOLIC PANEL - Abnormal; Notable for the following components:      Result Value   Glucose, Bld 69 (*)    Creatinine, Ser 1.13 (*)    Calcium 8.5 (*)    GFR, Estimated 54 (*)    All other components within normal limits  CBC WITH DIFFERENTIAL/PLATELET - Abnormal; Notable for the following components:   RBC 3.79 (*)    Hemoglobin 11.7 (*)    All other components within normal limits   URINALYSIS, ROUTINE W REFLEX MICROSCOPIC - Abnormal; Notable for the following components:   Leukocytes,Ua LARGE (*)    All other components within normal limits  CBG MONITORING, ED   EKG EKG Interpretation  Date/Time:  Friday Dec 27 2021 20:15:16 EDT Ventricular Rate:  81 PR Interval:  138 QRS Duration: 98 QT Interval:  364 QTC Calculation: 423 R Axis:  13 Text Interpretation: Sinus rhythm Low voltage, precordial leads Abnormal R-wave progression, early transition similar to 2012 Confirmed by Sherwood Gambler [id number]) on 12/27/2021 8:20:46 PM  Radiology CT Head Wo Contrast  Result Date: 12/27/2021 CLINICAL DATA:  Head trauma, moderate-severe; Neck trauma (Age >= 65y); Facial trauma, blunt. Fall from standing, head injury, loss of consciousness. EXAM: CT HEAD WITHOUT CONTRAST CT MAXILLOFACIAL WITHOUT CONTRAST CT CERVICAL SPINE WITHOUT CONTRAST TECHNIQUE: Multidetector CT imaging of the head, cervical spine, and maxillofacial structures were performed using the standard protocol without intravenous contrast. Multiplanar CT image reconstructions of the cervical spine and maxillofacial structures were also generated. RADIATION DOSE REDUCTION: This exam was performed according to the departmental dose-optimization program which includes automated exposure control, adjustment of the mA and/or kV according to patient size and/or use of iterative reconstruction technique. COMPARISON:  None Available. FINDINGS: CT HEAD FINDINGS Brain: Normal anatomic configuration. Parenchymal volume loss is commensurate with the patient's age. Mild subcortical and periventricular white matter changes are present likely reflecting the sequela of small vessel ischemia. No abnormal intra or extra-axial mass lesion or fluid collection. No abnormal mass effect or midline shift. No evidence of acute intracranial hemorrhage or infarct. Ventricular size is normal. Cerebellum unremarkable. Vascular: No asymmetric hyperdense  vasculature at the skull base. Skull: Intact Other: Mastoid air cells and middle ear cavities are clear. CT MAXILLOFACIAL FINDINGS Osseous: Imaging is slightly limited by motion artifact. No fracture or mandibular dislocation. No destructive process. Orbits: Negative. No traumatic or inflammatory finding. Sinuses: Small air-fluid level within the right maxillary sinus is nonspecific. The remaining paranasal sinuses are clear. Soft tissues: Moderate right preseptal soft tissue swelling. Mild soft tissue swelling of the subcutaneous fat of the right buccal soft tissues. CT CERVICAL SPINE FINDINGS Alignment: Imaging is slightly limited by motion artifact. Normal alignment. No listhesis. Skull base and vertebrae: Craniocervical alignment is normal. The atlantodental interval is not widened. No acute fracture of the cervical spine. Anterior cervical discectomy and fusion with instrumentation of C5-6 has been performed with solid incorporation of interbody bone graft. Soft tissues and spinal canal: Dorsal column stimulator leads are seen with their terminal pads extending from the level of the foramen magnum posterior to the C2 and C3 vertebral bodies. Streak artifact related to the pass slightly limits evaluation of the spinal canal in this region. There is moderate central canal stenosis at C2-3 secondary to osteophytic ridging in combination with the device. No canal hematoma. No prevertebral soft tissue swelling or paraspinal fluid collections identified. Disc levels: There is intervertebral disc space narrowing and endplate remodeling at X3-2 in keeping with changes of moderate degenerative disc disease. Remaining intervertebral disc heights are preserved. Prevertebral soft tissues are not thickened on sagittal reformats. Review of the axial images demonstrates moderate right neuroforaminal narrowing at C2-3 secondary to uncovertebral and facet arthrosis, mild bilateral neuroforaminal narrowing at C3-4 secondary to  facet arthrosis, mild bilateral neuroforaminal narrowing at C6-7 secondary to uncovertebral arthrosis. Upper chest: Unremarkable Other: None IMPRESSION: No acute intracranial injury.  No calvarial fracture. Moderate right preseptal and mild right facial soft tissue swelling. No facial fracture or mandibular dislocation. Small air-fluid level within the right maxillary sinus, nonspecific. No acute fracture or listhesis of the cervical spine. Electronically Signed   By: Fidela Salisbury M.D.   On: 12/27/2021 19:56   CT Cervical Spine Wo Contrast  Result Date: 12/27/2021 CLINICAL DATA:  Head trauma, moderate-severe; Neck trauma (Age >= 65y); Facial trauma, blunt. Fall from standing, head injury, loss of  consciousness. EXAM: CT HEAD WITHOUT CONTRAST CT MAXILLOFACIAL WITHOUT CONTRAST CT CERVICAL SPINE WITHOUT CONTRAST TECHNIQUE: Multidetector CT imaging of the head, cervical spine, and maxillofacial structures were performed using the standard protocol without intravenous contrast. Multiplanar CT image reconstructions of the cervical spine and maxillofacial structures were also generated. RADIATION DOSE REDUCTION: This exam was performed according to the departmental dose-optimization program which includes automated exposure control, adjustment of the mA and/or kV according to patient size and/or use of iterative reconstruction technique. COMPARISON:  None Available. FINDINGS: CT HEAD FINDINGS Brain: Normal anatomic configuration. Parenchymal volume loss is commensurate with the patient's age. Mild subcortical and periventricular white matter changes are present likely reflecting the sequela of small vessel ischemia. No abnormal intra or extra-axial mass lesion or fluid collection. No abnormal mass effect or midline shift. No evidence of acute intracranial hemorrhage or infarct. Ventricular size is normal. Cerebellum unremarkable. Vascular: No asymmetric hyperdense vasculature at the skull base. Skull: Intact Other:  Mastoid air cells and middle ear cavities are clear. CT MAXILLOFACIAL FINDINGS Osseous: Imaging is slightly limited by motion artifact. No fracture or mandibular dislocation. No destructive process. Orbits: Negative. No traumatic or inflammatory finding. Sinuses: Small air-fluid level within the right maxillary sinus is nonspecific. The remaining paranasal sinuses are clear. Soft tissues: Moderate right preseptal soft tissue swelling. Mild soft tissue swelling of the subcutaneous fat of the right buccal soft tissues. CT CERVICAL SPINE FINDINGS Alignment: Imaging is slightly limited by motion artifact. Normal alignment. No listhesis. Skull base and vertebrae: Craniocervical alignment is normal. The atlantodental interval is not widened. No acute fracture of the cervical spine. Anterior cervical discectomy and fusion with instrumentation of C5-6 has been performed with solid incorporation of interbody bone graft. Soft tissues and spinal canal: Dorsal column stimulator leads are seen with their terminal pads extending from the level of the foramen magnum posterior to the C2 and C3 vertebral bodies. Streak artifact related to the pass slightly limits evaluation of the spinal canal in this region. There is moderate central canal stenosis at C2-3 secondary to osteophytic ridging in combination with the device. No canal hematoma. No prevertebral soft tissue swelling or paraspinal fluid collections identified. Disc levels: There is intervertebral disc space narrowing and endplate remodeling at Y4-0 in keeping with changes of moderate degenerative disc disease. Remaining intervertebral disc heights are preserved. Prevertebral soft tissues are not thickened on sagittal reformats. Review of the axial images demonstrates moderate right neuroforaminal narrowing at C2-3 secondary to uncovertebral and facet arthrosis, mild bilateral neuroforaminal narrowing at C3-4 secondary to facet arthrosis, mild bilateral neuroforaminal  narrowing at C6-7 secondary to uncovertebral arthrosis. Upper chest: Unremarkable Other: None IMPRESSION: No acute intracranial injury.  No calvarial fracture. Moderate right preseptal and mild right facial soft tissue swelling. No facial fracture or mandibular dislocation. Small air-fluid level within the right maxillary sinus, nonspecific. No acute fracture or listhesis of the cervical spine. Electronically Signed   By: Fidela Salisbury M.D.   On: 12/27/2021 19:56   DG Hand Complete Left  Result Date: 12/27/2021 CLINICAL DATA:  Open little finger fracture.  Fall. EXAM: LEFT HAND - COMPLETE 3+ VIEW COMPARISON:  Radiograph 01/29/2019 FINDINGS: There is dorsal dislocation and ulnar subluxation of the fifth digit at the proximal interphalangeal joint. The middle phalanx is perched on the proximal phalanx. There is air in the adjacent soft tissues. No definite fracture is seen. No additional acute abnormality of the hand. Mild multifocal osteoarthritis. IMPRESSION: Dorsal dislocation and ulnar subluxation of the fifth digit  at the proximal interphalangeal joint. No definite fracture. Air in the adjacent soft tissues may indicate breech of the skin/open injury. Electronically Signed   By: Keith Rake M.D.   On: 12/27/2021 19:27   CT Maxillofacial WO CM  Result Date: 12/27/2021 CLINICAL DATA:  Head trauma, moderate-severe; Neck trauma (Age >= 65y); Facial trauma, blunt. Fall from standing, head injury, loss of consciousness. EXAM: CT HEAD WITHOUT CONTRAST CT MAXILLOFACIAL WITHOUT CONTRAST CT CERVICAL SPINE WITHOUT CONTRAST TECHNIQUE: Multidetector CT imaging of the head, cervical spine, and maxillofacial structures were performed using the standard protocol without intravenous contrast. Multiplanar CT image reconstructions of the cervical spine and maxillofacial structures were also generated. RADIATION DOSE REDUCTION: This exam was performed according to the departmental dose-optimization program which  includes automated exposure control, adjustment of the mA and/or kV according to patient size and/or use of iterative reconstruction technique. COMPARISON:  None Available. FINDINGS: CT HEAD FINDINGS Brain: Normal anatomic configuration. Parenchymal volume loss is commensurate with the patient's age. Mild subcortical and periventricular white matter changes are present likely reflecting the sequela of small vessel ischemia. No abnormal intra or extra-axial mass lesion or fluid collection. No abnormal mass effect or midline shift. No evidence of acute intracranial hemorrhage or infarct. Ventricular size is normal. Cerebellum unremarkable. Vascular: No asymmetric hyperdense vasculature at the skull base. Skull: Intact Other: Mastoid air cells and middle ear cavities are clear. CT MAXILLOFACIAL FINDINGS Osseous: Imaging is slightly limited by motion artifact. No fracture or mandibular dislocation. No destructive process. Orbits: Negative. No traumatic or inflammatory finding. Sinuses: Small air-fluid level within the right maxillary sinus is nonspecific. The remaining paranasal sinuses are clear. Soft tissues: Moderate right preseptal soft tissue swelling. Mild soft tissue swelling of the subcutaneous fat of the right buccal soft tissues. CT CERVICAL SPINE FINDINGS Alignment: Imaging is slightly limited by motion artifact. Normal alignment. No listhesis. Skull base and vertebrae: Craniocervical alignment is normal. The atlantodental interval is not widened. No acute fracture of the cervical spine. Anterior cervical discectomy and fusion with instrumentation of C5-6 has been performed with solid incorporation of interbody bone graft. Soft tissues and spinal canal: Dorsal column stimulator leads are seen with their terminal pads extending from the level of the foramen magnum posterior to the C2 and C3 vertebral bodies. Streak artifact related to the pass slightly limits evaluation of the spinal canal in this region.  There is moderate central canal stenosis at C2-3 secondary to osteophytic ridging in combination with the device. No canal hematoma. No prevertebral soft tissue swelling or paraspinal fluid collections identified. Disc levels: There is intervertebral disc space narrowing and endplate remodeling at K1-6 in keeping with changes of moderate degenerative disc disease. Remaining intervertebral disc heights are preserved. Prevertebral soft tissues are not thickened on sagittal reformats. Review of the axial images demonstrates moderate right neuroforaminal narrowing at C2-3 secondary to uncovertebral and facet arthrosis, mild bilateral neuroforaminal narrowing at C3-4 secondary to facet arthrosis, mild bilateral neuroforaminal narrowing at C6-7 secondary to uncovertebral arthrosis. Upper chest: Unremarkable Other: None IMPRESSION: No acute intracranial injury.  No calvarial fracture. Moderate right preseptal and mild right facial soft tissue swelling. No facial fracture or mandibular dislocation. Small air-fluid level within the right maxillary sinus, nonspecific. No acute fracture or listhesis of the cervical spine. Electronically Signed   By: Fidela Salisbury M.D.   On: 12/27/2021 19:56    Procedures Procedures   Medications Ordered in ED Medications  vancomycin (VANCOREADY) IVPB 1250 mg/250 mL (has no administration in  time range)  fentaNYL (SUBLIMAZE) injection 50 mcg (50 mcg Intravenous Given 12/27/21 1903)  Tdap (BOOSTRIX) injection 0.5 mL (0.5 mLs Intramuscular Given 12/27/21 1904)  vancomycin (VANCOREADY) IVPB 1500 mg/300 mL (1,500 mg Intravenous New Bag/Given 12/27/21 1932)  lidocaine (PF) (XYLOCAINE) 1 % injection 10 mL (10 mLs Infiltration Given by Other 12/27/21 2055)  fentaNYL (SUBLIMAZE) injection 50 mcg (50 mcg Intravenous Given 12/27/21 2118)  lidocaine (PF) (XYLOCAINE) 1 % injection (30 mLs  Given by Other 12/27/21 2119)   ED Course/ Medical Decision Making/ A&P                           Medical  Decision Making Amount and/or Complexity of Data Reviewed Labs: ordered. Radiology: ordered.  Risk Prescription drug management. Decision regarding hospitalization.  This patient presents to the ED for concern of syncope, this involves an extensive number of treatment options, and is a complaint that carries with it a high risk of complications and morbidity.  The differential diagnosis includes dysrhythmia, MI, vasovagal orthostatic, stroke, hypoglycemia, seizure, etc.   Co morbidities that complicate the patient evaluation Hyperlipidemia, migraines  Additional history obtained:  Additional history obtained from: Sister and daughter at bedside External records from outside source obtained and reviewed including: Most recent family medicine physician note  EKG: EKG: normal EKG, normal sinus rhythm.   Cardiac Monitoring: The patient was maintained on a cardiac monitor.  I personally viewed and interpreted the cardiac monitored which showed an underlying rhythm of: Sinus rhythm  Lab Results: I personally ordered, reviewed, and interpreted labs. Pertinent results include: BMP with slightly elevated creatinine, glucose 69 CBC with hemoglobin 11.7, decreased from most recent which was 2 years ago UA negative  Imaging Studies ordered:  I ordered imaging studies which included x-ray and CT.  I independently reviewed & interpreted imaging & am in agreement with radiology impression. Imaging shows: CT head, C-spine, maxillofacial are all negative for acute injuries Initial imaging of the left hand shows a dislocated and displaced left little finger injury.  This is an open dislocation. Postreduction films show left little finger reduced  Medications  I ordered medication including vancomycin for open dislocation, fentanyl for pain, tetanus for open injury Reevaluation of the patient after medication shows that patient improved -I reviewed the patient's home medications and did not  make adjustments. -I did not prescribe new home medications.  Tests Considered: Could consider troponin, however the patient does not have any chest pain.  Had no palpitations or other cardiac symptoms.  Critical Interventions: Reduction of left little finger  Consultations: I requested consultation with the hand surgeon, Dr. Grandville Silos @ 2103,  and discussed lab and imaging findings as well as pertinent plan - they recommend: will come evaluate the patient. Dr. Marlyce Huge @ 2303, hospitalist to admit   SDH None identified at this time  ED Course: 66 year old female who presents to the emergency department with dizziness and syncope.  Physical exam notable for hematoma to the right eye.  She also has an open dislocation of the left little finger.  She has an abrasion to the right elbow. Obtain basic labs and imaging of her head and C-spine.  These are both negative for acute injuries such as hemorrhage or fractures. Imaging of the left little finger does show a dislocation at the PIP. Started her on IV vancomycin. She has anaphylaxis to Penicillin and no hx of receiving cephalosporins. Tetanus updated. 2145Grandville Silos, MD at bedside to reduce  and close open finger dislocation. If she is admitted, he will start her on ongoing IV antibiotics. He will also see her in office next week.  Postreduction films appear adequate.  The patient's finger was also splinted.  Her labs are relatively unremarkable.  There is no electrolyte abnormality to contribute to her syncope.  No UTI.  She is not symptomatically anemic and no evidence of bleeding.  She is not hypotensive here or tachycardic.  She is not having chest pain.  She has a nonfocal neuro exam.  Could consider PE as a component of her syncope, however she is not having chest pain or shortness of breath.  She is not hypoxic or tachycardic or tachypneic.  No history of clotting disorders. Presentation not consistent with dissection or ACS.   Unclear  etiology of her syncope. I do think she is higher risk given her age, comorbidities, and really lack of prodrome prior to her syncope. Feel that admission for observation overnight would be safest course for patient. She is in agreement with this plan. Spoke with Dr. Marlyce Huge, MD hospitalist @ 2303 who will admit patient.   After consideration of the diagnostic results and the patients response to treatment, I feel that the patent would benefit from admission.. The patient has been appropriately medically screened and/or stabilized in the ED.   Final Clinical Impression(s) / ED Diagnoses Final diagnoses:  Fall, initial encounter  Dizziness  Dislocation of finger, initial encounter    Rx / DC Orders ED Discharge Orders     None         Mickie Hillier, PA-C 12/27/21 2303    Sherwood Gambler, MD 12/28/21 1623

## 2021-12-27 NOTE — ED Triage Notes (Signed)
Via EMS> pt reports she was standing and then felt dizzy had + LOC and hit head on cement. Denies blood thinners. Pt also has wound to L hand. All bleeding controlled. GCS 15

## 2021-12-27 NOTE — ED Triage Notes (Signed)
EMS gave 25mg of fentanyl PTA

## 2021-12-27 NOTE — Progress Notes (Signed)
Pharmacy Antibiotic Note  Julie Neal is a 66 y.o. female for which pharmacy has been consulted for vancomycin dosing for  Open fracture; Type 1 PCN allergy; IV clindamycin shortage .  Patient with a history of HLD, migraines. Patient presenting with LOC and fall.  SCr 1.13  Plan: Vancomycin 1500 mg once then 1250 mg q24hr (eAUC 544) unless change in renal function Trend WBC, Fever, Renal function, & Clinical course F/u cultures, clinical course, WBC, fever De-escalate when able F/u plan for OR  Height: '5\' 7"'$  (170.2 cm) Weight: 72.6 kg (160 lb) IBW/kg (Calculated) : 61.6  Temp (24hrs), Avg:98.5 F (36.9 C), Min:98.5 F (36.9 C), Max:98.5 F (36.9 C)  No results for input(s): WBC, CREATININE, LATICACIDVEN, VANCOTROUGH, VANCOPEAK, VANCORANDOM, GENTTROUGH, GENTPEAK, GENTRANDOM, TOBRATROUGH, TOBRAPEAK, TOBRARND, AMIKACINPEAK, AMIKACINTROU, AMIKACIN in the last 168 hours.  CrCl cannot be calculated (Patient's most recent lab result is older than the maximum 21 days allowed.).    Allergies  Allergen Reactions   Mobic [Meloxicam] Anaphylaxis   Penicillins Anaphylaxis   Nubain [Nalbuphine Hcl] Diarrhea and Nausea And Vomiting    Antimicrobials this admission: vancomycin 5/19 >>  Microbiology results: Pending  Thank you for allowing pharmacy to be a part of this patient's care.  Lorelei Pont, PharmD, BCPS 12/27/2021 6:38 PM ED Clinical Pharmacist -  [phone]

## 2021-12-27 NOTE — ED Notes (Signed)
Patient hands and face cleaned of dried blood and wound to left fifth digit cleaned per provider request. Patient is alert and oriented and calm. Patient requests pain medication. Provider aware.

## 2021-12-28 ENCOUNTER — Encounter (HOSPITAL_COMMUNITY): Payer: Self-pay | Admitting: Internal Medicine

## 2021-12-28 ENCOUNTER — Observation Stay (HOSPITAL_COMMUNITY): Payer: Medicare Other

## 2021-12-28 DIAGNOSIS — Z0389 Encounter for observation for other suspected diseases and conditions ruled out: Secondary | ICD-10-CM | POA: Diagnosis not present

## 2021-12-28 DIAGNOSIS — K148 Other diseases of tongue: Secondary | ICD-10-CM | POA: Diagnosis not present

## 2021-12-28 DIAGNOSIS — Z8249 Family history of ischemic heart disease and other diseases of the circulatory system: Secondary | ICD-10-CM | POA: Diagnosis not present

## 2021-12-28 DIAGNOSIS — F329 Major depressive disorder, single episode, unspecified: Secondary | ICD-10-CM | POA: Diagnosis not present

## 2021-12-28 DIAGNOSIS — S61207A Unspecified open wound of left little finger without damage to nail, initial encounter: Secondary | ICD-10-CM | POA: Diagnosis not present

## 2021-12-28 DIAGNOSIS — Z880 Allergy status to penicillin: Secondary | ICD-10-CM | POA: Diagnosis not present

## 2021-12-28 DIAGNOSIS — E782 Mixed hyperlipidemia: Secondary | ICD-10-CM | POA: Diagnosis not present

## 2021-12-28 DIAGNOSIS — S63287A Dislocation of proximal interphalangeal joint of left little finger, initial encounter: Secondary | ICD-10-CM | POA: Diagnosis not present

## 2021-12-28 DIAGNOSIS — Z888 Allergy status to other drugs, medicaments and biological substances status: Secondary | ICD-10-CM | POA: Diagnosis not present

## 2021-12-28 DIAGNOSIS — W1789XA Other fall from one level to another, initial encounter: Secondary | ICD-10-CM | POA: Diagnosis present

## 2021-12-28 DIAGNOSIS — F419 Anxiety disorder, unspecified: Secondary | ICD-10-CM | POA: Diagnosis present

## 2021-12-28 DIAGNOSIS — Z79899 Other long term (current) drug therapy: Secondary | ICD-10-CM | POA: Diagnosis not present

## 2021-12-28 DIAGNOSIS — M797 Fibromyalgia: Secondary | ICD-10-CM | POA: Diagnosis present

## 2021-12-28 DIAGNOSIS — R509 Fever, unspecified: Secondary | ICD-10-CM | POA: Diagnosis not present

## 2021-12-28 DIAGNOSIS — R296 Repeated falls: Secondary | ICD-10-CM | POA: Diagnosis present

## 2021-12-28 DIAGNOSIS — Z23 Encounter for immunization: Secondary | ICD-10-CM | POA: Diagnosis not present

## 2021-12-28 DIAGNOSIS — Z87891 Personal history of nicotine dependence: Secondary | ICD-10-CM | POA: Diagnosis not present

## 2021-12-28 DIAGNOSIS — Z20822 Contact with and (suspected) exposure to covid-19: Secondary | ICD-10-CM | POA: Diagnosis present

## 2021-12-28 DIAGNOSIS — R55 Syncope and collapse: Secondary | ICD-10-CM

## 2021-12-28 DIAGNOSIS — Z820 Family history of epilepsy and other diseases of the nervous system: Secondary | ICD-10-CM | POA: Diagnosis not present

## 2021-12-28 DIAGNOSIS — G43909 Migraine, unspecified, not intractable, without status migrainosus: Secondary | ICD-10-CM | POA: Diagnosis present

## 2021-12-28 DIAGNOSIS — R42 Dizziness and giddiness: Secondary | ICD-10-CM | POA: Diagnosis present

## 2021-12-28 DIAGNOSIS — Z833 Family history of diabetes mellitus: Secondary | ICD-10-CM | POA: Diagnosis not present

## 2021-12-28 DIAGNOSIS — Y92009 Unspecified place in unspecified non-institutional (private) residence as the place of occurrence of the external cause: Secondary | ICD-10-CM | POA: Diagnosis not present

## 2021-12-28 DIAGNOSIS — F32A Depression, unspecified: Secondary | ICD-10-CM | POA: Diagnosis present

## 2021-12-28 DIAGNOSIS — Z823 Family history of stroke: Secondary | ICD-10-CM | POA: Diagnosis not present

## 2021-12-28 LAB — CBC WITH DIFFERENTIAL/PLATELET
Abs Immature Granulocytes: 0.03 10*3/uL (ref 0.00–0.07)
Basophils Absolute: 0 10*3/uL (ref 0.0–0.1)
Basophils Relative: 0 %
Eosinophils Absolute: 0.1 10*3/uL (ref 0.0–0.5)
Eosinophils Relative: 1 %
HCT: 37.5 % (ref 36.0–46.0)
Hemoglobin: 12.6 g/dL (ref 12.0–15.0)
Immature Granulocytes: 0 %
Lymphocytes Relative: 15 %
Lymphs Abs: 1.4 10*3/uL (ref 0.7–4.0)
MCH: 30.4 pg (ref 26.0–34.0)
MCHC: 33.6 g/dL (ref 30.0–36.0)
MCV: 90.6 fL (ref 80.0–100.0)
Monocytes Absolute: 0.8 10*3/uL (ref 0.1–1.0)
Monocytes Relative: 9 %
Neutro Abs: 7 10*3/uL (ref 1.7–7.7)
Neutrophils Relative %: 75 %
Platelets: 182 10*3/uL (ref 150–400)
RBC: 4.14 MIL/uL (ref 3.87–5.11)
RDW: 12.1 % (ref 11.5–15.5)
WBC: 9.3 10*3/uL (ref 4.0–10.5)
nRBC: 0 % (ref 0.0–0.2)

## 2021-12-28 LAB — ECHOCARDIOGRAM COMPLETE
AR max vel: 2.23 cm2
AV Peak grad: 12.7 mmHg
Ao pk vel: 1.78 m/s
Area-P 1/2: 4.89 cm2
Height: 67 in
P 1/2 time: 453 msec
S' Lateral: 2.1 cm
Weight: 2692.8 oz

## 2021-12-28 LAB — COMPREHENSIVE METABOLIC PANEL
ALT: 31 U/L (ref 0–44)
AST: 43 U/L — ABNORMAL HIGH (ref 15–41)
Albumin: 3.8 g/dL (ref 3.5–5.0)
Alkaline Phosphatase: 43 U/L (ref 38–126)
Anion gap: 9 (ref 5–15)
BUN: 19 mg/dL (ref 8–23)
CO2: 26 mmol/L (ref 22–32)
Calcium: 9.6 mg/dL (ref 8.9–10.3)
Chloride: 103 mmol/L (ref 98–111)
Creatinine, Ser: 1.2 mg/dL — ABNORMAL HIGH (ref 0.44–1.00)
GFR, Estimated: 50 mL/min — ABNORMAL LOW (ref >60)
Glucose, Bld: 102 mg/dL — ABNORMAL HIGH (ref 70–99)
Potassium: 3.9 mmol/L (ref 3.5–5.1)
Sodium: 138 mmol/L (ref 135–145)
Total Bilirubin: 0.8 mg/dL (ref 0.3–1.2)
Total Protein: 7.2 g/dL (ref 6.5–8.1)

## 2021-12-28 LAB — URINALYSIS, ROUTINE W REFLEX MICROSCOPIC
Bacteria, UA: NONE SEEN
Bilirubin Urine: NEGATIVE
Glucose, UA: NEGATIVE mg/dL
Ketones, ur: NEGATIVE mg/dL
Leukocytes,Ua: NEGATIVE
Nitrite: NEGATIVE
Protein, ur: NEGATIVE mg/dL
Specific Gravity, Urine: 1.019 (ref 1.005–1.030)
pH: 7 (ref 5.0–8.0)

## 2021-12-28 LAB — C-REACTIVE PROTEIN: CRP: 0.8 mg/dL (ref <1.0)

## 2021-12-28 LAB — MAGNESIUM: Magnesium: 1.8 mg/dL (ref 1.7–2.4)

## 2021-12-28 LAB — TROPONIN I (HIGH SENSITIVITY): Troponin I (High Sensitivity): 20 ng/L — ABNORMAL HIGH (ref <18)

## 2021-12-28 LAB — HIV ANTIBODY (ROUTINE TESTING W REFLEX): HIV Screen 4th Generation wRfx: NONREACTIVE

## 2021-12-28 LAB — RESP PANEL BY RT-PCR (FLU A&B, COVID) ARPGX2
Influenza A by PCR: NEGATIVE
Influenza B by PCR: NEGATIVE
SARS Coronavirus 2 by RT PCR: NEGATIVE

## 2021-12-28 MED ORDER — ACETAMINOPHEN 650 MG RE SUPP: 650.0000 mg | Freq: Four times a day (QID) | RECTAL | Status: DC | PRN

## 2021-12-28 MED ORDER — OXYCODONE-ACETAMINOPHEN 5-325 MG PO TABS: 1.0000 | ORAL_TABLET | Freq: Four times a day (QID) | ORAL | Status: DC | PRN

## 2021-12-28 MED ORDER — TRAZODONE HCL 100 MG PO TABS
100.0000 mg | ORAL_TABLET | Freq: Every day | ORAL | Status: DC
  Administered 2021-12-28: 100 mg via ORAL
  Filled 2021-12-28: qty 1

## 2021-12-28 MED ORDER — TIZANIDINE HCL 4 MG PO TABS: 4.0000 mg | ORAL_TABLET | Freq: Three times a day (TID) | ORAL | Status: DC | PRN

## 2021-12-28 MED ORDER — OXYCODONE-ACETAMINOPHEN 5-325 MG PO TABS
2.0000 | ORAL_TABLET | Freq: Four times a day (QID) | ORAL | Status: DC | PRN
  Administered 2021-12-28 – 2021-12-29 (×5): 2 via ORAL
  Filled 2021-12-28 (×5): qty 2

## 2021-12-28 MED ORDER — BUPROPION HCL ER (XL) 150 MG PO TB24
300.0000 mg | ORAL_TABLET | Freq: Every day | ORAL | Status: DC
  Administered 2021-12-28: 300 mg via ORAL
  Filled 2021-12-28: qty 2

## 2021-12-28 MED ORDER — LACTATED RINGERS IV SOLN: INTRAVENOUS | Status: AC

## 2021-12-28 MED ORDER — POLYETHYLENE GLYCOL 3350 17 G PO PACK: 17.0000 g | PACK | Freq: Every day | ORAL | Status: DC | PRN

## 2021-12-28 MED ORDER — DULOXETINE HCL 60 MG PO CPEP
60.0000 mg | ORAL_CAPSULE | Freq: Every day | ORAL | Status: DC
  Administered 2021-12-28 – 2021-12-29 (×2): 60 mg via ORAL
  Filled 2021-12-28 (×3): qty 1

## 2021-12-28 MED ORDER — ATORVASTATIN CALCIUM 40 MG PO TABS
40.0000 mg | ORAL_TABLET | Freq: Every day | ORAL | Status: DC
  Administered 2021-12-28 – 2021-12-29 (×2): 40 mg via ORAL
  Filled 2021-12-28 (×2): qty 1

## 2021-12-28 MED ORDER — DIAZEPAM 5 MG PO TABS
5.0000 mg | ORAL_TABLET | Freq: Two times a day (BID) | ORAL | Status: DC | PRN
  Administered 2021-12-28: 5 mg via ORAL
  Filled 2021-12-28: qty 1

## 2021-12-28 MED ORDER — ONDANSETRON HCL 4 MG PO TABS: 4.0000 mg | ORAL_TABLET | Freq: Four times a day (QID) | ORAL | Status: DC | PRN

## 2021-12-28 MED ORDER — HYDROMORPHONE HCL 1 MG/ML IJ SOLN
0.5000 mg | INTRAMUSCULAR | Status: AC
  Administered 2021-12-28: 0.5 mg via INTRAVENOUS
  Filled 2021-12-28: qty 1

## 2021-12-28 MED ORDER — ENOXAPARIN SODIUM 40 MG/0.4ML IJ SOSY
40.0000 mg | PREFILLED_SYRINGE | Freq: Every day | INTRAMUSCULAR | Status: DC
  Administered 2021-12-28: 40 mg via SUBCUTANEOUS
  Filled 2021-12-28 (×2): qty 0.4

## 2021-12-28 MED ORDER — ONDANSETRON HCL 4 MG/2ML IJ SOLN: 4.0000 mg | Freq: Four times a day (QID) | INTRAMUSCULAR | Status: DC | PRN

## 2021-12-28 MED ORDER — ACETAMINOPHEN 325 MG PO TABS
650.0000 mg | ORAL_TABLET | Freq: Four times a day (QID) | ORAL | Status: DC | PRN
Start: 2021-12-28 — End: 2021-12-29

## 2021-12-28 NOTE — Progress Notes (Signed)
This is a pleasant 66 year old lady with history of migraine headache, depression, fibromyalgia and couple other comorbidities who presented to hospital with loss of consciousness as well as fall and was diagnosed to have open dislocation of proximal interphalangeal joint of the left little finger in the ED.  She was admitted for further work-up for loss of consciousness and fall and weakness.  She was seen by hand surgeon in the ED and underwent successful closed reduction of the finger.  She had wound at the site of fracture.  She also had fever 101 in the ED.  Currently she has dressing in the left finger.  She has remained afebrile.  She is on vancomycin.  On examination, she appears to have lipsmacking movement consistent with tardive dyskinesia.  Upon review of her medication, it appears that she is on bupropion which can be the culprit for this.  I have discussed at length with the patient and with her agreement, I am going to discontinue this medication.  We will resume Valium as benzodiazepines help with improvement of the tardive dyskinesia.  Hand surgery also recommended 24 hours of IV antibiotics.  We will reassess patient tomorrow.  She is already seen by PT OT and they recommend home health PT OT.  Patient is asymptomatic at the moment.

## 2021-12-28 NOTE — Procedures (Addendum)
History: 66 year old female being evaluated for transient loss of consciousness  Sedation: None  Technique: This EEG was acquired with electrodes placed according to the International 10-20 electrode system (including Fp1, Fp2, F3, F4, C3, C4, P3, P4, O1, O2, T3, T4, T5, T6, A1, A2, Fz, Cz, Pz). The following electrodes were missing or displaced: none.   Background: The background consists of intermixed alpha and beta activities. There is a well defined posterior dominant rhythm of nine hz that attenuates with eye opening.  There is significant movement artifact, both eye movement as well as head movement throughout the study, but no epileptiform activity was seen.   Photic stimulation: Physiologic driving is now performed  EEG Abnormalities: None  Clinical Interpretation: This EEG was normal in the waking and drowsy state. There was no seizure or seizure predisposition recorded on this study. Please note that lack of epileptiform activity on EEG does not preclude the possibility of epilepsy.   Roland Rack, MD [facility] [phone]  If 7pm- 7am, please page neurology on call as listed in [place].

## 2021-12-28 NOTE — Progress Notes (Signed)
EEG complete - results pending 

## 2021-12-28 NOTE — Assessment & Plan Note (Signed)
   Continue home regimen of psychotropic medications 

## 2021-12-28 NOTE — Evaluation (Signed)
Physical Therapy Evaluation Patient Details Name: Julie Neal MRN: [id number] DOB: December 03, 1955 Today's Date: 12/28/2021  History of Present Illness  66 year old female who presented to the hospital on 5/19 s/p fall with +LOC and open dislocation of the left fifth digit. Pt with PMH migraine headaches, depression, fibromyalgia.  Clinical Impression  Patient presents with generalized weakness, pain, decreased balance and impaired mobility s/p above. Pt lives at home with her sister and uses rollator for ambulation; endorses limited walking at baseline and has hx of falls. Noted to have decreased safety awareness today. Tolerated transfers and gait training with Min guard-Min A for safety and use of RW for support. Might do better with rollator since that is what she uses at home. Bedroom is on second level so needs to be able to negotiate stairs prior to d/c. Due to impaired balance, hx of falls and generalized soreness/weakness from fall, recommend HHPT services for overall strengthening, stairs and safety evaluation. Will follow acutely to maximize independence and mobility prior to return home.       Recommendations for follow up therapy are one component of a multi-disciplinary discharge planning process, led by the attending physician.  Recommendations may be updated based on patient status, additional functional criteria and insurance authorization.  Follow Up Recommendations Home health PT    Assistance Recommended at Discharge Intermittent Supervision/Assistance  Patient can return home with the following  A little help with walking and/or transfers;Assistance with cooking/housework;Assist for transportation;Help with stairs or ramp for entrance    Equipment Recommendations None recommended by PT  Recommendations for Other Services       Functional Status Assessment Patient has had a recent decline in their functional status and demonstrates the ability to make significant  improvements in function in a reasonable and predictable amount of time.     Precautions / Restrictions Precautions Precautions: Fall Restrictions Weight Bearing Restrictions: No Other Position/Activity Restrictions: no orders placed for L 5th digit, maintained NWB through that finger      Mobility  Bed Mobility Overal bed mobility: Needs Assistance Bed Mobility: Sit to Supine       Sit to supine: Supervision, HOB elevated   General bed mobility comments: No assist needed to return to supine.    Transfers Overall transfer level: Needs assistance Equipment used: Rolling walker (2 wheels) Transfers: Sit to/from Stand Sit to Stand: Supervision           General transfer comment: Supervision for safety. Stood from chair 1, does well with UE support.    Ambulation/Gait Ambulation/Gait assistance: Min assist, Min guard Gait Distance (Feet): 80 Feet Assistive device: Rolling walker (2 wheels) Gait Pattern/deviations: Step-through pattern, Decreased stride length, Drifts right/left Gait velocity: decreased     General Gait Details: SLow, mildly unsteady gait with cues for RW management. Difficulty with turns as pt reports she uses rollator at home.  Stairs            Wheelchair Mobility    Modified Rankin (Stroke Patients Only)       Balance Overall balance assessment: Needs assistance Sitting-balance support: Feet supported, No upper extremity supported Sitting balance-Leahy Scale: Good     Standing balance support: During functional activity, Reliant on assistive device for balance Standing balance-Leahy Scale: Poor Standing balance comment: Requires UE support                             Pertinent Vitals/Pain Pain Assessment Pain Assessment: Faces  Faces Pain Scale: Hurts even more Pain Location: Lft pinky, ribs, face Pain Descriptors / Indicators: Discomfort, Throbbing Pain Intervention(s): Monitored during session, Repositioned,  Limited activity within patient's tolerance    Home Living Family/patient expects to be discharged to:: Private residence Living Arrangements: Other relatives (sister and brother in [place]) Available Help at Discharge: Available 24 hours/day;Family Type of Home: House Home Access: Level entry     Alternate Level Stairs-Number of Steps: flight Home Layout: Two level Home Equipment: Rollator (4 wheels);Cane - single point;Grab bars - tub/shower      Prior Function Prior Level of Function : Independent/Modified Independent;History of Falls (last six months)             Mobility Comments: rollator for mobility, reports 3 falls recently ADLs Comments: indep, manages meds, 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 Dominance   Dominant Hand: Right    Extremity/Trunk Assessment   Upper Extremity Assessment Upper Extremity Assessment: Defer to OT evaluation LUE Deficits / Details: 5th digit wrapped, maintained NWB LUE Sensation: WNL LUE Coordination: decreased fine motor    Lower Extremity Assessment Lower Extremity Assessment: Generalized weakness (but functional)    Cervical / Trunk Assessment Cervical / Trunk Assessment: Normal  Communication   Communication: No difficulties  Cognition Arousal/Alertness: Awake/alert Behavior During Therapy: WFL for tasks assessed/performed Overall Cognitive Status: No family/caregiver present to determine baseline cognitive functioning Area of Impairment: Attention, Memory, Following commands, Safety/judgement, Awareness, Problem solving                   Current Attention Level: Sustained Memory: Decreased short-term memory Following Commands: Follows one step commands consistently Safety/Judgement: Decreased awareness of safety, Decreased awareness of deficits Awareness: Emergent Problem Solving: Slow processing, Requires verbal cues General Comments: restless full body movements throughout session, pt states this has been going on  "for years." Poor attention, required cues for safety.        General Comments General comments (skin integrity, edema, etc.): VSS on RA.    Exercises     Assessment/Plan    PT Assessment Patient needs continued PT services  PT Problem List Decreased strength;Decreased mobility;Decreased balance;Pain;Decreased knowledge of use of DME;Decreased cognition;Cardiopulmonary status limiting activity;Decreased safety awareness       PT Treatment Interventions Therapeutic exercise;DME instruction;Gait training;Stair training;Functional mobility training;Therapeutic activities;Patient/family education;Balance training    PT Goals (Current goals can be found in the Care Plan section)  Acute Rehab PT Goals Patient Stated Goal: decrease pain, go home PT Goal Formulation: With patient Time For Goal Achievement: 01/11/22 Potential to Achieve Goals: Good    Frequency Min 3X/week     Co-evaluation               AM-PAC PT "6 Clicks" Mobility  Outcome Measure Help needed turning from your back to your side while in a flat bed without using bedrails?: None Help needed moving from lying on your back to sitting on the side of a flat bed without using bedrails?: None Help needed moving to and from a bed to a chair (including a wheelchair)?: A Little Help needed standing up from a chair using your arms (e.g., wheelchair or bedside chair)?: A Little Help needed to walk in hospital room?: A Little Help needed climbing 3-5 steps with a railing? : A Little 6 Click Score: 20    End of Session Equipment Utilized During Treatment: Gait belt Activity Tolerance: Patient tolerated treatment well Patient left: in bed;with call bell/phone within reach;Other (comment) (  with ECHO tech present) Nurse Communication: Mobility status PT Visit Diagnosis: Unsteadiness on feet (R26.81);Muscle weakness (generalized) (M62.81);Difficulty in walking, not elsewhere classified (R26.2)    Time: 1660-6301 PT  Time Calculation (min) (ACUTE ONLY): 20 min   Charges:   PT Evaluation $PT Eval Moderate Complexity: 1 Mod          Marisa Severin, PT, DPT Acute Rehabilitation Services Secure chat preferred Office [phone]     Marguarite Arbour [place] 12/28/2021, 11:41 AM

## 2021-12-28 NOTE — Assessment & Plan Note (Signed)
   Patient suffered an open dislocation of the left fifth digit during her fall  Empiric intravenous antibiotics with vancomycin have been initiated by the emergency department  Patient has already been evaluated by Dr. Grandville Silos with hand surgery who has reduced the dislocation and recommends 24 hours of intravenous antibiotics followed by transitioning to a 7-day course of oral antibiotics with close outpatient follow-up with him  Dr. Grandville Silos states that his office will contact the patient on Monday to arrange follow-up appointment.  Additionally provided patient with.  Opiate-based analgesics for substantial associated pain

## 2021-12-28 NOTE — H&P (Addendum)
History and Physical    Patient: Julie Neal MRN: [id number] DOA: 12/27/2021  Date of Service: the patient was seen and examined on 12/28/2021  Patient coming from: Home via EMS  Chief Complaint:  Chief Complaint  Patient presents with   Loss of Consciousness   Fall    HPI:   66 year old female with past medical history of hyperlipidemia, migraine headaches, depression, fibromyalgia who presents to [facility] emergency department via EMS after experiencing an episode of loss of consciousness.  Patient explains that she was walking out to her mailbox to get her mail the afternoon of 5/19 when she suddenly began to experience lightheadedness.  Shortly thereafter she woke up on the ground.  Patient states that she immediately felt severe pain in her left fifth digit, severe in intensity, radiating proximally and sharp in quality.  As of late the patient has frequently felt dizzy and fallen several times.  Patient does report a similar episode of LOC 3 weeks ago but did not seek medical attention at the time.    Upon further questioning patient denies any recent symptoms of fever, nausea, vomiting, sick contacts shortness of breath, chest pain or palpitations.  After the fall patient denies biting her tongue and denies any self urination or defecation.  EMS was contacted who promptly came to evaluate the patient.  200 mcg of fentanyl were administered for pain related to her hand injury.  On arrival to [facility] emergency department patient was found to have an open dislocation of the left fifth digit.  Tdap was administered.  Intravenous vancomycin was initiated for prophylaxis considering the open dislocation.  ER provider discussed case with hand surgery and Dr. Grandville Silos came to evaluate the patient at the bedside and reduced the open dislocation followed by dressing the hand.  Patient was administered fentanyl and Dilaudid for pain control.  Due to the patient's syncope  and frequent falls as of late the hospitalist group was called to assess the patient for admission to the hospital for syncope work-up.  Review of Systems: Review of Systems  Musculoskeletal:        Left hand pain  Neurological:  Positive for dizziness and loss of consciousness.  All other systems reviewed and are negative.   Past Medical History:  Diagnosis Date   Anxiety    Asymptomatic stenosis of right carotid artery    1-30%   Cervicalgia    Depression    Fibromyalgia    Fracture of ankle, bimalleolar, left, closed 00/93/8182   Helicobacter pylori gastritis 10/2016   Hyperlipidemia    Left leg weakness    Lumbago    Memory loss    Migraines    Neuralgic migraines    Osteoporosis     Past Surgical History:  Procedure Laterality Date   ABDOMINAL HYSTERECTOMY     BACK SURGERY     HAND SURGERY Bilateral    LEG SURGERY Bilateral    NECK SURGERY     SHOULDER SURGERY Left     Social History:  reports that she has quit smoking. Her smoking use included cigarettes. She has never used smokeless tobacco. She reports that she does not drink alcohol and does not use drugs.  Allergies  Allergen Reactions   Mobic [Meloxicam] Anaphylaxis   Penicillins Anaphylaxis   Nubain [Nalbuphine Hcl] Diarrhea and Nausea And Vomiting    Family History  Problem Relation Age of Onset   Heart attack Father    Heart disease Father 101  Alzheimer's disease Mother    Stroke Mother    Diabetes type II Brother    Breast cancer Sister     Prior to Admission medications   Medication Sig Start Date End Date Taking? Authorizing Provider  atorvastatin (LIPITOR) 40 MG tablet Take 1 tablet (40 mg total) by mouth daily. 06/26/21  Yes Nche, Charlene Brooke, NP  clindamycin (CLEOCIN) 300 MG capsule Take 1 capsule (300 mg total) by mouth 3 (three) times daily for 7 days. 12/27/21 01/03/22 Yes Milly Jakob, MD  buPROPion (WELLBUTRIN XL) 300 MG 24 hr tablet Take 1 tablet (300 mg total) by mouth daily.  06/26/21   Nche, Charlene Brooke, NP  CALCIUM CITRATE PO Take by mouth 2 (two) times daily.    [provider]  Cholecalciferol (VITAMIN D3 PO) Take by mouth daily.    [provider]  diazepam (VALIUM) 5 MG tablet TAKE 1 TABLET BY MOUTH EVERY 12 HOURS AS NEEDED FOR MUSCLE SPASM (ESOPHAGEAL  SPASM) 11/26/21   Nche, Charlene Brooke, NP  DULoxetine (CYMBALTA) 60 MG capsule Take 1 capsule (60 mg total) by mouth daily. 06/26/21   Nche, Charlene Brooke, NP  minocycline (MINOCIN) 50 MG capsule Take 1 capsule by mouth twice daily 07/11/21   Lavonna Monarch, MD  mupirocin ointment (BACTROBAN) 2 % Apply 1 application topically 2 (two) times daily. x7day 12/24/20   NcheCharlene Brooke, NP  naloxone [facility]) nasal spray 4 mg/0.1 mL SMARTSIG:Spray(s) In Nostril 02/09/21   [provider]  oxyCODONE-acetaminophen (PERCOCET) 10-325 MG tablet Take 1 tablet by mouth every 6 (six) hours as needed. 02/09/21   [provider]  promethazine (PHENERGAN) 25 MG tablet Take 1 tablet (25 mg total) by mouth every 8 (eight) hours as needed for nausea or vomiting. 11/22/21   Nche, Charlene Brooke, NP  SUMAtriptan (IMITREX) 50 MG tablet Take 1 tablet (50 mg total) by mouth once as needed for up to 1 dose for migraine (may repeat x 1 tab in 1-2hrs if headache persists). May repeat in 2 hours if headache persists or recurs. 04/02/21   Nche, Charlene Brooke, NP  tiZANidine (ZANAFLEX) 4 MG tablet TAKE 1 TABLET BY MOUTH 4 TIMES A DAY AS NEEDED 02/17/17   [provider]  traZODone (DESYREL) 100 MG tablet TAKE 2 TABLETS BY MOUTH IN THE EVENING AT BEDTIME 09/16/21   Flossie Buffy, NP    Physical Exam:  Vitals:   12/27/21 2345 12/27/21 2351 12/28/21 0015 12/28/21 0433  BP: (!) 142/79 (!) 146/83 (!) 149/84 (!) 133/91  Pulse:  96  (!) 113  Resp:  '18 18 18  '$ Temp:  98.2 F (36.8 C) (!) 101 F (38.3 C) 99 F (37.2 C)  TempSrc:  Oral Oral Oral  SpO2:  98% 93% 92%  Weight:   76.3 kg   Height:   '5\' 7"'$   (1.702 m)     Constitutional: Awake alert and oriented x3, no associated distress.   Skin: Notable periorbital ecchymosis of the right eye.  Left 5th digit dressing C/D/I.  Poor skin turgor noted. Eyes: Pupils are equally reactive to light.  No evidence of scleral icterus or conjunctival pallor.  ENMT: Moist mucous membranes noted.  Posterior pharynx clear of any exudate or lesions.   Neck: normal, supple, no masses, no thyromegaly.  No evidence of jugular venous distension.   Respiratory: clear to auscultation bilaterally, no wheezing, no crackles. Normal respiratory effort. No accessory muscle use.  Cardiovascular: Regular rate and rhythm, no murmurs / rubs /  gallops. No extremity edema. 2+ pedal pulses. No carotid bruits.  Chest:   Nontender without crepitus or deformity.   Back:   Nontender without crepitus or deformity. Abdomen: Abdomen is soft and nontender.  No evidence of intra-abdominal masses.  Positive bowel sounds noted in all quadrants.   Musculoskeletal: Pain with passive and active ROM of left hand.  Splint in place.   Normal muscle tone.  Neurologic: Patient exhibiting incessant licking of the lips throughout the interview.  CN 2-12 grossly intact. Sensation intact.  Patient moving all 4 extremities spontaneously.  Patient is following all commands.  Patient is responsive to verbal stimuli.   Psychiatric: Patient exhibits normal mood with appropriate affect.  Patient seems to possess insight as to their current situation.    Data Reviewed:  I have personally reviewed and interpreted labs, imaging.  Significant findings are:  Chemistry reveals glucose of 69, creatinine 1.13, sodium 139, potassium 3.5.   CBC revealing white blood cell count 7.9, hemoglobin 11.7, platelet count 182.   Urinalysis unremarkable.   Noncontrast CT imaging of the head revealing no evidence of acute intracranial findings.   CT imaging of the cervical spine without contrast revealing no acute fracture  or subluxation.   CT imaging of the maxillofacial bones revealing no evidence of injury with exception of mild right facial soft tissue swelling.   X-ray of the left hand revealing dorsal dislocation of the ulnar subpapillary subluxation of the fifth digit of the proximal interphalangeal joint without fracture.  EKG: Personally reviewed.  Rhythm is normal sinus rhythm with heart rate of 81 bpm.  No dynamic ST segment changes appreciated.   Assessment and Plan: * Syncope Patient presenting with several falls, episodes of lightheadedness and 2 episodes of syncope , most recent occurring the afternoon of 5/19 Syncope may be orthostatic in origin Placing in observation for overnight monitoring Monitoring patient on telemetry Obtaining echocardiogram Hydrating patient with intravenous fluids Obtaining orthostatic vital signs. Reviewing home medications  Open dislocation of proximal interphalangeal (PIP) joint of left little finger Patient suffered an open dislocation of the left fifth digit during her fall Empiric intravenous antibiotics with vancomycin have been initiated by the emergency department Patient has already been evaluated by Dr. Grandville Silos with hand surgery who has reduced the dislocation and recommends 24 hours of intravenous antibiotics followed by transitioning to a 7-day course of oral antibiotics with close outpatient follow-up with him Dr. Grandville Silos states that his office will contact the patient on Monday to arrange follow-up appointment. Additionally provided patient with.  Opiate-based analgesics for substantial associated pain  Abnormal rhythmic movement of tongue Patient has started rhythmic licking of the lips constantly since arriving to the ED Concern for tardive dyskinesia initially however patient is not on any of the usual offending agents Another possibility is that this could be seizure activity S/P head trauma related to the fall.  Will obtain EEG Based on  clinical course and EEG results will consider discussion with Neurology  Fever Patient exhibiting a single fever of 101 F after arrival to the medical floor Obtaining urinalysis, chest x-ray, COVID-19 PCR testing Blood cultures ordered Patient already on empiric intravenous antibiotics for the open dislocation of her left fifth digit   Mixed hyperlipidemia Continuing home regimen of lipid lowering therapy.   Major depressive disorder Continue home regimen of psychotropic medications       Code Status:  Full code    Consults: Hand Surgery - Dr. Grandville Silos   Severity of Illness:  The appropriate patient status for this patient is OBSERVATION. Observation status is judged to be reasonable and necessary in order to provide the required intensity of service to ensure the patient's safety. The patient's presenting symptoms, physical exam findings, and initial radiographic and laboratory data in the context of their medical condition is felt to place them at decreased risk for further clinical deterioration. Furthermore, it is anticipated that the patient will be medically stable for discharge from the hospital within 2 midnights of admission.   Author:  Vernelle Emerald MD  12/28/2021 8:37 AM

## 2021-12-28 NOTE — Care Management Obs Status (Signed)
[place] NOTIFICATION   Patient Details  Name: Julie Neal MRN: [id number] Date of Birth: Jun 21, 1956   Medicare Observation Status Notification Given:  Yes    Ova Gillentine G., RN 12/28/2021, 10:11 AM

## 2021-12-28 NOTE — Assessment & Plan Note (Signed)
   Patient exhibiting a single fever of 101 F after arrival to the medical floor  Obtaining urinalysis, chest x-ray, COVID-19 PCR testing  Blood cultures ordered  Patient already on empiric intravenous antibiotics for the open dislocation of her left fifth digit

## 2021-12-28 NOTE — Evaluation (Signed)
Occupational Therapy Evaluation Patient Details Name: Julie Neal MRN: [id number] DOB: May 04, 1956 Today's Date: 12/28/2021   History of Present Illness 66 year old female who presented to the hospital s/p fall with LOC and open dislocation of the left fifth digit. Pt with past medical history of hyperlipidemia, migraine headaches, depression, fibromyalgia   Clinical Impression   Julie Neal was evaluated s/p the above admission list, she is generally mod I at baseline with use of a rollator an reports 3 recent falls. She lives with her sister and her brother in law with 24/7 support. Upon evaluation pt was limited by unsteady ambulation with several LOBs, impaired cognition, and decreased activity tolerance. Overall she was close min G for mobility and ADLs with RW. BP taken throughout, negative orthostatic, see below. Pt benefits from OT acutely. Recommend d/c home with [place] and increased assistance from family.   Orthostatic BPs Supine 145/74  Sitting 134/104  Standing 157/96  Standing after 3 min 153/116        Recommendations for follow up therapy are one component of a multi-disciplinary discharge planning process, led by the attending physician.  Recommendations may be updated based on patient status, additional functional criteria and insurance authorization.   Follow Up Recommendations  Home health OT    Assistance Recommended at Discharge Frequent or constant Supervision/Assistance  Patient can return home with the following A little help with walking and/or transfers;A little help with bathing/dressing/bathroom;Assistance with cooking/housework;Assist for transportation    Functional Status Assessment  Patient has had a recent decline in their functional status and demonstrates the ability to make significant improvements in function in a reasonable and predictable amount of time.  Equipment Recommendations  Tub/shower seat       Precautions / Restrictions  Precautions Precautions: Fall Restrictions Weight Bearing Restrictions: No Other Position/Activity Restrictions: no orders placed for L 5th digit, maintained NWB through that finger      Mobility Bed Mobility Overal bed mobility: Needs Assistance Bed Mobility: Supine to Sit     Supine to sit: Supervision          Transfers Overall transfer level: Needs assistance Equipment used: Rolling walker (2 wheels) Transfers: Sit to/from Stand Sit to Stand: Supervision                  Balance Overall balance assessment: Needs assistance Sitting-balance support: Feet supported Sitting balance-Leahy Scale: Good     Standing balance support: Single extremity supported, During functional activity Standing balance-Leahy Scale: Poor Standing balance comment: several LOBs but no physical assist to correct                           ADL either performed or assessed with clinical judgement   ADL Overall ADL's : Needs assistance/impaired Eating/Feeding: Independent;Sitting   Grooming: Min guard;Standing   Upper Body Bathing: Set up;Sitting   Lower Body Bathing: Min guard;Sit to/from stand   Upper Body Dressing : Set up;Sitting   Lower Body Dressing: Min guard;Sit to/from stand   Toilet Transfer: Min guard;Ambulation   Toileting- Clothing Manipulation and Hygiene: Supervision/safety;Sitting/lateral lean       Functional mobility during ADLs: Min guard;Rolling walker (2 wheels) General ADL Comments: unsteady when ambulating with RW, LOB x3 to the R. poor management of RW. limited insigh to safety     Vision Baseline Vision/History: 1 Wears glasses Ability to See in Adequate Light: 0 Adequate Vision Assessment?: No apparent visual deficits  Pertinent Vitals/Pain Pain Assessment Pain Assessment: Faces Faces Pain Scale: Hurts even more Pain Location: L pinky, ribs, head Pain Descriptors / Indicators: Discomfort, Throbbing Pain  Intervention(s): Limited activity within patient's tolerance, Monitored during session     Hand Dominance Right   Extremity/Trunk Assessment Upper Extremity Assessment Upper Extremity Assessment: Generalized weakness;LUE deficits/detail LUE Deficits / Details: 5th digit wrapped, maintained NWB LUE Sensation: WNL LUE Coordination: decreased fine motor   Lower Extremity Assessment Lower Extremity Assessment: Defer to PT evaluation   Cervical / Trunk Assessment Cervical / Trunk Assessment: Normal   Communication Communication Communication: No difficulties   Cognition Arousal/Alertness: Awake/alert Behavior During Therapy: Restless Overall Cognitive Status: No family/caregiver present to determine baseline cognitive functioning Area of Impairment: Attention, Memory, Following commands, Safety/judgement, Awareness, Problem solving                   Current Attention Level: Sustained Memory: Decreased short-term memory Following Commands: Follows one step commands consistently Safety/Judgement: Decreased awareness of safety, Decreased awareness of deficits Awareness: Emergent Problem Solving: Slow processing, Requires verbal cues General Comments: restless full body movements throughout session, pt states this has been going on "for years." Poor attention, required cues for safety.     General Comments  VSS on RA- BP taken throughout, negative orthostatic            Home Living Family/patient expects to be discharged to:: Private residence Living Arrangements: Other relatives (sister and brother in law) Available Help at Discharge: Available 24 hours/day Type of Home: House Home Access: Level entry     Home Layout: Two level Alternate Level Stairs-Number of Steps: flight Alternate Level Stairs-Rails: Left Bathroom Shower/Tub: Teacher, early years/pre: Standard     Home Equipment: Rollator (4 wheels);Cane - single point;Grab bars - tub/shower           Prior Functioning/Environment Prior Level of Function : Independent/Modified Independent;History of Falls (last six months)             Mobility Comments: rollator for mobility, reports 3 falls recently ADLs Comments: indep, manages meds, does not wowrk or drive        OT Problem List: Decreased strength;Decreased range of motion;Decreased activity tolerance;Impaired balance (sitting and/or standing);Decreased cognition;Decreased safety awareness;Pain      OT Treatment/Interventions: Self-care/ADL training;Therapeutic exercise;DME and/or AE instruction;Therapeutic activities;Patient/family education;Balance training    OT Goals(Current goals can be found in the care plan section) Acute Rehab OT Goals Patient Stated Goal: home OT Goal Formulation: With patient Time For Goal Achievement: 01/11/22 Potential to Achieve Goals: Good ADL Goals Pt Will Perform Grooming: with modified independence;standing Pt Will Perform Lower Body Bathing: with modified independence;sit to/from stand Pt Will Perform Lower Body Dressing: with modified independence;sit to/from stand Pt Will Transfer to Toilet: with modified independence;ambulating Additional ADL Goal #1: Pt will indep complete at least 3 ADLs in standing without LOB  OT Frequency: Min 2X/week       AM-PAC OT "6 Clicks" Daily Activity     Outcome Measure Help from another person eating meals?: None Help from another person taking care of personal grooming?: A Little Help from another person toileting, which includes using toliet, bedpan, or urinal?: A Little Help from another person bathing (including washing, rinsing, drying)?: A Little Help from another person to put on and taking off regular upper body clothing?: None Help from another person to put on and taking off regular lower body clothing?: A Little 6 Click Score: 20  End of Session Equipment Utilized During Treatment: Gait belt;Rolling walker (2 wheels) Nurse  Communication: Mobility status  Activity Tolerance: Patient tolerated treatment well Patient left: in chair;with call bell/phone within reach;with chair alarm set  OT Visit Diagnosis: Unsteadiness on feet (R26.81);Other abnormalities of gait and mobility (R26.89);Muscle weakness (generalized) (M62.81);History of falling (Z91.81)                Time: 1062-6948 OT Time Calculation (min): 30 min Charges:  OT General Charges $OT Visit: 1 Visit OT Evaluation $OT Eval Moderate Complexity: 1 Mod OT Treatments $Therapeutic Activity: 8-22 mins    Anvika Gashi A Lakoda Mcanany 12/28/2021, 9:42 AM

## 2021-12-28 NOTE — Plan of Care (Signed)
A&Ox4 but possible memory impairment. Constant movement of her tongue in and out her mouth. No teeth but eating and drinking ok. Temp reached 101, coming down post-PRN tylenol. HR mostly 100-120s in sinus tach. Complains of 10 out of 10 pain mostly at right facial hematoma. Given 1 time dose IV dilaudid and new orders for PRN percocet given. Some anxiety noted but patient very pleasant and cooperative. Placed on airborne/contact precautions, needing respiratory panel and a repeat urinalysis collected.   Problem: Education: Goal: Knowledge of General Education information will improve Description: Including pain rating scale, medication(s)/side effects and non-pharmacologic comfort measures Outcome: Progressing   Problem: Health Behavior/Discharge Planning: Goal: Ability to manage health-related needs will improve Outcome: Progressing   Problem: Clinical Measurements: Goal: Ability to maintain clinical measurements within normal limits will improve Outcome: Progressing Goal: Will remain free from infection Outcome: Progressing Goal: Diagnostic test results will improve Outcome: Progressing Goal: Respiratory complications will improve Outcome: Progressing Goal: Cardiovascular complication will be avoided Outcome: Progressing   Problem: Activity: Goal: Risk for activity intolerance will decrease Outcome: Progressing   Problem: Nutrition: Goal: Adequate nutrition will be maintained Outcome: Progressing   Problem: Coping: Goal: Level of anxiety will decrease Outcome: Progressing   Problem: Elimination: Goal: Will not experience complications related to bowel motility Outcome: Progressing Goal: Will not experience complications related to urinary retention Outcome: Progressing   Problem: Pain Managment: Goal: General experience of comfort will improve Outcome: Progressing   Problem: Safety: Goal: Ability to remain free from injury will improve Outcome: Progressing    Problem: Skin Integrity: Goal: Risk for impaired skin integrity will decrease Outcome: Progressing

## 2021-12-28 NOTE — Assessment & Plan Note (Addendum)
   Patient presenting with several falls, episodes of lightheadedness and 2 episodes of syncope , most recent occurring the afternoon of 5/19  Syncope may be orthostatic in origin  Placing in observation for overnight monitoring  Monitoring patient on telemetry  Obtaining echocardiogram  Hydrating patient with intravenous fluids  Obtaining orthostatic vital signs.  Reviewing home medications

## 2021-12-28 NOTE — Assessment & Plan Note (Signed)
.   Continuing home regimen of lipid lowering therapy.  

## 2021-12-28 NOTE — Progress Notes (Signed)
   12/28/21 0433  Assess: MEWS Score  Temp 99 F (37.2 C)  BP (!) 133/91  Pulse Rate (!) 113  ECG Heart Rate (!) 111  Resp 18  SpO2 92 %  O2 Device Room Air  Assess: MEWS Score  MEWS Temp 0  MEWS Systolic 0  MEWS Pulse 2  MEWS RR 0  MEWS LOC 0  MEWS Score 2  MEWS Score Color Yellow  Assess: if the MEWS score is Yellow or Red  Were vital signs taken at a resting state? Yes  Focused Assessment No change from prior assessment  Early Detection of Sepsis Score *See Row Information* Low  MEWS guidelines implemented *See Row Information* Yes  Treat  Pain Scale 0-10  Pain Score 10  Pain Intervention(s) Medication (See eMAR)  Take Vital Signs  Increase Vital Sign Frequency  Yellow: Q 2hr X 2 then Q 4hr X 2, if remains yellow, continue Q 4hrs  Escalate  MEWS: Escalate Yellow: discuss with charge nurse/RN and consider discussing with provider and RRT  Notify: Charge Nurse/RN  Name of Charge Nurse/RN Notified Dana, RN  Date Charge Nurse/RN Notified 12/28/21  Time Charge Nurse/RN Notified [phone]  Document  Patient Outcome Stabilized after interventions

## 2021-12-28 NOTE — Assessment & Plan Note (Addendum)
   Patient has started rhythmic licking of the lips constantly since arriving to the ED  Concern for tardive dyskinesia initially however patient is not on any of the usual offending agents  Another possibility is that this could be seizure activity S/P head trauma related to the fall.  Will obtain EEG  Based on clinical course and EEG results will consider discussion with Neurology

## 2021-12-29 LAB — CBC WITH DIFFERENTIAL/PLATELET
Abs Immature Granulocytes: 0.02 10*3/uL (ref 0.00–0.07)
Basophils Absolute: 0 10*3/uL (ref 0.0–0.1)
Basophils Relative: 0 %
Eosinophils Absolute: 0.1 10*3/uL (ref 0.0–0.5)
Eosinophils Relative: 3 %
HCT: 35 % — ABNORMAL LOW (ref 36.0–46.0)
Hemoglobin: 11.8 g/dL — ABNORMAL LOW (ref 12.0–15.0)
Immature Granulocytes: 0 %
Lymphocytes Relative: 28 %
Lymphs Abs: 1.6 10*3/uL (ref 0.7–4.0)
MCH: 31.2 pg (ref 26.0–34.0)
MCHC: 33.7 g/dL (ref 30.0–36.0)
MCV: 92.6 fL (ref 80.0–100.0)
Monocytes Absolute: 0.7 10*3/uL (ref 0.1–1.0)
Monocytes Relative: 13 %
Neutro Abs: 3.2 10*3/uL (ref 1.7–7.7)
Neutrophils Relative %: 56 %
Platelets: 176 10*3/uL (ref 150–400)
RBC: 3.78 MIL/uL — ABNORMAL LOW (ref 3.87–5.11)
RDW: 12.5 % (ref 11.5–15.5)
WBC: 5.6 10*3/uL (ref 4.0–10.5)
nRBC: 0 % (ref 0.0–0.2)

## 2021-12-29 LAB — BASIC METABOLIC PANEL
Anion gap: 6 (ref 5–15)
BUN: 15 mg/dL (ref 8–23)
CO2: 26 mmol/L (ref 22–32)
Calcium: 8.9 mg/dL (ref 8.9–10.3)
Chloride: 109 mmol/L (ref 98–111)
Creatinine, Ser: 0.95 mg/dL (ref 0.44–1.00)
GFR, Estimated: >60 mL/min (ref >60)
Glucose, Bld: 95 mg/dL (ref 70–99)
Potassium: 3.8 mmol/L (ref 3.5–5.1)
Sodium: 141 mmol/L (ref 135–145)

## 2021-12-29 MED ORDER — DOXYCYCLINE MONOHYDRATE 100 MG PO TABS: 100.0000 mg | ORAL_TABLET | Freq: Two times a day (BID) | ORAL | 0 refills | Status: DC

## 2021-12-29 NOTE — Plan of Care (Signed)

## 2021-12-29 NOTE — TOC Initial Note (Signed)
Transition of Care [facility]) - Initial/Assessment Note    Patient Details  Name: Julie Neal MRN: [id number] Date of Birth: Sep 23, 1955  [facility] CM/SW Contact:    Gayla Medicus., RN Phone Number: 12/29/2021, 10:18 AM  Clinical Narrative:                 66 year old female with past medical history of hyperlipidemia, migraine headaches, depression, fibromyalgia who presents to [facility] emergency department via EMS after experiencing an episode of loss of consciousness.  RNCM received order for [facility] PT/OT.  RNCM spoke to patient and offered choice for [facility] services.  Patient with no preference so Georgina Snell at [facility] contacted with order and confirmation received.  Expected Discharge Plan: [place] Barriers to Discharge: No Barriers Identified  Patient Goals and CMS Choice Patient states their goals for this hospitalization and ongoing recovery are:: To be able to return to her home CMS Medicare.gov Compare Post Acute Care list provided to:: Patient Choice offered to / list presented to : Patient Expected Discharge Plan and Services Expected Discharge Plan: [place] In-house Referral: NA Discharge Planning Services: CM Consult Post Acute Care Choice: [place] arrangements for the past 2 months: Single Family Home Expected Discharge Date: 12/29/21               DME Arranged: N/A DME Agency: NA       HH Arranged: PT, OT HH Agency: [place] Date [facility] Agency Contacted: 12/29/21 Time [place]: [id number] Representative spoke with at [place]: Georgina Snell  Prior Living Arrangements/Services Living arrangements for the past 2 months: [place] Lives with:: Self Patient language and need for interpreter reviewed:: Yes Do you feel safe going back to the place where you live?: Yes      Need for Family Participation in Patient Care: No (Comment) Care giver support system in place?: Yes (comment)    Criminal Activity/Legal Involvement Pertinent to Current Situation/Hospitalization: No - Comment as needed  Activities of Daily Living     Permission Sought/Granted Permission sought to share information with : Case Manager           Emotional Assessment Appearance:: Appears stated age Attitude/Demeanor/Rapport: Engaged Affect (typically observed): Accepting Orientation: : Oriented to Self, Oriented to Place, Oriented to  Time, Oriented to Situation   Psych Involvement: No (comment)  Admission diagnosis:  Dizziness [R42] Syncope [R55] Dislocation of finger, initial encounter [S63.259A] Fall, initial encounter [W19.XXXA] Patient Active Problem List   Diagnosis Date Noted   Open dislocation of proximal interphalangeal (PIP) joint of left little finger 12/28/2021   Major depressive disorder 12/28/2021   Fever 12/28/2021   Abnormal rhythmic movement of tongue 12/28/2021   Syncope 12/27/2021   At high risk for falls 06/26/2021   Hyperglycemia 01/29/2021   Esophageal spasm 01/29/2021   Bilateral low back pain with sciatica 11/05/2020   Gait abnormality 10/04/2020   Paresthesia 10/04/2020   Leg length inequality 02/16/2020   Scoliosis deformity of spine 02/16/2020   Finger pain, left 06/08/2017   Asymptomatic stenosis of right carotid artery    Fracture of ankle, bimalleolar, left, closed 08/10/2014   Shaking 01/12/2013   Anxiety    Memory loss    Lumbago    Fibromyalgia    Migraines    Mixed hyperlipidemia    Depression, recurrent (HCC)    Neuralgic migraines    PULMONARY NODULE 07/25/2010   H/O migraine 01/05/2007  PCP:  Flossie Buffy, NP Pharmacy:   [facility] [address] ([address]), [place] - [address] [place] ([place]) [place] [address] Phone: [phone] Fax: [phone]  [id number] [place] [id number]:  [phone]: [phone] Social Determinants of Health (SDOH) Interventions   Readmission Risk Interventions     View : No data to display.

## 2021-12-29 NOTE — Progress Notes (Signed)
Nursing DC note  Patient alert and oriented, verbalized understanding of dc instructions. All belongings and dc papers given to patient. Ccmd made aware of dc order. Piv dcd site unremarkable. Vss.

## 2021-12-29 NOTE — Discharge Summary (Addendum)
[place] Discharge Summary  Julie Neal TKW:[id number] DOB: 04/25/1956 DOA: 12/27/2021  PCP: Flossie Buffy, NP  Admit date: 12/27/2021 Discharge date: 12/29/2021 30 Day Unplanned Readmission Risk Score    Flowsheet Row ED to Hosp-Admission (Current) from 12/27/2021 in [facility]  30 Day Unplanned Readmission Risk Score (%) 10.2 Filed at 12/29/2021 0801       This score is the patient's risk of an unplanned readmission within 30 days of being discharged (0 -100%). The score is based on dignosis, age, lab data, medications, orders, and past utilization.   Low:  0-14.9   Medium: 15-21.9   High: 22-29.9   Extreme: 30 and above          Admitted From: Home Disposition: Home  Recommendations for Outpatient Follow-up:  Follow up with PCP in 1-2 weeks Please obtain BMP/CBC in one week Follow-up with hand surgery/Dr. Grandville Silos per their scheduled appointment time Please follow up with your PCP on the following pending results: Unresulted Labs (From admission, onward)    None         Home Health: Yes Equipment/Devices: None  Discharge Condition: Stable CODE STATUS: Full code Diet recommendation: Cardiac  Subjective: Seen and examined.  Overall feels well other than some migraine headache which is chronic for her.  No new complaint.  She is requesting a discharge.  Brief/Interim Summary: 66 year old female with past medical history of hyperlipidemia, migraine headaches, depression, fibromyalgia who presented to [facility] ED via EMS after experiencing an episode of loss of consciousness.   Patient explains that she was walking out to her mailbox to get her mail the afternoon of 5/19 when she suddenly began to experience lightheadedness.  Shortly thereafter she woke up on the ground.  Patient states that she immediately felt severe pain in her left fifth digit,  As of late the patient has frequently felt dizzy and fallen several times.   Patient does report a similar episode of LOC 3 weeks ago but did not seek medical attention at the time.  No other complaint.  On arrival to [facility] emergency department patient was found to have an open dislocation of the left fifth digit.  Tdap was administered.  Intravenous vancomycin was initiated for prophylaxis considering the open dislocation.  Patient was seen by hand surgery Dr. Grandville Silos in the ED and underwent  reduction of left small finger PIP dislocation with a digital block and 1 cm traumatic simple wound closure.  Patient did have low-grade fever the similar night.  She was continued on IV vancomycin for 24 hours per hand surgery recommendations.  She has remained afebrile for now 36 hours.  She was seen by PT OT and they recommended home with home health.  Patient wanted to go home yesterday but we convinced her to stay another day for observation.  Of note, patient was also found to be having lipsmacking consistent with tardive dyskinesia and it was thought that she was taking bupropion which could be the culprit, this medication was stopped yesterday, patient's tardive dyskinesia has improved significantly.  Patient is being discharged today with 7 days of oral clindamycin prescribed by Dr. Grandville Silos.  Discharge Diagnoses:  Principal Problem:   Syncope Active Problems:   Open dislocation of proximal interphalangeal (PIP) joint of left little finger   Fever   Abnormal rhythmic movement of tongue   Mixed hyperlipidemia   Major depressive disorder    Discharge Instructions   Allergies as of 12/29/2021  Reactions   Mobic [meloxicam] Anaphylaxis   Penicillins Anaphylaxis   Nubain [nalbuphine Hcl] Diarrhea, Nausea And Vomiting        Medication List     STOP taking these medications    buPROPion 300 MG 24 hr tablet Commonly known as: WELLBUTRIN XL   minocycline 50 MG capsule Commonly known as: MINOCIN       TAKE these medications    atorvastatin  40 MG tablet Commonly known as: LIPITOR Take 1 tablet (40 mg total) by mouth daily.   CALCIUM CITRATE PO Take by mouth 2 (two) times daily.   clindamycin 300 MG capsule Commonly known as: CLEOCIN Take 1 capsule (300 mg total) by mouth 3 (three) times daily for 7 days.   diazepam 5 MG tablet Commonly known as: VALIUM TAKE 1 TABLET BY MOUTH EVERY 12 HOURS AS NEEDED FOR MUSCLE SPASM (ESOPHAGEAL  SPASM)   DULoxetine 60 MG capsule Commonly known as: CYMBALTA Take 1 capsule (60 mg total) by mouth daily.   mupirocin ointment 2 % Commonly known as: BACTROBAN Apply 1 application topically 2 (two) times daily. x7day   naloxone 4 MG/0.1ML Liqd nasal spray kit Commonly known as: NARCAN SMARTSIG:Spray(s) In Nostril   oxyCODONE-acetaminophen 10-325 MG tablet Commonly known as: PERCOCET Take 1 tablet by mouth every 6 (six) hours as needed.   promethazine 25 MG tablet Commonly known as: PHENERGAN Take 1 tablet (25 mg total) by mouth every 8 (eight) hours as needed for nausea or vomiting.   SUMAtriptan 50 MG tablet Commonly known as: Imitrex Take 1 tablet (50 mg total) by mouth once as needed for up to 1 dose for migraine (may repeat x 1 tab in 1-2hrs if headache persists). May repeat in 2 hours if headache persists or recurs.   tiZANidine 4 MG tablet Commonly known as: ZANAFLEX TAKE 1 TABLET BY MOUTH 4 TIMES A DAY AS NEEDED   traZODone 100 MG tablet Commonly known as: DESYREL TAKE 2 TABLETS BY MOUTH IN THE EVENING AT BEDTIME   VITAMIN D3 PO Take by mouth daily.        Follow-up Information     Milly Jakob, MD Follow up.   Specialty: Orthopedic Surgery Why: my office will call you on Monday to make arrangements for continued care Contact information: [place] [address] [phone]         [facility] EMERGENCY DEPARTMENT .   Specialty: Emergency Medicine Why: If symptoms worsen Contact information: [address] [id number] [place] [phone]        Flossie Buffy, NP Follow up in 1 week(s).   Specialty: Internal Medicine Contact information: [place] [address] [phone]                Allergies  Allergen Reactions   Mobic [Meloxicam] Anaphylaxis   Penicillins Anaphylaxis   Nubain [Nalbuphine Hcl] Diarrhea and Nausea And Vomiting    Consultations: Hand surgery   Procedures/Studies: DG Chest 1 View  Result Date: 12/28/2021 CLINICAL DATA:  66 year old female with possible pneumonia. EXAM: CHEST  1 VIEW COMPARISON:  Chest CT 08/06/2010 and earlier. FINDINGS: Portable AP semi upright view at 0353 hours. Chronic stimulator device electrodes extending cephalad into the neck. New mid to lower thoracic spinal stimulator since 2011. Mildly lower lung volumes. Normal cardiac size and mediastinal contours. Allowing for portable technique the lungs are clear. No pneumothorax or pleural effusion. Chronic cervical ACDF. No acute osseous abnormality  identified. Paucity of bowel gas in the visible upper abdomen. IMPRESSION: No acute cardiopulmonary abnormality. Electronically Signed   By: Genevie Ann M.D.   On: 12/28/2021 04:01   CT Head Wo Contrast  Result Date: 12/27/2021 CLINICAL DATA:  Head trauma, moderate-severe; Neck trauma (Age >= 65y); Facial trauma, blunt. Fall from standing, head injury, loss of consciousness. EXAM: CT HEAD WITHOUT CONTRAST CT MAXILLOFACIAL WITHOUT CONTRAST CT CERVICAL SPINE WITHOUT CONTRAST TECHNIQUE: Multidetector CT imaging of the head, cervical spine, and maxillofacial structures were performed using the standard protocol without intravenous contrast. Multiplanar CT image reconstructions of the cervical spine and maxillofacial structures were also generated. RADIATION DOSE REDUCTION: This exam was performed according to the departmental dose-optimization program which includes automated exposure  control, adjustment of the mA and/or kV according to patient size and/or use of iterative reconstruction technique. COMPARISON:  None Available. FINDINGS: CT HEAD FINDINGS Brain: Normal anatomic configuration. Parenchymal volume loss is commensurate with the patient's age. Mild subcortical and periventricular white matter changes are present likely reflecting the sequela of small vessel ischemia. No abnormal intra or extra-axial mass lesion or fluid collection. No abnormal mass effect or midline shift. No evidence of acute intracranial hemorrhage or infarct. Ventricular size is normal. Cerebellum unremarkable. Vascular: No asymmetric hyperdense vasculature at the skull base. Skull: Intact Other: Mastoid air cells and middle ear cavities are clear. CT MAXILLOFACIAL FINDINGS Osseous: Imaging is slightly limited by motion artifact. No fracture or mandibular dislocation. No destructive process. Orbits: Negative. No traumatic or inflammatory finding. Sinuses: Small air-fluid level within the right maxillary sinus is nonspecific. The remaining paranasal sinuses are clear. Soft tissues: Moderate right preseptal soft tissue swelling. Mild soft tissue swelling of the subcutaneous fat of the right buccal soft tissues. CT CERVICAL SPINE FINDINGS Alignment: Imaging is slightly limited by motion artifact. Normal alignment. No listhesis. Skull base and vertebrae: Craniocervical alignment is normal. The atlantodental interval is not widened. No acute fracture of the cervical spine. Anterior cervical discectomy and fusion with instrumentation of C5-6 has been performed with solid incorporation of interbody bone graft. Soft tissues and spinal canal: Dorsal column stimulator leads are seen with their terminal pads extending from the level of the foramen magnum posterior to the C2 and C3 vertebral bodies. Streak artifact related to the pass slightly limits evaluation of the spinal canal in this region. There is moderate central canal  stenosis at C2-3 secondary to osteophytic ridging in combination with the device. No canal hematoma. No prevertebral soft tissue swelling or paraspinal fluid collections identified. Disc levels: There is intervertebral disc space narrowing and endplate remodeling at C9-4 in keeping with changes of moderate degenerative disc disease. Remaining intervertebral disc heights are preserved. Prevertebral soft tissues are not thickened on sagittal reformats. Review of the axial images demonstrates moderate right neuroforaminal narrowing at C2-3 secondary to uncovertebral and facet arthrosis, mild bilateral neuroforaminal narrowing at C3-4 secondary to facet arthrosis, mild bilateral neuroforaminal narrowing at C6-7 secondary to uncovertebral arthrosis. Upper chest: Unremarkable Other: None IMPRESSION: No acute intracranial injury.  No calvarial fracture. Moderate right preseptal and mild right facial soft tissue swelling. No facial fracture or mandibular dislocation. Small air-fluid level within the right maxillary sinus, nonspecific. No acute fracture or listhesis of the cervical spine. Electronically Signed   By: Fidela Salisbury M.D.   On: 12/27/2021 19:56   CT Cervical Spine Wo Contrast  Result Date: 12/27/2021 CLINICAL DATA:  Head trauma, moderate-severe; Neck trauma (Age >= 65y); Facial trauma, blunt. Fall from standing, head  injury, loss of consciousness. EXAM: CT HEAD WITHOUT CONTRAST CT MAXILLOFACIAL WITHOUT CONTRAST CT CERVICAL SPINE WITHOUT CONTRAST TECHNIQUE: Multidetector CT imaging of the head, cervical spine, and maxillofacial structures were performed using the standard protocol without intravenous contrast. Multiplanar CT image reconstructions of the cervical spine and maxillofacial structures were also generated. RADIATION DOSE REDUCTION: This exam was performed according to the departmental dose-optimization program which includes automated exposure control, adjustment of the mA and/or kV according to  patient size and/or use of iterative reconstruction technique. COMPARISON:  None Available. FINDINGS: CT HEAD FINDINGS Brain: Normal anatomic configuration. Parenchymal volume loss is commensurate with the patient's age. Mild subcortical and periventricular white matter changes are present likely reflecting the sequela of small vessel ischemia. No abnormal intra or extra-axial mass lesion or fluid collection. No abnormal mass effect or midline shift. No evidence of acute intracranial hemorrhage or infarct. Ventricular size is normal. Cerebellum unremarkable. Vascular: No asymmetric hyperdense vasculature at the skull base. Skull: Intact Other: Mastoid air cells and middle ear cavities are clear. CT MAXILLOFACIAL FINDINGS Osseous: Imaging is slightly limited by motion artifact. No fracture or mandibular dislocation. No destructive process. Orbits: Negative. No traumatic or inflammatory finding. Sinuses: Small air-fluid level within the right maxillary sinus is nonspecific. The remaining paranasal sinuses are clear. Soft tissues: Moderate right preseptal soft tissue swelling. Mild soft tissue swelling of the subcutaneous fat of the right buccal soft tissues. CT CERVICAL SPINE FINDINGS Alignment: Imaging is slightly limited by motion artifact. Normal alignment. No listhesis. Skull base and vertebrae: Craniocervical alignment is normal. The atlantodental interval is not widened. No acute fracture of the cervical spine. Anterior cervical discectomy and fusion with instrumentation of C5-6 has been performed with solid incorporation of interbody bone graft. Soft tissues and spinal canal: Dorsal column stimulator leads are seen with their terminal pads extending from the level of the foramen magnum posterior to the C2 and C3 vertebral bodies. Streak artifact related to the pass slightly limits evaluation of the spinal canal in this region. There is moderate central canal stenosis at C2-3 secondary to osteophytic ridging in  combination with the device. No canal hematoma. No prevertebral soft tissue swelling or paraspinal fluid collections identified. Disc levels: There is intervertebral disc space narrowing and endplate remodeling at X1-0 in keeping with changes of moderate degenerative disc disease. Remaining intervertebral disc heights are preserved. Prevertebral soft tissues are not thickened on sagittal reformats. Review of the axial images demonstrates moderate right neuroforaminal narrowing at C2-3 secondary to uncovertebral and facet arthrosis, mild bilateral neuroforaminal narrowing at C3-4 secondary to facet arthrosis, mild bilateral neuroforaminal narrowing at C6-7 secondary to uncovertebral arthrosis. Upper chest: Unremarkable Other: None IMPRESSION: No acute intracranial injury.  No calvarial fracture. Moderate right preseptal and mild right facial soft tissue swelling. No facial fracture or mandibular dislocation. Small air-fluid level within the right maxillary sinus, nonspecific. No acute fracture or listhesis of the cervical spine. Electronically Signed   By: Fidela Salisbury M.D.   On: 12/27/2021 19:56   CT KNEE LEFT WO CONTRAST  Result Date: 11/29/2021 CLINICAL DATA:  Status post fall.  Knee pain. EXAM: CT OF THE LEFT KNEE WITHOUT CONTRAST TECHNIQUE: Multidetector CT imaging of the left knee was performed according to the standard protocol. Multiplanar CT image reconstructions were also generated. RADIATION DOSE REDUCTION: This exam was performed according to the departmental dose-optimization program which includes automated exposure control, adjustment of the mA and/or kV according to patient size and/or use of iterative reconstruction technique. COMPARISON:  None. FINDINGS: Bones/Joint/Cartilage Generalized osteopenia. Linear area of subchondral sclerosis involving the weight-bearing aspect of the medial femoral condyle, lateral trochlea and to lesser extent posterior nonweightbearing surface of the lateral  femoral condyle which may reflect areas of osteonecrosis or insufficiency fractures without displacement or depression. No other acute fracture or dislocation. Normal alignment. No joint effusion. Ligaments Ligaments are suboptimally evaluated by CT. Muscles and Tendons Muscles are normal. No muscle atrophy. No intramuscular fluid collection or hematoma. Patellar tendon and quadriceps tendon are intact. Soft tissue No fluid collection or hematoma.  No soft tissue mass. IMPRESSION: 1. Linear area of subchondral sclerosis involving the weight-bearing aspect of the medial femoral condyle, lateral trochlea and to lesser extent posterior nonweightbearing surface of the lateral femoral condyle which may reflect areas of osteonecrosis or insufficiency fractures without displacement or depression. Electronically Signed   By: Kathreen Devoid M.D.   On: 11/29/2021 14:46   DG Hand Complete Left  Result Date: 12/27/2021 CLINICAL DATA:  Open little finger fracture.  Fall. EXAM: LEFT HAND - COMPLETE 3+ VIEW COMPARISON:  Radiograph 01/29/2019 FINDINGS: There is dorsal dislocation and ulnar subluxation of the fifth digit at the proximal interphalangeal joint. The middle phalanx is perched on the proximal phalanx. There is air in the adjacent soft tissues. No definite fracture is seen. No additional acute abnormality of the hand. Mild multifocal osteoarthritis. IMPRESSION: Dorsal dislocation and ulnar subluxation of the fifth digit at the proximal interphalangeal joint. No definite fracture. Air in the adjacent soft tissues may indicate breech of the skin/open injury. Electronically Signed   By: Keith Rake M.D.   On: 12/27/2021 19:27   DG Finger Little Left  Result Date: 12/27/2021 CLINICAL DATA:  Presented with dorsomedial small finger PIP joint open dislocation, follow-up image obtained postreduction. EXAM: LEFT LITTLE FINGER 2+V COMPARISON:  Left hand series on presentation to the emergency department today. FINDINGS:  An overlying splint limits the AP projection. There is soft tissue swelling. Bones are osteopenic. Left hand small finger PIP joint dislocation has been successfully reduced. The lateral image demonstrates a slightly inferiorly distracted volar plate chip fracture of the ventral articulating surface of the base of the middle phalanx. There is no further evidence of fractures. There is again noted mild arthrosis of the interphalangeal joints of the ring finger and small finger, trace spurring at the ulnar aspect of the fifth MCP joint. IMPRESSION: 1. Successful dislocation reduction. 2. Volar plate chip fracture of the ventral articulating surface of the base of the small finger middle phalanx, slightly inferiorly distracted. No other visible fractures. 3. Osteopenia and degenerative change. Electronically Signed   By: Telford Nab M.D.   On: 12/27/2021 22:56   EEG adult  Result Date: 12/28/2021 Greta Doom, MD     12/28/2021  3:33 PM History: 66 year old female being evaluated for transient loss of consciousness Sedation: None Technique: This EEG was acquired with electrodes placed according to the International 10-20 electrode system (including Fp1, Fp2, F3, F4, C3, C4, P3, P4, O1, O2, T3, T4, T5, T6, A1, A2, Fz, Cz, Pz). The following electrodes were missing or displaced: none. Background: The background consists of intermixed alpha and beta activities. There is a well defined posterior dominant rhythm of nine hz that attenuates with eye opening.  There is significant movement artifact, both eye movement as well as head movement throughout the study, but no epileptiform activity was seen. Photic stimulation: Physiologic driving is now performed EEG Abnormalities: None Clinical Interpretation: This EEG was normal  in the waking and drowsy state. There was no seizure or seizure predisposition recorded on this study. Please note that lack of epileptiform activity on EEG does not preclude the  possibility of epilepsy. Roland Rack, MD [facility] [phone] If 7pm- 7am, please page neurology on call as listed in [place].   ECHOCARDIOGRAM COMPLETE  Result Date: 12/28/2021    ECHOCARDIOGRAM REPORT   Patient Name:   Julie Neal Date of Exam: 12/28/2021 Medical Rec #:  [id number]      Height:       67.0 in Accession #:    [id number]     Weight:       168.3 lb Date of Birth:  [birth year]      BSA:          1.879 m Patient Age:    24 years       BP:           145/74 mmHg Patient Gender: F              HR:           85 bpm. Exam Location:  Inpatient Procedure: 2D Echo, Cardiac Doppler and Color Doppler Indications:    Syncope  History:        Patient has no prior history of Echocardiogram examinations.  Sonographer:    Jefferey Pica Referring Phys: [id number] Andrew  1. Left ventricular ejection fraction, by estimation, is 70 to 75%. The left ventricle has hyperdynamic function. The left ventricle has no regional wall motion abnormalities. There is mild left ventricular hypertrophy. Left ventricular diastolic parameters were normal.  2. Right ventricular systolic function is normal. The right ventricular size is normal. There is normal pulmonary artery systolic pressure.  3. The mitral valve is normal in structure. Mild mitral valve regurgitation.  4. The aortic valve is tricuspid. Aortic valve regurgitation is not visualized. Aortic valve sclerosis/calcification is present, without any evidence of aortic stenosis. FINDINGS  Left Ventricle: Left ventricular ejection fraction, by estimation, is 70 to 75%. The left ventricle has hyperdynamic function. The left ventricle has no regional wall motion abnormalities. The left ventricular internal cavity size was normal in size. There is mild left ventricular hypertrophy. Left ventricular diastolic parameters were normal. Right Ventricle: The right ventricular size is normal. Right vetricular wall thickness was not assessed.  Right ventricular systolic function is normal. There is normal pulmonary artery systolic pressure. The tricuspid regurgitant velocity is 2.41 m/s, and with an assumed right atrial pressure of 5 mmHg, the estimated right ventricular systolic pressure is 24.4 mmHg. Left Atrium: Left atrial size was normal in size. Right Atrium: Right atrial size was normal in size. Pericardium: There is no evidence of pericardial effusion. Mitral Valve: The mitral valve is normal in structure. Mild mitral valve regurgitation. Tricuspid Valve: The tricuspid valve is normal in structure. Tricuspid valve regurgitation is mild. Aortic Valve: The aortic valve is tricuspid. Aortic valve regurgitation is not visualized. Aortic regurgitation PHT measures 453 msec. Aortic valve sclerosis/calcification is present, without any evidence of aortic stenosis. Aortic valve peak gradient measures 12.7 mmHg. Pulmonic Valve: The pulmonic valve was grossly normal. Pulmonic valve regurgitation is not visualized. No evidence of pulmonic stenosis. Aorta: The aortic root and ascending aorta are structurally normal, with no evidence of dilitation. IAS/Shunts: No atrial level shunt detected by color flow Doppler.  LEFT VENTRICLE PLAX 2D LVIDd:         3.90 cm   Diastology LVIDs:  2.10 cm   LV e' medial:    6.57 cm/s LV PW:         1.20 cm   LV E/e' medial:  8.1 LV IVS:        1.20 cm   LV e' lateral:   10.20 cm/s LVOT diam:     1.80 cm   LV E/e' lateral: 5.2 LV SV:         81 LV SV Index:   43 LVOT Area:     2.54 cm  RIGHT VENTRICLE             IVC RV Basal diam:  3.00 cm     IVC diam: 1.80 cm RV S prime:     12.30 cm/s TAPSE (M-mode): 1.9 cm LEFT ATRIUM             Index        RIGHT ATRIUM           Index LA diam:        3.90 cm 2.08 cm/m   RA Area:     10.70 cm LA Vol (A2C):   40.1 ml 21.34 ml/m  RA Volume:   20.60 ml  10.96 ml/m LA Vol (A4C):   34.1 ml 18.15 ml/m LA Biplane Vol: 38.5 ml 20.49 ml/m  AORTIC VALVE                  PULMONIC VALVE  AV Area (Vmax): 2.23 cm      PV Vmax:       1.20 m/s AV Vmax:        178.00 cm/s   PV Peak grad:  5.8 mmHg AV Peak Grad:   12.7 mmHg LVOT Vmax:      156.00 cm/s LVOT Vmean:     103.000 cm/s LVOT VTI:       0.317 m AI PHT:         453 msec  AORTA Ao Root diam: 3.10 cm Ao Asc diam:  3.00 cm MITRAL VALVE               TRICUSPID VALVE MV Area (PHT): 4.89 cm    TR Peak grad:   23.2 mmHg MV Decel Time: 155 msec    TR Vmax:        241.00 cm/s MV E velocity: 53.30 cm/s MV A velocity: 69.40 cm/s  SHUNTS MV E/A ratio:  0.77        Systemic VTI:  0.32 m                            Systemic Diam: 1.80 cm Dorris Carnes MD Electronically signed by Dorris Carnes MD Signature Date/Time: 12/28/2021/1:22:12 PM    Final    CT Maxillofacial WO CM  Result Date: 12/27/2021 CLINICAL DATA:  Head trauma, moderate-severe; Neck trauma (Age >= 65y); Facial trauma, blunt. Fall from standing, head injury, loss of consciousness. EXAM: CT HEAD WITHOUT CONTRAST CT MAXILLOFACIAL WITHOUT CONTRAST CT CERVICAL SPINE WITHOUT CONTRAST TECHNIQUE: Multidetector CT imaging of the head, cervical spine, and maxillofacial structures were performed using the standard protocol without intravenous contrast. Multiplanar CT image reconstructions of the cervical spine and maxillofacial structures were also generated. RADIATION DOSE REDUCTION: This exam was performed according to the departmental dose-optimization program which includes automated exposure control, adjustment of the mA and/or kV according to patient size and/or use of iterative reconstruction technique. COMPARISON:  None Available. FINDINGS: CT HEAD FINDINGS  Brain: Normal anatomic configuration. Parenchymal volume loss is commensurate with the patient's age. Mild subcortical and periventricular white matter changes are present likely reflecting the sequela of small vessel ischemia. No abnormal intra or extra-axial mass lesion or fluid collection. No abnormal mass effect or midline shift. No evidence of  acute intracranial hemorrhage or infarct. Ventricular size is normal. Cerebellum unremarkable. Vascular: No asymmetric hyperdense vasculature at the skull base. Skull: Intact Other: Mastoid air cells and middle ear cavities are clear. CT MAXILLOFACIAL FINDINGS Osseous: Imaging is slightly limited by motion artifact. No fracture or mandibular dislocation. No destructive process. Orbits: Negative. No traumatic or inflammatory finding. Sinuses: Small air-fluid level within the right maxillary sinus is nonspecific. The remaining paranasal sinuses are clear. Soft tissues: Moderate right preseptal soft tissue swelling. Mild soft tissue swelling of the subcutaneous fat of the right buccal soft tissues. CT CERVICAL SPINE FINDINGS Alignment: Imaging is slightly limited by motion artifact. Normal alignment. No listhesis. Skull base and vertebrae: Craniocervical alignment is normal. The atlantodental interval is not widened. No acute fracture of the cervical spine. Anterior cervical discectomy and fusion with instrumentation of C5-6 has been performed with solid incorporation of interbody bone graft. Soft tissues and spinal canal: Dorsal column stimulator leads are seen with their terminal pads extending from the level of the foramen magnum posterior to the C2 and C3 vertebral bodies. Streak artifact related to the pass slightly limits evaluation of the spinal canal in this region. There is moderate central canal stenosis at C2-3 secondary to osteophytic ridging in combination with the device. No canal hematoma. No prevertebral soft tissue swelling or paraspinal fluid collections identified. Disc levels: There is intervertebral disc space narrowing and endplate remodeling at Q3-0 in keeping with changes of moderate degenerative disc disease. Remaining intervertebral disc heights are preserved. Prevertebral soft tissues are not thickened on sagittal reformats. Review of the axial images demonstrates moderate right  neuroforaminal narrowing at C2-3 secondary to uncovertebral and facet arthrosis, mild bilateral neuroforaminal narrowing at C3-4 secondary to facet arthrosis, mild bilateral neuroforaminal narrowing at C6-7 secondary to uncovertebral arthrosis. Upper chest: Unremarkable Other: None IMPRESSION: No acute intracranial injury.  No calvarial fracture. Moderate right preseptal and mild right facial soft tissue swelling. No facial fracture or mandibular dislocation. Small air-fluid level within the right maxillary sinus, nonspecific. No acute fracture or listhesis of the cervical spine. Electronically Signed   By: Fidela Salisbury M.D.   On: 12/27/2021 19:56     Discharge Exam: Vitals:   12/28/21 1921 12/29/21 0322  BP: (!) 148/87 (!) 153/75  Pulse: 89 88  Resp: 18 16  Temp: 98.8 F (37.1 C) 98.6 F (37 C)  SpO2: 95% 95%   Vitals:   12/28/21 1300 12/28/21 1632 12/28/21 1921 12/29/21 0322  BP: (!) 131/119 129/77 (!) 148/87 (!) 153/75  Pulse:   89 88  Resp: _0 Temp: 99.2 F (37.3 C) 99 F (37.2 C) 98.8 F (37.1 C) 98.6 F (37 C)  TempSrc: Oral Oral Oral Oral  SpO2: 93% 93% 95% 95%  Weight:      Height:        General: Pt is alert, awake, not in acute distress Cardiovascular: RRR, S1/S2 +, no rubs, no gallops Respiratory: CTA bilaterally, no wheezing, no rhonchi Abdominal: Soft, NT, ND, bowel sounds + Extremities: no edema, no cyanosis    The results of significant diagnostics from this hospitalization (including imaging, microbiology, ancillary and laboratory) are listed below for reference.  Microbiology: Recent Results (from the past 240 hour(s))  Culture, blood (Routine X 2) w Reflex to ID Panel     Status: None (Preliminary result)   Collection Time: 12/28/21  6:17 AM   Specimen: BLOOD LEFT ARM  Result Value Ref Range Status   Specimen Description BLOOD LEFT ARM  Final   Special Requests   Final    AEROBIC BOTTLE ONLY Blood Culture results may not be optimal due  to an excessive volume of blood received in culture bottles   Culture   Final    NO GROWTH 1 DAY Performed at [facility], [place] [address] [place], [place] [address]    Report Status PENDING  Incomplete  Culture, blood (Routine X 2) w Reflex to ID Panel     Status: None (Preliminary result)   Collection Time: 12/28/21  6:17 AM   Specimen: BLOOD LEFT HAND  Result Value Ref Range Status   Specimen Description BLOOD LEFT HAND  Final   Special Requests   Final    AEROBIC BOTTLE ONLY Blood Culture results may not be optimal due to an excessive volume of blood received in culture bottles   Culture   Final    NO GROWTH 1 DAY Performed at [facility], [place] [address] [place], [place] [address]    Report Status PENDING  Incomplete  Resp Panel by RT-PCR (Flu A&B, Covid) Nasopharyngeal Swab     Status: None   Collection Time: 12/28/21  7:27 AM   Specimen: Nasopharyngeal Swab; Nasopharyngeal(NP) swabs in vial transport medium  Result Value Ref Range Status   SARS Coronavirus 2 by RT PCR NEGATIVE NEGATIVE Final    Comment: (NOTE) SARS-CoV-2 target nucleic acids are NOT DETECTED.  The SARS-CoV-2 RNA is generally detectable in upper respiratory specimens during the acute phase of infection. The lowest concentration of SARS-CoV-2 viral copies this assay can detect is 138 copies/mL. A negative result does not preclude SARS-Cov-2 infection and should not be used as the sole basis for treatment or other patient management decisions. A negative result may occur with  improper specimen collection/handling, submission of specimen other than nasopharyngeal swab, presence of viral mutation(s) within the areas targeted by this assay, and inadequate number of viral copies(<138 copies/mL). A negative result must be combined with clinical observations, patient history, and epidemiological information. The expected result is Negative.  Fact Sheet for Patients:   EntrepreneurPulse.com.au  Fact Sheet for Healthcare Providers:  IncredibleEmployment.be  This test is no t yet approved or cleared by the [place] FDA and  has been authorized for detection and/or diagnosis of SARS-CoV-2 by FDA under an Emergency Use Authorization (EUA). This EUA will remain  in effect (meaning this test can be used) for the duration of the COVID-19 declaration under Section 564(b)(1) of the Act, 21 U.S.C.section 360bbb-3(b)(1), unless the authorization is terminated  or revoked sooner.       Influenza A by PCR NEGATIVE NEGATIVE Final   Influenza B by PCR NEGATIVE NEGATIVE Final    Comment: (NOTE) The Xpert Xpress SARS-CoV-2/FLU/RSV plus assay is intended as an aid in the diagnosis of influenza from Nasopharyngeal swab specimens and should not be used as a sole basis for treatment. Nasal washings and aspirates are unacceptable for Xpert Xpress SARS-CoV-2/FLU/RSV testing.  Fact Sheet for Patients: EntrepreneurPulse.com.au  Fact Sheet for Healthcare Providers: IncredibleEmployment.be  This test is not yet approved or cleared by the [place] FDA and has been authorized for detection and/or diagnosis  of SARS-CoV-2 by FDA under an Emergency Use Authorization (EUA). This EUA will remain in effect (meaning this test can be used) for the duration of the COVID-19 declaration under Section 564(b)(1) of the Act, 21 U.S.C. section 360bbb-3(b)(1), unless the authorization is terminated or revoked.  Performed at [facility], [place] [address] [place], [place] [address]      Labs: BNP (last 3 results) No results for input(s): BNP in the last 8760 hours. Basic Metabolic Panel: Recent Labs  Lab 12/27/21 1850 12/28/21 0617 12/29/21 0158  NA 139 138 141  K 3.5 3.9 3.8  CL 108 103 109  CO2 _0 GLUCOSE 69* 102* 95  BUN _1 CREATININE 1.13* 1.20* 0.95  CALCIUM 8.5*  9.6 8.9  MG  --  1.8  --    Liver Function Tests: Recent Labs  Lab 12/28/21 0617  AST 43*  ALT 31  ALKPHOS 43  BILITOT 0.8  PROT 7.2  ALBUMIN 3.8   No results for input(s): LIPASE, AMYLASE in the last 168 hours. No results for input(s): AMMONIA in the last 168 hours. CBC: Recent Labs  Lab 12/27/21 1850 12/28/21 0617 12/29/21 0158  WBC 7.9 9.3 5.6  NEUTROABS 5.4 7.0 3.2  HGB 11.7* 12.6 11.8*  HCT 36.2 37.5 35.0*  MCV 95.5 90.6 92.6  PLT 182 182 176   Cardiac Enzymes: No results for input(s): CKTOTAL, CKMB, CKMBINDEX, TROPONINI in the last 168 hours. BNP: Invalid input(s): POCBNP CBG: Recent Labs  Lab 12/27/21 2242  GLUCAP 110*   D-Dimer No results for input(s): DDIMER in the last 72 hours. Hgb A1c No results for input(s): HGBA1C in the last 72 hours. Lipid Profile No results for input(s): CHOL, HDL, LDLCALC, TRIG, CHOLHDL, LDLDIRECT in the last 72 hours. Thyroid function studies No results for input(s): TSH, T4TOTAL, T3FREE, THYROIDAB in the last 72 hours.  Invalid input(s): FREET3 Anemia work up No results for input(s): VITAMINB12, FOLATE, FERRITIN, TIBC, IRON, RETICCTPCT in the last 72 hours. Urinalysis    Component Value Date/Time   COLORURINE YELLOW 12/27/2021 2016   APPEARANCEUR CLEAR 12/27/2021 2016   LABSPEC 1.020 12/27/2021 2016   PHURINE 5.0 12/27/2021 2016   GLUCOSEU NEGATIVE 12/27/2021 2016   HGBUR NEGATIVE 12/27/2021 2016   [place] NEGATIVE 12/27/2021 2016   KETONESUR NEGATIVE 12/27/2021 2016   PROTEINUR NEGATIVE 12/27/2021 2016   NITRITE NEGATIVE 12/27/2021 2016   LEUKOCYTESUR LARGE (A) 12/27/2021 2016   Sepsis Labs Invalid input(s): PROCALCITONIN,  WBC,  LACTICIDVEN Microbiology Recent Results (from the past 240 hour(s))  Culture, blood (Routine X 2) w Reflex to ID Panel     Status: None (Preliminary result)   Collection Time: 12/28/21  6:17 AM   Specimen: BLOOD LEFT ARM  Result Value Ref Range Status   Specimen Description  BLOOD LEFT ARM  Final   Special Requests   Final    AEROBIC BOTTLE ONLY Blood Culture results may not be optimal due to an excessive volume of blood received in culture bottles   Culture   Final    NO GROWTH 1 DAY Performed at [facility], [place] [address] [place], [place] [address]    Report Status PENDING  Incomplete  Culture, blood (Routine X 2) w Reflex to ID Panel     Status: None (Preliminary result)   Collection Time: 12/28/21  6:17 AM   Specimen: BLOOD LEFT HAND  Result Value Ref Range Status   Specimen Description BLOOD LEFT HAND  Final   Special Requests   Final    AEROBIC BOTTLE ONLY Blood Culture results may not be optimal due to an excessive volume of blood received in culture bottles   Culture   Final    NO GROWTH 1 DAY Performed at [place] [address] [place], [place] [address]    Report Status PENDING  Incomplete  Resp Panel by RT-PCR (Flu A&B, Covid) Nasopharyngeal Swab     Status: None   Collection Time: 12/28/21  7:27 AM   Specimen: Nasopharyngeal Swab; Nasopharyngeal(NP) swabs in vial transport medium  Result Value Ref Range Status   SARS Coronavirus 2 by RT PCR NEGATIVE NEGATIVE Final    Comment: (NOTE) SARS-CoV-2 target nucleic acids are NOT DETECTED.  The SARS-CoV-2 RNA is generally detectable in upper respiratory specimens during the acute phase of infection. The lowest concentration of SARS-CoV-2 viral copies this assay can detect is 138 copies/mL. A negative result does not preclude SARS-Cov-2 infection and should not be used as the sole basis for treatment or other patient management decisions. A negative result may occur with  improper specimen collection/handling, submission of specimen other than nasopharyngeal swab, presence of viral mutation(s) within the areas targeted by this assay, and inadequate number of viral copies(<138 copies/mL). A negative result must be combined with clinical observations, patient history, and  epidemiological information. The expected result is Negative.  Fact Sheet for Patients:  EntrepreneurPulse.com.au  Fact Sheet for Healthcare Providers:  IncredibleEmployment.be  This test is no t yet approved or cleared by the [place] FDA and  has been authorized for detection and/or diagnosis of SARS-CoV-2 by FDA under an Emergency Use Authorization (EUA). This EUA will remain  in effect (meaning this test can be used) for the duration of the COVID-19 declaration under Section 564(b)(1) of the Act, 21 U.S.C.section 360bbb-3(b)(1), unless the authorization is terminated  or revoked sooner.       Influenza A by PCR NEGATIVE NEGATIVE Final   Influenza B by PCR NEGATIVE NEGATIVE Final    Comment: (NOTE) The Xpert Xpress SARS-CoV-2/FLU/RSV plus assay is intended as an aid in the diagnosis of influenza from Nasopharyngeal swab specimens and should not be used as a sole basis for treatment. Nasal washings and aspirates are unacceptable for Xpert Xpress SARS-CoV-2/FLU/RSV testing.  Fact Sheet for Patients: EntrepreneurPulse.com.au  Fact Sheet for Healthcare Providers: IncredibleEmployment.be  This test is not yet approved or cleared by the [place] FDA and has been authorized for detection and/or diagnosis of SARS-CoV-2 by FDA under an Emergency Use Authorization (EUA). This EUA will remain in effect (meaning this test can be used) for the duration of the COVID-19 declaration under Section 564(b)(1) of the Act, 21 U.S.C. section 360bbb-3(b)(1), unless the authorization is terminated or revoked.  Performed at [facility], [place] [address], [place], [place] [address]      Time coordinating discharge: Over 30 minutes  SIGNED:   Darliss Cheney, MD  [facility] 12/29/2021, 11:04 AM *Please note that this is a verbal dictation therefore any spelling or grammatical errors are due to the  "[employer] [employer]" system interpretation. If 7PM-7AM, please contact night-coverage [email or web address]

## 2022-01-01 ENCOUNTER — Telehealth: Payer: Self-pay

## 2022-01-01 NOTE — Telephone Encounter (Signed)
Transition Care Management Unsuccessful Follow-up Telephone Call  Date of discharge and from where:  Julie Neal 12/29/2021  Attempts:  1st Attempt  Reason for unsuccessful TCM follow-up call:  No answer/busy

## 2022-01-02 DIAGNOSIS — S63287A Dislocation of proximal interphalangeal joint of left little finger, initial encounter: Secondary | ICD-10-CM | POA: Diagnosis not present

## 2022-01-02 LAB — CULTURE, BLOOD (ROUTINE X 2)
Culture: NO GROWTH
Culture: NO GROWTH

## 2022-01-14 DIAGNOSIS — M47812 Spondylosis without myelopathy or radiculopathy, cervical region: Secondary | ICD-10-CM | POA: Diagnosis not present

## 2022-01-14 DIAGNOSIS — M62830 Muscle spasm of back: Secondary | ICD-10-CM | POA: Diagnosis not present

## 2022-01-14 DIAGNOSIS — G894 Chronic pain syndrome: Secondary | ICD-10-CM | POA: Diagnosis not present

## 2022-01-14 DIAGNOSIS — M47817 Spondylosis without myelopathy or radiculopathy, lumbosacral region: Secondary | ICD-10-CM | POA: Diagnosis not present

## 2022-01-15 ENCOUNTER — Emergency Department (HOSPITAL_COMMUNITY)
Admission: EM | Admit: 2022-01-15 | Discharge: 2022-01-15 | Disposition: A | Discharge status: Home / Self Care | Payer: Medicare Other | Attending: Emergency Medicine | Admitting: Emergency Medicine

## 2022-01-15 ENCOUNTER — Encounter (HOSPITAL_COMMUNITY): Payer: Self-pay

## 2022-01-15 ENCOUNTER — Emergency Department (HOSPITAL_COMMUNITY): Payer: Medicare Other

## 2022-01-15 DIAGNOSIS — R519 Headache, unspecified: Secondary | ICD-10-CM | POA: Diagnosis not present

## 2022-01-15 DIAGNOSIS — W19XXXA Unspecified fall, initial encounter: Secondary | ICD-10-CM | POA: Insufficient documentation

## 2022-01-15 DIAGNOSIS — R296 Repeated falls: Secondary | ICD-10-CM | POA: Diagnosis not present

## 2022-01-15 DIAGNOSIS — R079 Chest pain, unspecified: Secondary | ICD-10-CM | POA: Diagnosis not present

## 2022-01-15 DIAGNOSIS — R0789 Other chest pain: Secondary | ICD-10-CM | POA: Diagnosis not present

## 2022-01-15 DIAGNOSIS — R42 Dizziness and giddiness: Secondary | ICD-10-CM | POA: Diagnosis not present

## 2022-01-15 DIAGNOSIS — S0990XA Unspecified injury of head, initial encounter: Secondary | ICD-10-CM | POA: Diagnosis not present

## 2022-01-15 DIAGNOSIS — R410 Disorientation, unspecified: Secondary | ICD-10-CM | POA: Diagnosis not present

## 2022-01-15 LAB — CBC WITH DIFFERENTIAL/PLATELET
Abs Immature Granulocytes: 0.01 10*3/uL (ref 0.00–0.07)
Basophils Absolute: 0 10*3/uL (ref 0.0–0.1)
Basophils Relative: 1 %
Eosinophils Absolute: 0.3 10*3/uL (ref 0.0–0.5)
Eosinophils Relative: 6 %
HCT: 36.1 % (ref 36.0–46.0)
Hemoglobin: 12.2 g/dL (ref 12.0–15.0)
Immature Granulocytes: 0 %
Lymphocytes Relative: 39 %
Lymphs Abs: 2 10*3/uL (ref 0.7–4.0)
MCH: 31.5 pg (ref 26.0–34.0)
MCHC: 33.8 g/dL (ref 30.0–36.0)
MCV: 93.3 fL (ref 80.0–100.0)
Monocytes Absolute: 0.4 10*3/uL (ref 0.1–1.0)
Monocytes Relative: 8 %
Neutro Abs: 2.3 10*3/uL (ref 1.7–7.7)
Neutrophils Relative %: 46 %
Platelets: 206 10*3/uL (ref 150–400)
RBC: 3.87 MIL/uL (ref 3.87–5.11)
RDW: 12.8 % (ref 11.5–15.5)
WBC: 5 10*3/uL (ref 4.0–10.5)
nRBC: 0 % (ref 0.0–0.2)

## 2022-01-15 LAB — BASIC METABOLIC PANEL
Anion gap: 6 (ref 5–15)
BUN: 15 mg/dL (ref 8–23)
CO2: 27 mmol/L (ref 22–32)
Calcium: 9.3 mg/dL (ref 8.9–10.3)
Chloride: 105 mmol/L (ref 98–111)
Creatinine, Ser: 1.26 mg/dL — ABNORMAL HIGH (ref 0.44–1.00)
GFR, Estimated: 47 mL/min — ABNORMAL LOW (ref >60)
Glucose, Bld: 154 mg/dL — ABNORMAL HIGH (ref 70–99)
Potassium: 3.9 mmol/L (ref 3.5–5.1)
Sodium: 138 mmol/L (ref 135–145)

## 2022-01-15 LAB — URINALYSIS, ROUTINE W REFLEX MICROSCOPIC
Glucose, UA: NEGATIVE mg/dL
Hgb urine dipstick: NEGATIVE
Ketones, ur: 5 mg/dL — AB
Leukocytes,Ua: NEGATIVE
Nitrite: NEGATIVE
Protein, ur: 30 mg/dL — AB
Specific Gravity, Urine: 1.035 — ABNORMAL HIGH (ref 1.005–1.030)
pH: 5 (ref 5.0–8.0)

## 2022-01-15 MED ORDER — KETOROLAC TROMETHAMINE 15 MG/ML IJ SOLN: 15.0000 mg | Freq: Once | INTRAMUSCULAR | Status: DC

## 2022-01-15 MED ORDER — SODIUM CHLORIDE 0.9 % IV BOLUS
1000.0000 mL | Freq: Once | INTRAVENOUS | Status: AC
  Administered 2022-01-15: 1000 mL via INTRAVENOUS

## 2022-01-15 NOTE — ED Provider Notes (Signed)
[facility] EMERGENCY DEPARTMENT Provider Note   CSN: [id number] Arrival date & time: 01/15/22  1005     History  Chief Complaint  Patient presents with   Julie Neal is a 66 y.o. female.  66 yo F with a chief complaint of frequent falls.  She tells me that she had a fall couple weeks ago when she struck her head and was seen in the hospital.  She was admitted for a open fifth digit dislocation and had this repaired by hand surgery.  She was seen by her pain management physician yesterday and was a bit confused and so they suggested she come to the hospital for evaluation.  She is having some chest pain after the fall.  Has had some headaches off and on.  She feels like she has been eating and drinking normally denies cough congestion or fever denies urinary symptoms denies abdominal pain.   Fall      Home Medications Prior to Admission medications   Medication Sig Start Date End Date Taking? Authorizing Provider  atorvastatin (LIPITOR) 40 MG tablet Take 1 tablet (40 mg total) by mouth daily. 06/26/21   Nche, Charlene Brooke, NP  CALCIUM CITRATE PO Take by mouth 2 (two) times daily.    [provider]  Cholecalciferol (VITAMIN D3 PO) Take by mouth daily.    [provider]  diazepam (VALIUM) 5 MG tablet TAKE 1 TABLET BY MOUTH EVERY 12 HOURS AS NEEDED FOR MUSCLE SPASM (ESOPHAGEAL  SPASM) 11/26/21   Nche, Charlene Brooke, NP  DULoxetine (CYMBALTA) 60 MG capsule Take 1 capsule (60 mg total) by mouth daily. 06/26/21   Nche, Charlene Brooke, NP  mupirocin ointment (BACTROBAN) 2 % Apply 1 application topically 2 (two) times daily. x7day 12/24/20   NcheCharlene Brooke, NP  naloxone [facility]) nasal spray 4 mg/0.1 mL SMARTSIG:Spray(s) In Nostril 02/09/21   [provider]  oxyCODONE-acetaminophen (PERCOCET) 10-325 MG tablet Take 1 tablet by mouth every 6 (six) hours as needed. 02/09/21   [provider]  promethazine (PHENERGAN) 25 MG  tablet Take 1 tablet (25 mg total) by mouth every 8 (eight) hours as needed for nausea or vomiting. 11/22/21   Nche, Charlene Brooke, NP  SUMAtriptan (IMITREX) 50 MG tablet Take 1 tablet (50 mg total) by mouth once as needed for up to 1 dose for migraine (may repeat x 1 tab in 1-2hrs if headache persists). May repeat in 2 hours if headache persists or recurs. 04/02/21   Nche, Charlene Brooke, NP  tiZANidine (ZANAFLEX) 4 MG tablet TAKE 1 TABLET BY MOUTH 4 TIMES A DAY AS NEEDED 02/17/17   [provider]  traZODone (DESYREL) 100 MG tablet TAKE 2 TABLETS BY MOUTH IN THE EVENING AT BEDTIME 09/16/21   Nche, Charlene Brooke, NP      Allergies    Mobic [meloxicam], Penicillins, and Nubain [nalbuphine hcl]    Review of Systems   Review of Systems  Physical Exam Updated Vital Signs BP (!) 93/59 (BP Location: Right Arm)   Pulse 65   Temp 98.2 F (36.8 C) (Oral)   Resp 16   SpO2 98%  Physical Exam Vitals and nursing note reviewed.  Constitutional:      General: She is not in acute distress.    Appearance: She is well-developed. She is not diaphoretic.  HENT:     Head: Normocephalic and atraumatic.  Eyes:     Pupils: Pupils are equal, round, and reactive to  light.  Cardiovascular:     Rate and Rhythm: Normal rate and regular rhythm.     Heart sounds: No murmur heard.   No friction rub. No gallop.  Pulmonary:     Effort: Pulmonary effort is normal.     Breath sounds: No wheezing or rales.  Abdominal:     General: There is no distension.     Palpations: Abdomen is soft.     Tenderness: There is no abdominal tenderness.  Musculoskeletal:        General: No tenderness.     Cervical back: Normal range of motion and neck supple.  Skin:    General: Skin is warm and dry.  Neurological:     Mental Status: She is alert and oriented to person, place, and time.  Psychiatric:        Behavior: Behavior normal.    ED Results / Procedures / Treatments   Labs (all labs ordered are listed, but  only abnormal results are displayed) Labs Reviewed  BASIC METABOLIC PANEL - Abnormal; Notable for the following components:      Result Value   Glucose, Bld 154 (*)    Creatinine, Ser 1.26 (*)    GFR, Estimated 47 (*)    All other components within normal limits  URINALYSIS, ROUTINE W REFLEX MICROSCOPIC - Abnormal; Notable for the following components:   Color, Urine AMBER (*)    Specific Gravity, Urine 1.035 (*)    Bilirubin Urine SMALL (*)    Ketones, ur 5 (*)    Protein, ur 30 (*)    Bacteria, UA RARE (*)    All other components within normal limits  CBC WITH DIFFERENTIAL/PLATELET  CBG MONITORING, ED    EKG EKG Interpretation  Date/Time:  Wednesday January 15 2022 12:21:46 EDT Ventricular Rate:  57 PR Interval:  132 QRS Duration: 88 QT Interval:  456 QTC Calculation: 444 R Axis:   22 Text Interpretation: Sinus rhythm Abnormal R-wave progression, early transition No significant change since last tracing Confirmed by Deno Etienne [phone]) on 01/15/2022 12:49:05 PM  Radiology CT Head Wo Contrast  Result Date: 01/15/2022 CLINICAL DATA:  Head trauma EXAM: CT HEAD WITHOUT CONTRAST TECHNIQUE: Contiguous axial images were obtained from the base of the skull through the vertex without intravenous contrast. RADIATION DOSE REDUCTION: This exam was performed according to the departmental dose-optimization program which includes automated exposure control, adjustment of the mA and/or kV according to patient size and/or use of iterative reconstruction technique. COMPARISON:  CT head 12/27/2021 FINDINGS: Brain: No acute intracranial hemorrhage, mass effect, or herniation. No extra-axial fluid collections. No evidence of acute territorial infarct. No hydrocephalus. Patchy hypodensities in the periventricular and subcortical white matter, likely secondary to chronic microvascular ischemic changes. Vascular: No hyperdense vessel or unexpected calcification. Skull: Normal. Negative for fracture or focal  lesion. Sinuses/Orbits: No acute finding. Other: None. IMPRESSION: Chronic changes with no acute intracranial process identified. Electronically Signed   By: Ofilia Neas M.D.   On: 01/15/2022 11:42   DG Chest Port 1 View  Result Date: 01/15/2022 CLINICAL DATA:  Chest pain after falling. Dizziness over the last 2 weeks. EXAM: PORTABLE CHEST 1 VIEW COMPARISON:  12/28/2021 FINDINGS: Heart size is normal. Mediastinal shadows are normal. The pulmonary vascularity is normal. The lungs are clear. No effusions. No regional fracture seen. Neuro stimulators in place in the thoracic and cervical region. IMPRESSION: No active disease. Electronically Signed   By: Nelson Chimes M.D.   On: 01/15/2022 11:48  Procedures Procedures    Medications Ordered in ED Medications  ketorolac (TORADOL) 15 MG/ML injection 15 mg (has no administration in time range)  sodium chloride 0.9 % bolus 1,000 mL (1,000 mLs Intravenous New Bag/Given 01/15/22 1215)    ED Course/ Medical Decision Making/ A&P                           Medical Decision Making Amount and/or Complexity of Data Reviewed Labs: ordered. Radiology: ordered.  Risk Prescription drug management.   66 yo  F with a chief complaints of fall.  This occurred a couple weeks ago.  She was seen in the hospital and was admitted for a open pinky dislocation.  She was seen by her pain management doctor last night and there was some concern for confusion and she was directed to come here.  She did upcoming this morning.  She seems much more lucid than she was yesterday evening.  She has trouble figuring out some things that happened to her recently.  We will obtain a CT of the head to assess for delayed intracranial hemorrhage.  Screen for infection with a chest x-ray and UA.  Bolus of IV fluids.  Patient was hypotensive initially.  Repeat blood pressure without any intervention was 120/80.  No anemia no leukocytosis UA independently turbid by me without  infection.  Chest x-ray independently interpreted by me without focal infiltrate.  CT scan of the head without intracranial hemorrhage.  I discussed results with the patient.  I told her I was concerned that polypharmacy might be the most likely cause of her symptoms.  I recommended discussing with her family doctor for possible medication review and potentially physical therapy.  1:47 PM:  I have discussed the diagnosis/risks/treatment options with the patient and family.  Evaluation and diagnostic testing in the emergency department does not suggest an emergent condition requiring admission or immediate intervention beyond what has been performed at this time.  They will follow up with  PCP. We also discussed returning to the ED immediately if new or worsening sx occur. We discussed the sx which are most concerning (e.g., sudden worsening pain, fever, inability to tolerate by mouth) that necessitate immediate return. Medications administered to the patient during their visit and any new prescriptions provided to the patient are listed below.  Medications given during this visit Medications  ketorolac (TORADOL) 15 MG/ML injection 15 mg (has no administration in time range)  sodium chloride 0.9 % bolus 1,000 mL (1,000 mLs Intravenous New Bag/Given 01/15/22 1215)     The patient appears reasonably screen and/or stabilized for discharge and I doubt any other medical condition or other [facility] requiring further screening, evaluation, or treatment in the ED at this time prior to discharge.            Final Clinical Impression(s) / ED Diagnoses Final diagnoses:  Fall, initial encounter    Rx / DC Orders ED Discharge Orders     None         Deno Etienne, DO 01/15/22 1347

## 2022-01-15 NOTE — Discharge Instructions (Signed)
You looked a little dehydrated on your blood work today.  Please eat and drink as well as you can for the next 48 hours and then follow-up with your family doctor in the office.  Hopefully they can see you within a week or 2 and can recheck your blood work for you.  Please return for worsening weakness worsening confusion worsening headache.

## 2022-01-15 NOTE — ED Provider Triage Note (Signed)
Emergency Medicine Provider Triage Evaluation Note  Julie Neal , a 66 y.o. female  was evaluated in triage.  Pt complains of recurrent falls.  Patient was here on 5/19 after a fall.  She was admitted and worked up for syncope and discharged on 5/21.  Says that she has fallen multiple times but she is unable to state if she has fallen since she was in the hospital.  When asked if she has any injuries, she reports the ones that were present on 5/19.  Review of Systems  Positive: Falls Negative: Dizziness  Physical Exam  BP (!) 93/59 (BP Location: Right Arm)   Pulse 65   Temp 98.2 F (36.8 C) (Oral)   Resp 16   SpO2 98%  Gen:   Awake, no distress   Resp:  Normal effort  MSK:   Moves extremities without difficulty  Other:  Moving all extremities  Medical Decision Making  Medically screening exam initiated at 10:40 AM.  Appropriate orders placed.  COY VANDOREN was informed that the remainder of the evaluation will be completed by another provider, this initial triage assessment does not replace that evaluation, and the importance of remaining in the ED until their evaluation is complete.    We will pursue syncope work-up, potentially needs MRI.  CT was negative a few weeks ago.  I am unsure whether or not she has fallen since then, CT scan ordered for now Noted to be mildly hypotensive in triage   Rhae Hammock, PA-C 01/15/22 1043

## 2022-01-15 NOTE — Telephone Encounter (Signed)
1st no show, fee waived ?

## 2022-01-15 NOTE — ED Triage Notes (Signed)
Pt. Stated, Im here cause im falling. I have fallen mor than 5 times.

## 2022-01-22 ENCOUNTER — Telehealth: Payer: Self-pay

## 2022-01-22 NOTE — Progress Notes (Cosign Needed)
Chronic Care Management APPOINTMENT REMINDER  Julie Neal was reminded to have all medications, supplements and any blood glucose and blood pressure readings available for review with Junius Argyle, Pharm. D, at her telephone visit on 01/23/2022 at 3:45 pm.  Patient confirm appointment.   [place] Pharmacist Assistant [phone]

## 2022-01-23 ENCOUNTER — Telehealth: Payer: Medicare Other

## 2022-01-23 NOTE — Progress Notes (Incomplete)
Chronic Care Management Pharmacy Note  01/23/2022 Name:  Julie Neal MRN:  [id number] DOB:  [birth year]  Summary: Patient presents for Initial CCM Consult. 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 She was interested in counseling.   Recommendations/Changes made from today's visit: Provided information for [place].   Plan: CPP follow-up 6 months   Subjective: Julie Neal is an 66 y.o. year old female who is a primary patient of Nche, Charlene Brooke, NP.  The CCM team was consulted for assistance with disease management and care coordination needs.    Engaged with patient by telephone for follow up visit in response to provider referral for pharmacy case management and/or care coordination services.   Consent to Services:  The patient was given information about Chronic Care Management services, agreed to services, and gave verbal consent prior to initiation of services.  Please see initial visit note for detailed documentation.   Patient Care Team: Nche, Charlene Brooke, NP as PCP - General (Internal Medicine) Melina Schools, MD as Referring Physician (Orthopedic Surgery) Germaine Pomfret, [facility] as Pharmacist (Pharmacist)  Recent office visits:  Recent consult visits:   Hospital visits: 5/19-5/21: Patient hospitalized for syncope following fall. Bupropion stopped.    Objective:  Lab Results  Component Value Date   CREATININE 1.26 (H) 01/15/2022   BUN 15 01/15/2022   GFR 50.87 (L) 06/26/2021   GFRNONAA 47 (L) 01/15/2022   GFRAA 72 09/22/2019   NA 138 01/15/2022   K 3.9 01/15/2022   CALCIUM 9.3 01/15/2022   CO2 27 01/15/2022   GLUCOSE 154 (H) 01/15/2022    Lab Results  Component Value Date/Time   HGBA1C 6.4 06/26/2021 10:11 AM   HGBA1C 6.1 (A) 01/29/2021 08:51 AM   HGBA1C 5.4 09/22/2019 12:17 PM   GFR 50.87 (L) 06/26/2021 10:11 AM   GFR 61.31 12/24/2020 09:53 AM    Last diabetic Eye exam: No results found  for: "HMDIABEYEEXA"  Last diabetic Foot exam: No results found for: "HMDIABFOOTEX"   Lab Results  Component Value Date   CHOL 160 12/24/2020   HDL 47.00 12/24/2020   LDLCALC 83 12/24/2020   TRIG 149.0 12/24/2020   CHOLHDL 3 12/24/2020       Latest Ref Rng & Units 12/28/2021    6:17 AM 06/26/2021   10:11 AM 12/24/2020    9:53 AM  Hepatic Function  Total Protein 6.5 - 8.1 g/dL 7.2  7.5  6.9   Albumin 3.5 - 5.0 g/dL 3.8  4.6  4.3   AST 15 - 41 U/L 43  32  20   ALT 0 - 44 U/L 31  32  13   Alk Phosphatase 38 - 126 U/L 43  59  52   Total Bilirubin 0.3 - 1.2 mg/dL 0.8  0.5  0.5   Bilirubin, Direct 0.0 - 0.3 mg/dL   0.1     Lab Results  Component Value Date/Time   TSH 1.04 06/26/2021 10:11 AM   TSH 0.94 06/01/2017 10:02 AM       Latest Ref Rng & Units 01/15/2022   10:45 AM 12/29/2021    1:58 AM 12/28/2021    6:17 AM  CBC  WBC 4.0 - 10.5 K/uL 5.0  5.6  9.3   Hemoglobin 12.0 - 15.0 g/dL 12.2  11.8  12.6   Hematocrit 36.0 - 46.0 % 36.1  35.0  37.5   Platelets 150 - 400 K/uL 206  176  182  No results found for: "VD25OH"  Clinical ASCVD: No  The 10-year ASCVD risk score (Arnett DK, et al., 2019) is: 5%   Values used to calculate the score:     Age: 88 years     Sex: Female     Is Non-Hispanic African American: No     Diabetic: No     Tobacco smoker: No     Systolic Blood Pressure: 917 mmHg     Is BP treated: No     HDL Cholesterol: 47 mg/dL     Total Cholesterol: 160 mg/dL       06/26/2021   10:14 AM 12/24/2020   10:06 AM 11/20/2020    9:51 AM  Depression screen PHQ 2/9  Decreased Interest 0 0 0  Down, Depressed, Hopeless 1 0 0  PHQ - 2 Score 1 0 0  Altered sleeping 0 0   Tired, decreased energy 0 0   Change in appetite 2 0   Feeling bad or failure about yourself  0 0   Trouble concentrating 1 0   Moving slowly or fidgety/restless 0 3   Suicidal thoughts 0 0   PHQ-9 Score 4 3   Difficult doing work/chores Somewhat difficult Somewhat difficult     Social  History   Tobacco Use  Smoking Status Former   Types: Cigarettes  Smokeless Tobacco Never  Tobacco Comments   Quit in 1988   BP Readings from Last 3 Encounters:  01/15/22 (!) 117/104  12/29/21 121/88  06/26/21 118/64   Pulse Readings from Last 3 Encounters:  01/15/22 61  12/29/21 88  06/26/21 88   Wt Readings from Last 3 Encounters:  12/28/21 168 lb 4.8 oz (76.3 kg)  06/26/21 174 lb (78.9 kg)  02/22/21 170 lb 12.8 oz (77.5 kg)   BMI Readings from Last 3 Encounters:  12/28/21 26.36 kg/m  06/26/21 28.08 kg/m  02/22/21 27.57 kg/m    Assessment/Interventions: Review of patient past medical history, allergies, medications, health status, including review of consultants reports, laboratory and other test data, was performed as part of comprehensive evaluation and provision of chronic care management services.   SDOH:  (Social Determinants of Health) assessments and interventions performed: Yes   SDOH Screenings   Alcohol Screen: Low Risk  (11/20/2020)   Alcohol Screen    Last Alcohol Screening Score (AUDIT): 0  Depression (PHQ2-9): Low Risk  (06/26/2021)   Depression (PHQ2-9)    PHQ-2 Score: 4  Financial Resource Strain: Low Risk  (05/10/2021)   Overall Financial Resource Strain (CARDIA)    Difficulty of Paying Living Expenses: Not hard at all  Food Insecurity: No Food Insecurity (11/20/2020)   Hunger Vital Sign    Worried About Running Out of Food in the Last Year: Never true    Ran Out of Food in the Last Year: Never true  Housing: Low Risk  (11/20/2020)   Housing    Last Housing Risk Score: 0  Physical Activity: Sufficiently Active (11/20/2020)   Exercise Vital Sign    Days of Exercise per Week: 7 days    Minutes of Exercise per Session: 30 min  Social Connections: Moderately Isolated (11/20/2020)   Social Connection and Isolation Panel [NHANES]    Frequency of Communication with Friends and Family: More than three times a week    Frequency of Social Gatherings  with Friends and Family: More than three times a week    Attends Religious Services: More than 4 times per year    Active Member  of Clubs or Organizations: No    Attends Archivist Meetings: Never    Marital Status: Widowed  Stress: No Stress Concern Present (11/20/2020)   [place]    Feeling of Stress : Not at all  Tobacco Use: Medium Risk (01/15/2022)   Patient History    Smoking Tobacco Use: Former    Smokeless Tobacco Use: Never    Passive Exposure: Not on file  Transportation Needs: No Transportation Needs (11/20/2020)   PRAPARE - Hydrologist (Medical): No    Lack of Transportation (Non-Medical): No    CCM Care Plan  Allergies  Allergen Reactions   Mobic [Meloxicam] Anaphylaxis   Penicillins Anaphylaxis   Nubain [Nalbuphine Hcl] Diarrhea and Nausea And Vomiting    Medications Reviewed Today     Reviewed by Payton Doughty, CPhT (Pharmacy Technician) on 12/29/21 at [place] List Status: F/U - See Status Comment   Medication Order Taking? Sig Documenting Provider Last Dose Status Informant  atorvastatin (LIPITOR) 40 MG tablet [id number] Yes Take 1 tablet (40 mg total) by mouth daily. Flossie Buffy, NP 12/27/2021 Active   buPROPion (WELLBUTRIN XL) 300 MG 24 hr tablet [id number]  Take 1 tablet (300 mg total) by mouth daily. Flossie Buffy, NP  Active   CALCIUM CITRATE PO [id number]  Take by mouth 2 (two) times daily. [provider]  Active   Cholecalciferol (VITAMIN D3 PO) [id number]  Take by mouth daily. [provider]  Active   diazepam (VALIUM) 5 MG tablet [id number]  TAKE 1 TABLET BY MOUTH EVERY 12 HOURS AS NEEDED FOR MUSCLE SPASM (ESOPHAGEAL  SPASM) Nche, Charlene Brooke, NP  Active   DULoxetine (CYMBALTA) 60 MG capsule [id number]  Take 1 capsule (60 mg total) by mouth daily. Nche, Charlene Brooke, NP  Active   minocycline (MINOCIN) 50 MG capsule  [id number]  Take 1 capsule by mouth twice daily Lavonna Monarch, MD  Active   mupirocin ointment (BACTROBAN) 2 % [id number]  Apply 1 application topically 2 (two) times daily. x7day Flossie Buffy, NP  Active   naloxone [facility]) nasal spray 4 mg/0.1 mL [id number]  SMARTSIG:Spray(s) In Nostril [provider]  Active   oxyCODONE-acetaminophen (PERCOCET) 10-325 MG tablet [id number]  Take 1 tablet by mouth every 6 (six) hours as needed. [provider]  Active   promethazine (PHENERGAN) 25 MG tablet [id number]  Take 1 tablet (25 mg total) by mouth every 8 (eight) hours as needed for nausea or vomiting. Nche, Charlene Brooke, NP  Active   SUMAtriptan (IMITREX) 50 MG tablet [id number]  Take 1 tablet (50 mg total) by mouth once as needed for up to 1 dose for migraine (may repeat x 1 tab in 1-2hrs if headache persists). May repeat in 2 hours if headache persists or recurs. Nche, Charlene Brooke, NP  Active   tiZANidine (ZANAFLEX) 4 MG tablet [id number]  TAKE 1 TABLET BY MOUTH 4 TIMES A DAY AS NEEDED [provider]  Active   traZODone (DESYREL) 100 MG tablet [id number]  TAKE 2 TABLETS BY MOUTH IN THE EVENING AT BEDTIME Nche, Charlene Brooke, NP  Active             Patient Active Problem List   Diagnosis Date Noted   Open dislocation of proximal interphalangeal (PIP) joint of left little finger 12/28/2021   Major depressive disorder 12/28/2021   Fever 12/28/2021   Abnormal rhythmic movement of tongue  12/28/2021   Syncope 12/27/2021   At high risk for falls 06/26/2021   Hyperglycemia 01/29/2021   Esophageal spasm 01/29/2021   Bilateral low back pain with sciatica 11/05/2020   Gait abnormality 10/04/2020   Paresthesia 10/04/2020   Leg length inequality 02/16/2020   Scoliosis deformity of spine 02/16/2020   Finger pain, left 06/08/2017   Asymptomatic stenosis of right carotid artery    Fracture of ankle, bimalleolar, left, closed 08/10/2014   Shaking 01/12/2013   Anxiety     Memory loss    Lumbago    Fibromyalgia    Migraines    Mixed hyperlipidemia    Depression, recurrent (HCC)    Neuralgic migraines    PULMONARY NODULE 07/25/2010   H/O migraine 01/05/2007    Immunization History  Administered Date(s) Administered   Fluad Quad(high Dose 65+) 06/26/2020, 06/26/2021   Influenza, High Dose Seasonal PF 05/31/2018   Influenza,inj,Quad PF,6+ Mos 07/18/2014, 07/23/2015, 05/23/2016, 05/15/2017   Influenza-Unspecified 05/12/2019   PFIZER(Purple Top)SARS-COV-2 Vaccination 11/10/2019, 12/05/2019, 06/11/2020   Pneumococcal Conjugate-13 06/26/2021   Tdap 07/18/2014, 05/16/2016, 12/27/2021   Zoster Recombinat (Shingrix) 10/16/2020, 03/26/2021    Conditions to be addressed/monitored:  Hyperlipidemia, Depression, and Anxiety  There are no care plans that you recently modified to display for this patient.     Medication Assistance: None required.  Patient affirms current coverage meets needs.  Compliance/Adherence/Medication fill history: Care Gaps: Shingrix Vaccine PNA Vaccine COVID-19 Vaccine Influenza Vaccine  Star-Rating Drugs: Atorvastatin 40 mg last filled 03/12/2021 for 90 day supply at [facility].  Patient's preferred pharmacy is:  [facility] ([facility]), [place] - [address] [place] [address] Phone: [phone] Fax: [phone]  [id number] [place] [address] [id number]: [phone]: [phone]  Uses pill box? Yes Pt endorses 100% compliance  We discussed: Benefits of medication synchronization, packaging and delivery as well as enhanced pharmacist oversight with [employer]. Patient decided to: Continue current medication management strategy  Care Plan and Follow Up Patient Decision:  Patient agrees to Care Plan and Follow-up.  Plan: Telephone follow up appointment  with care management team member scheduled for:  10/14/20 at 11:00 AM  AJunius Argyle PharmD, BPara March CPP Clinical Pharmacist [facility] [phone] Current Barriers:  Unable to maintain control of depression  Pharmacist Clinical Goal(s):  Patient will maintain control of depression, anxiety as evidenced by patient report  through collaboration with PharmD and provider.   Interventions: 1:1 collaboration with Nche, CCharlene Brooke NP regarding development and update of comprehensive plan of care as evidenced by provider attestation and co-signature Inter-disciplinary care team collaboration (see longitudinal plan of care) Comprehensive medication review performed; medication list updated in electronic medical record  Hyperlipidemia: (LDL goal < 100) -Controlled -Current treatment: Atorvastatin 40 mg daily   -Medications previously tried: NA  -Current dietary patterns: Limits food in eveining, limits red meat, fatty foods. Soft foods. Boost, water, rarelr Root Beer.  -Current exercise habits: Tries to walk when possible, limited by pain   -Educated on Importance of limiting foods high in cholesterol; -Recommended to continue current medication  Depression/Anxiety (Goal: Maintain disease remission) -Controlled -Current treatment Diazepam 5 mg twice daily as needed  Duloxetine 30 mg daily  Trazodone 100 mg 2 tablets nightly -Medications previously tried/failed: Bupropion -PHQ9: 4 -GAD7: 6 -Patient previously worked with psychiatry, not interested  in retrying. She was interested in counseling. Provided information for [place].  -Recommended to continue current medication  Osteopenia (Goal Prevent bone fractures) -Controlled -Last DEXA Scan: 12/09/19   T-Score femoral neck: -1.8  T-Score total hip: -1.7  T-Score lumbar spine: -2.4  T-Score forearm radius: NA  10-year probability of major osteoporotic fracture: 15.3%  10-year  probability of hip fracture: 2.1% -Patient is not a candidate for pharmacologic treatment -Current treatment  Calcium Citrate twice daily  Vitamin D3 daily  -Medications previously tried: Alendronate (2017-2022)  -Recommend 1200 mg of calcium daily from dietary and supplemental sources. -Recommended to continue current medication  Chronic Pain (Goal: Achieve adequate pain control) -Not ideally controlled -Current treatment  Tizanidine 4 mg four times daily as needed  Oxycodone-APAP 10-325 mg q6hr PRN  -Medications previously tried: NA  -Recommended to continue current medication  Patient Goals/Self-Care Activities Patient will:  - take medications as prescribed  Follow Up Plan: Telephone follow up appointment with care management team member scheduled for:  10/14/20 at 11:00 AM

## 2022-01-24 ENCOUNTER — Other Ambulatory Visit: Payer: Self-pay | Admitting: Nurse Practitioner

## 2022-01-24 DIAGNOSIS — K224 Dyskinesia of esophagus: Secondary | ICD-10-CM

## 2022-01-29 ENCOUNTER — Encounter: Payer: Self-pay | Admitting: Nurse Practitioner

## 2022-01-29 ENCOUNTER — Ambulatory Visit (INDEPENDENT_AMBULATORY_CARE_PROVIDER_SITE_OTHER): Payer: Medicare Other | Admitting: Nurse Practitioner

## 2022-01-29 VITALS — BP 116/86 | HR 84 | Temp 97.3°F | Ht 67.0 in | Wt 160.8 lb

## 2022-01-29 DIAGNOSIS — E1165 Type 2 diabetes mellitus with hyperglycemia: Secondary | ICD-10-CM

## 2022-01-29 DIAGNOSIS — R55 Syncope and collapse: Secondary | ICD-10-CM

## 2022-01-29 DIAGNOSIS — F3342 Major depressive disorder, recurrent, in full remission: Secondary | ICD-10-CM | POA: Diagnosis not present

## 2022-01-29 DIAGNOSIS — K224 Dyskinesia of esophagus: Secondary | ICD-10-CM

## 2022-01-29 LAB — POCT GLYCOSYLATED HEMOGLOBIN (HGB A1C): Hemoglobin A1C: 6.6 % — AB (ref 4.0–5.6)

## 2022-01-29 MED ORDER — BLOOD GLUCOSE METER KIT: PACK | 0 refills | Status: AC

## 2022-01-29 NOTE — Patient Instructions (Addendum)
With your current supply of valium 60m: take 1/2tab BID till 02/14/2022. Your next prescription is due 02/13/2022: 255mBID F/up in 13m2monthDiabetes Mellitus and Nutrition, Adult When you have diabetes, or diabetes mellitus, it is very important to have healthy eating habits because your blood sugar (glucose) levels are greatly affected by what you eat and drink. Eating healthy foods in the right amounts, at about the same times every day, can help you: Manage your blood glucose. Lower your risk of heart disease. Improve your blood pressure. Reach or maintain a healthy weight. What can affect my meal plan? Every person with diabetes is different, and each person has different needs for a meal plan. Your health care provider may recommend that you work with a dietitian to make a meal plan that is best for you. Your meal plan may vary depending on factors such as: The calories you need. The medicines you take. Your weight. Your blood glucose, blood pressure, and cholesterol levels. Your activity level. Other health conditions you have, such as heart or kidney disease. How do carbohydrates affect me? Carbohydrates, also called carbs, affect your blood glucose level more than any other type of food. Eating carbs raises the amount of glucose in your blood. It is important to know how many carbs you can safely have in each meal. This is different for every person. Your dietitian can help you calculate how many carbs you should have at each meal and for each snack. How does alcohol affect me? Alcohol can cause a decrease in blood glucose (hypoglycemia), especially if you use insulin or take certain diabetes medicines by mouth. Hypoglycemia can be a life-threatening condition. Symptoms of hypoglycemia, such as sleepiness, dizziness, and confusion, are similar to symptoms of having too much alcohol. Do not drink alcohol if: Your health care provider tells you not to drink. You are pregnant, may be  pregnant, or are planning to become pregnant. If you drink alcohol: Limit how much you have to: 0-1 drink a day for women. 0-2 drinks a day for men. Know how much alcohol is in your drink. In the [place] one drink equals one 12 oz bottle of beer (355 mL), one 5 oz glass of wine (148 mL), or one 1 oz glass of hard liquor (44 mL). Keep yourself hydrated with water, diet soda, or unsweetened iced tea. Keep in mind that regular soda, juice, and other mixers may contain a lot of sugar and must be counted as carbs. What are tips for following this plan?  Reading food labels Start by checking the serving size on the Nutrition Facts label of packaged foods and drinks. The number of calories and the amount of carbs, fats, and other nutrients listed on the label are based on one serving of the item. Many items contain more than one serving per package. Check the total grams (g) of carbs in one serving. Check the number of grams of saturated fats and trans fats in one serving. Choose foods that have a low amount or none of these fats. Check the number of milligrams (mg) of salt (sodium) in one serving. Most people should limit total sodium intake to less than 2,300 mg per day. Always check the nutrition information of foods labeled as "low-fat" or "nonfat." These foods may be higher in added sugar or refined carbs and should be avoided. Talk to your dietitian to identify your daily goals for nutrients listed on the label. Shopping Avoid buying canned, pre-made, or processed foods. These  foods tend to be high in fat, sodium, and added sugar. Shop around the outside edge of the grocery store. This is where you will most often find fresh fruits and vegetables, bulk grains, fresh meats, and fresh dairy products. Cooking Use low-heat cooking methods, such as baking, instead of high-heat cooking methods, such as deep frying. Cook using healthy oils, such as olive, canola, or sunflower oil. Avoid cooking with  butter, cream, or high-fat meats. Meal planning Eat meals and snacks regularly, preferably at the same times every day. Avoid going long periods of time without eating. Eat foods that are high in fiber, such as fresh fruits, vegetables, beans, and whole grains. Eat 4-6 oz (112-168 g) of lean protein each day, such as lean meat, chicken, fish, eggs, or tofu. One ounce (oz) (28 g) of lean protein is equal to: 1 oz (28 g) of meat, chicken, or fish. 1 egg.  cup (62 g) of tofu. Eat some foods each day that contain healthy fats, such as avocado, nuts, seeds, and fish. What foods should I eat? Fruits Berries. Apples. Oranges. Peaches. Apricots. Plums. Grapes. Mangoes. Papayas. Pomegranates. Kiwi. Cherries. Vegetables Leafy greens, including lettuce, spinach, kale, chard, collard greens, mustard greens, and cabbage. Beets. Cauliflower. Broccoli. Carrots. Green beans. Tomatoes. Peppers. Onions. Cucumbers. Brussels sprouts. Grains Whole grains, such as whole-wheat or whole-grain bread, crackers, tortillas, cereal, and pasta. Unsweetened oatmeal. Quinoa. Brown or wild rice. Meats and other proteins Seafood. Poultry without skin. Lean cuts of poultry and beef. Tofu. Nuts. Seeds. Dairy Low-fat or fat-free dairy products such as milk, yogurt, and cheese. The items listed above may not be a complete list of foods and beverages you can eat and drink. Contact a dietitian for more information. What foods should I avoid? Fruits Fruits canned with syrup. Vegetables Canned vegetables. Frozen vegetables with butter or cream sauce. Grains Refined white flour and flour products such as bread, pasta, snack foods, and cereals. Avoid all processed foods. Meats and other proteins Fatty cuts of meat. Poultry with skin. Breaded or fried meats. Processed meat. Avoid saturated fats. Dairy Full-fat yogurt, cheese, or milk. Beverages Sweetened drinks, such as soda or iced tea. The items listed above may not be a  complete list of foods and beverages you should avoid. Contact a dietitian for more information. Questions to ask a health care provider Do I need to meet with a certified diabetes care and education specialist? Do I need to meet with a dietitian? What number can I call if I have questions? When are the best times to check my blood glucose? Where to find more information: American Diabetes Association: diabetes.org Academy of Nutrition and Dietetics: eatright.[employer] of Diabetes and Digestive and Kidney Diseases: AmenCredit.is Association of Diabetes Care & Education Specialists: diabeteseducator.org Summary It is important to have healthy eating habits because your blood sugar (glucose) levels are greatly affected by what you eat and drink. It is important to use alcohol carefully. A healthy meal plan will help you manage your blood glucose and lower your risk of heart disease. Your health care provider may recommend that you work with a dietitian to make a meal plan that is best for you. This information is not intended to replace advice given to you by your health care provider. Make sure you discuss any questions you have with your health care provider. Document Revised: 02/29/2020 Document Reviewed: 02/29/2020 Elsevier Patient Education  [place] for Falls Each year, millions of people  have serious injuries from falls. It is important to understand your risk for falling. Talk with your health care provider about your risk and what you can do to lower it. There are actions you can take at home to lower your risk and prevent falls. If you do have a serious fall, make sure to tell your health care provider. Falling once raises your risk of falling again. How can falls affect me? Serious injuries from falls are common. These include: Broken bones, such as hip fractures. Head injuries, such as traumatic brain injuries (TBI) or  concussion. A fear of falling can cause you to avoid activities and stay at home. This can make your muscles weaker and actually raise your risk for a fall. What can increase my risk? There are a number of risk factors that increase your risk for falling. The more risk factors you have, the higher your risk of falling. Serious injuries from a fall happen most often to people older than age 33. Children and young adults ages 34-29 are also at higher risk. Common risk factors include: Weakness in the lower body. Lack (deficiency) of vitamin D. Being generally weak or confused due to long-term (chronic) illness. Dizziness or balance problems. Poor vision. Medicines that cause dizziness or drowsiness. These can include medicines for your blood pressure, heart, anxiety, insomnia, or edema, as well as pain medicines and muscle relaxants. Other risk factors include: Drinking alcohol. Having had a fall in the past. Having depression. Having foot pain or wearing improper footwear. Working at a dangerous job. Having any of the following in your home: Tripping hazards, such as floor clutter or loose rugs. Poor lighting. Pets. Having dementia or memory loss. What actions can I take to lower my risk of falling?     Physical activity Maintain physical fitness. Do strength and balance exercises. Consider taking a regular class to build strength and balance. Yoga and tai chi are good options. Vision Have your eyes checked every year and your vision prescription updated as needed. Walking aids and footwear Wear nonskid shoes. Do not wear high heels. Do not walk around the house in socks or slippers. Use a cane or walker as told by your health care provider. Home safety Attach secure railings on both sides of your stairs. Install grab bars for your tub, shower, and toilet. Use a bath mat in your tub or shower. Use good lighting in all rooms. Keep a flashlight near your bed. Make sure there is a  clear path from your bed to the bathroom. Use night-lights. Do not use throw rugs. Make sure all carpeting is taped or tacked down securely. Remove all clutter from walkways and stairways, including extension cords. Repair uneven or broken steps. Avoid walking on icy or slippery surfaces. Walk on the grass instead of on icy or slick sidewalks. Use ice melt to get rid of ice on walkways. Use a cordless phone. Questions to ask your health care provider Can you help me check my risk for a fall? Do any of my medicines make me more likely to fall? Should I take a vitamin D supplement? What exercises can I do to improve my strength and balance? Should I make an appointment to have my vision checked? Do I need a bone density test to check for weak bones or osteoporosis? Would it help to use a cane or a walker? Where to find more information Centers for Disease Control and Prevention, STEADI: http://www.wolf.info/ Community-Based Fall Prevention Programs: http://www.wolf.info/ National Institute  on Aging: http://kim-miller.com/ Contact a health care provider if: You fall at home. You are afraid of falling at home. You feel weak, drowsy, or dizzy. Summary Serious injuries from a fall happen most often to people older than age 35. Children and young adults ages 51-29 are also at higher risk. Talk with your health care provider about your risks for falling and how to lower those risks. Taking certain precautions at home can lower your risk for falling. If you fall, always tell your health care provider. This information is not intended to replace advice given to you by your health care provider. Make sure you discuss any questions you have with your health care provider. Document Revised: 02/29/2020 Document Reviewed: 02/29/2020 Elsevier Patient Education  [place].

## 2022-01-29 NOTE — Assessment & Plan Note (Signed)
Stable mood with cymbalta and wellbutrin. Maintain med doses

## 2022-01-29 NOTE — Assessment & Plan Note (Signed)
LOC <16mn, fell backwards off 2 steps, her sister and brother in law were close by but did not see how fall happened. They heard the fall. She called for help immediately she fell, she does not remember how fall happened. Trauma to head, face, finger and knee.  Denies any syncopal episode since 12/27/2021. She had 2 falls since hospitalization because she was not using walker in her home and tripped on her dog. Reviewed labs and radiology report from hospital stay 05/19-05/21/2023 and ED visit 01/15/2022. Normal EEG Normal echocardiogram  Possibly due to orthostatic hypotension vs polypharmacy: cymbalta, wellbutrin, trazodone, oxycodone, and valium. We discussed need to decrease valium dose. She agreed to decrease to to '2mg'$  BID x 167monththen '2mg'$  daily Advised to change positions slowly and use walker at all times F/up in 59m54month

## 2022-01-29 NOTE — Assessment & Plan Note (Signed)
With recent frequent falls and syncopal episode, she agreed to decrease valium dose.  With your current supply of valium '5mg'$ x15: take 1/2tab BID till 02/14/2022. Your next prescription is due 02/13/2022: '2mg'$  BID F/up in 50month

## 2022-01-29 NOTE — Assessment & Plan Note (Addendum)
Repeat hgbA1c: 6.6% New diagnosis Fhx of DM-type 2: sister, brother, both parents and grandparents. She is familiar with DM disease process. And possible complications We discussed necessary dietary modifications. She agreed to nutritionist referral and glucometer rx. Provided printed information  F/up in 38month

## 2022-01-29 NOTE — Progress Notes (Signed)
Established Patient Visit  Patient: Julie Neal   DOB: 14-Feb-1956   66 y.o. Female  MRN: [id number] Visit Date: 01/29/2022  Subjective:    Chief Complaint  Patient presents with   [facility] F/u from 01/15/2022 due to a fall. No concerns today      Accompanied by Sister: [name]  HPI Major depressive disorder Stable mood with cymbalta and wellbutrin. Maintain med doses  DM (diabetes mellitus) ([place]) Repeat hgbA1c: 6.6% New diagnosis Fhx of DM-type 2: sister, brother, both parents and grandparents. She is familiar with DM disease process. And possible complications We discussed necessary dietary modifications. She agreed to nutritionist referral and glucometer rx. Provided printed information  F/up in 65month  Syncope LOC <120m, fell backwards off 2 steps, her sister and brother in law were close by but did not see how fall happened. They heard the fall. She called for help immediately she fell, she does not remember how fall happened. Trauma to head, face, finger and knee.  Denies any syncopal episode since 12/27/2021. She had 2 falls since hospitalization because she was not using walker in her home and tripped on her dog. Reviewed labs and radiology report from hospital stay 05/19-05/21/2023 and ED visit 01/15/2022. Normal EEG Normal echocardiogram  Possibly due to orthostatic hypotension vs polypharmacy: cymbalta, wellbutrin, trazodone, oxycodone, and valium. We discussed need to decrease valium dose. She agreed to decrease to to 77m91mID x 29mo56monthen 77mg 46mly Advised to change positions slowly and use walker at all times F/up in 29mont46monthhageal spasm With recent frequent falls and syncopal episode, she agreed to decrease valium dose.  With your current supply of valium 5mgx1555make 1/2tab BID till 02/14/2022. Your next prescription is due 02/13/2022: 77mg BID80mup in 29month. 46month1/2023    1:05 PM 06/26/2021   10:14 AM  12/24/2020   10:06 AM  Depression screen PHQ 2/9  Decreased Interest 2 0 0  Down, Depressed, Hopeless 2 1 0  PHQ - 2 Score 4 1 0  Altered sleeping 2 0 0  Tired, decreased energy 1 0 0  Change in appetite 1 2 0  Feeling bad or failure about yourself  0 0 0  Trouble concentrating 0 1 0  Moving slowly or fidgety/restless 0 0 3  Suicidal thoughts 0 0 0  PHQ-9 Score _0 Difficult doing work/chores Not difficult at all Somewhat difficult Somewhat difficult       01/29/2022    1:05 PM 06/26/2021   10:14 AM 12/24/2020   10:07 AM 06/26/2020    9:06 AM  GAD 7 : Generalized Anxiety Score  Nervous, Anxious, on Edge 0 0 1 0  Control/stop worrying 0 2 0 0  Worry too much - different things 0 1 0 0  Trouble relaxing 0 1 0 1  Restless 0 0 1 0  Easily annoyed or irritable 0 2 0 1  Afraid - awful might happen 0 0 0 0  Total GAD 7 Score 0 _1 Anxiety Difficulty Not difficult at all Somewhat difficult Somewhat difficult Not difficult at all   Reviewed medical, surgical, and social history today  Medications: Outpatient Medications Prior to Visit  Medication Sig   atorvastatin (LIPITOR) 40 MG tablet Take 1 tablet (40 mg total) by mouth daily.   buPROPion (WELLBUTRIN XL) 300 MG 24 hr  tablet Take 300 mg by mouth daily.   DULoxetine (CYMBALTA) 60 MG capsule Take 1 capsule (60 mg total) by mouth daily.   mupirocin ointment (BACTROBAN) 2 % Apply 1 application topically 2 (two) times daily. x7day   naloxone (NARCAN) nasal spray 4 mg/0.1 mL SMARTSIG:Spray(s) In Nostril   oxyCODONE-acetaminophen (PERCOCET) 10-325 MG tablet Take 1 tablet by mouth every 6 (six) hours as needed.   promethazine (PHENERGAN) 25 MG tablet Take 1 tablet (25 mg total) by mouth every 8 (eight) hours as needed for nausea or vomiting.   SUMAtriptan (IMITREX) 50 MG tablet Take 1 tablet (50 mg total) by mouth once as needed for up to 1 dose for migraine (may repeat x 1 tab in 1-2hrs if headache persists). May repeat in 2 hours  if headache persists or recurs.   tiZANidine (ZANAFLEX) 4 MG tablet TAKE 1 TABLET BY MOUTH 4 TIMES A DAY AS NEEDED   traZODone (DESYREL) 100 MG tablet TAKE 2 TABLETS BY MOUTH IN THE EVENING AT BEDTIME   [DISCONTINUED] diazepam (VALIUM) 5 MG tablet TAKE 1 TABLET BY MOUTH EVERY 12 HOURS AS NEEDED FOR MUSCLE SPASM (ESOPHAGEAL  SPASM)   CALCIUM CITRATE PO Take by mouth 2 (two) times daily. (Patient not taking: Reported on 01/29/2022)   Cholecalciferol (VITAMIN D3 PO) Take by mouth daily. (Patient not taking: Reported on 01/29/2022)   No facility-administered medications prior to visit.   Reviewed past medical and social history.   ROS per HPI above      Objective:  BP 116/86 (BP Location: Right Arm, Patient Position: Sitting, Cuff Size: Normal)   Pulse 84   Temp (!) 97.3 F (36.3 C) (Temporal)   Ht _0  (1.702 m)   Wt 160 lb 12.8 oz (72.9 kg)   SpO2 95%   BMI 25.18 kg/m      Physical Exam  Results for orders placed or performed in visit on 01/29/22  POCT glycosylated hemoglobin (Hb A1C)  Result Value Ref Range   Hemoglobin A1C 6.6 (A) 4.0 - 5.6 %      Assessment & Plan:    Problem List Items Addressed This Visit       Digestive   Esophageal spasm    With recent frequent falls and syncopal episode, she agreed to decrease valium dose.  With your current supply of valium 19mx15: take 1/2tab BID till 02/14/2022. Your next prescription is due 02/13/2022: 2620mBID F/up in 20m48month      Endocrine   [facility]    Repeat hgbA1c: 6.6% New diagnosis Fhx of DM-type 2: sister, brother, both parents and grandparents. She is familiar with DM disease process. And possible complications We discussed necessary dietary modifications. She agreed to nutritionist referral and glucometer rx. Provided printed information  F/up in 21mo19month   Relevant Medications   blood glucose meter kit and supplies   Other Relevant Orders   POCT glycosylated hemoglobin  (Hb A1C) (Completed)   Referral to Nutrition and Diabetes Services     Other   Major depressive disorder    Stable mood with cymbalta and wellbutrin. Maintain med doses      Relevant Medications   buPROPion (WELLBUTRIN XL) 300 MG 24 hr tablet   Syncope    LOC <20min37mell backwards off 2 steps, her sister and brother in law were close by but did not see how fall happened. They heard the fall. She called for help immediately she fell, she  does not remember how fall happened. Trauma to head, face, finger and knee.  Denies any syncopal episode since 12/27/2021. She had 2 falls since hospitalization because she was not using walker in her home and tripped on her dog. Reviewed labs and radiology report from hospital stay 05/19-05/21/2023 and ED visit 01/15/2022. Normal EEG Normal echocardiogram  Possibly due to orthostatic hypotension vs polypharmacy: cymbalta, wellbutrin, trazodone, oxycodone, and valium. We discussed need to decrease valium dose. She agreed to decrease to to 16m BID x 119monththen 76m58maily Advised to change positions slowly and use walker at all times F/up in 48mo4month  Return in about 4 weeks (around 02/26/2022) for depression, Syncope ([id number]     CharlWilfred Lacy

## 2022-01-30 ENCOUNTER — Other Ambulatory Visit: Payer: Self-pay | Admitting: Nurse Practitioner

## 2022-01-30 DIAGNOSIS — K224 Dyskinesia of esophagus: Secondary | ICD-10-CM

## 2022-02-04 DIAGNOSIS — S63287A Dislocation of proximal interphalangeal joint of left little finger, initial encounter: Secondary | ICD-10-CM | POA: Diagnosis not present

## 2022-02-18 DIAGNOSIS — M62830 Muscle spasm of back: Secondary | ICD-10-CM | POA: Diagnosis not present

## 2022-02-18 DIAGNOSIS — M47812 Spondylosis without myelopathy or radiculopathy, cervical region: Secondary | ICD-10-CM | POA: Diagnosis not present

## 2022-02-18 DIAGNOSIS — M47817 Spondylosis without myelopathy or radiculopathy, lumbosacral region: Secondary | ICD-10-CM | POA: Diagnosis not present

## 2022-02-18 DIAGNOSIS — G894 Chronic pain syndrome: Secondary | ICD-10-CM | POA: Diagnosis not present

## 2022-03-06 ENCOUNTER — Ambulatory Visit (INDEPENDENT_AMBULATORY_CARE_PROVIDER_SITE_OTHER): Payer: Medicare Other | Admitting: Nurse Practitioner

## 2022-03-06 ENCOUNTER — Encounter: Payer: Self-pay | Admitting: Nurse Practitioner

## 2022-03-06 VITALS — BP 126/76 | HR 71 | Temp 96.9°F | Ht 67.0 in | Wt 152.4 lb

## 2022-03-06 DIAGNOSIS — Z9181 History of falling: Secondary | ICD-10-CM

## 2022-03-06 DIAGNOSIS — R269 Unspecified abnormalities of gait and mobility: Secondary | ICD-10-CM | POA: Diagnosis not present

## 2022-03-06 DIAGNOSIS — R531 Weakness: Secondary | ICD-10-CM | POA: Diagnosis not present

## 2022-03-06 DIAGNOSIS — K224 Dyskinesia of esophagus: Secondary | ICD-10-CM

## 2022-03-06 DIAGNOSIS — F3341 Major depressive disorder, recurrent, in partial remission: Secondary | ICD-10-CM | POA: Diagnosis not present

## 2022-03-06 MED ORDER — DIAZEPAM 5 MG PO TABS: 5.0000 mg | ORAL_TABLET | Freq: Every day | ORAL | 1 refills | Status: DC

## 2022-03-06 NOTE — Patient Instructions (Signed)
Maintain current medications. You will be contacted for home PT. Start supervised daily walks outside of your home.

## 2022-03-06 NOTE — Progress Notes (Signed)
Established Patient Visit  Patient: Julie Neal   DOB: [birth year]   66 y.o. Female  MRN: [id number] Visit Date: 03/06/2022  Subjective:    Chief Complaint  Patient presents with   Office Visit    4 week f/u Depression / Syncope Says Valium isn't working for her No other concerns    HPI Esophageal spasm Reports valium 54m BID or 5360mdaily is ineffective with esophageal spasms. Has difficulty eating and swallowing tablets. She reports developed esophageal spasm after cervical spine surgery. She denies any near falls or falls or syncopal episode since last OV. Current MME/day: 6060We discussed her risk of another syncopal episode, increased risk of sedation and overdose when opioid is combined with a benzodiazepine. Also recommended referral to psychiatry to medication management. She declined referral to psychiatry. Advised that valium dose will not be increased to more than 60m10maily. She verbalized understanding. New rx sent Narcan rx also provided in past  Reviewed medical, surgical, and social history today  Medications: Outpatient Medications Prior to Visit  Medication Sig   atorvastatin (LIPITOR) 40 MG tablet Take 1 tablet (40 mg total) by mouth daily.   blood glucose meter kit and supplies Dispense based on patient and insurance preference. Once a day, before breakfast (FOR ICD-10 E11.65).   buPROPion (WELLBUTRIN XL) 300 MG 24 hr tablet Take 300 mg by mouth daily.   DULoxetine (CYMBALTA) 60 MG capsule Take 1 capsule (60 mg total) by mouth daily.   mupirocin ointment (BACTROBAN) 2 % Apply 1 application topically 2 (two) times daily. x7day   naloxone (NARCAN) nasal spray 4 mg/0.1 mL SMARTSIG:Spray(s) In Nostril   oxyCODONE-acetaminophen (PERCOCET) 10-325 MG tablet Take 1 tablet by mouth every 6 (six) hours as needed.   promethazine (PHENERGAN) 25 MG tablet Take 1 tablet (25 mg total) by mouth every 8 (eight) hours as needed for nausea or vomiting.    SUMAtriptan (IMITREX) 50 MG tablet Take 1 tablet (50 mg total) by mouth once as needed for up to 1 dose for migraine (may repeat x 1 tab in 1-2hrs if headache persists). May repeat in 2 hours if headache persists or recurs.   tiZANidine (ZANAFLEX) 4 MG tablet TAKE 1 TABLET BY MOUTH 4 TIMES A DAY AS NEEDED   traZODone (DESYREL) 100 MG tablet TAKE 2 TABLETS BY MOUTH IN THE EVENING AT BEDTIME   [DISCONTINUED] diazepam (VALIUM) 2 MG tablet Take 1 tablet (2 mg total) by mouth every 12 (twelve) hours.   [DISCONTINUED] CALCIUM CITRATE PO Take by mouth 2 (two) times daily. (Patient not taking: Reported on 01/29/2022)   [DISCONTINUED] Cholecalciferol (VITAMIN D3 PO) Take by mouth daily. (Patient not taking: Reported on 01/29/2022)   No facility-administered medications prior to visit.   Reviewed past medical and social history.   ROS per HPI above      Objective:  BP 126/76 (BP Location: Right Arm, Patient Position: Sitting, Cuff Size: Normal)   Pulse 71   Temp (!) 96.9 F (36.1 C) (Temporal)   Ht 5' 7"  (1.702 m)   Wt 152 lb 6.4 oz (69.1 kg)   SpO2 (!) 89%   BMI 23.87 kg/m      Physical Exam Cardiovascular:     Rate and Rhythm: Normal rate and regular rhythm.     Pulses: Normal pulses.     Heart sounds: Normal heart sounds.  Pulmonary:     Effort: Pulmonary  effort is normal.     Breath sounds: Normal breath sounds.  Musculoskeletal:     Right lower leg: No edema.     Left lower leg: No edema.  Neurological:     Mental Status: She is alert and oriented to person, place, and time.  Psychiatric:        Mood and Affect: Mood is anxious.        Speech: Speech normal.        Behavior: Behavior is cooperative.        Thought Content: Thought content normal.        Cognition and Memory: Cognition normal.     No results found for any visits on 03/06/22.    Assessment & Plan:    Problem List Items Addressed This Visit       Digestive   Esophageal spasm    Reports valium 17m BID  or 549mdaily is ineffective with esophageal spasms. Has difficulty eating and swallowing tablets. She reports developed esophageal spasm after cervical spine surgery. She denies any near falls or falls or syncopal episode since last OV. Current MME/day: 604We discussed her risk of another syncopal episode, increased risk of sedation and overdose when opioid is combined with a benzodiazepine. Also recommended referral to psychiatry to medication management. She declined referral to psychiatry. Advised that valium dose will not be increased to more than 37m12maily. She verbalized understanding. New rx sent Narcan rx also provided in past      Relevant Medications   diazepam (VALIUM) 5 MG tablet     Other   At high risk for falls   Relevant Orders   Ambulatory referral to [facility] abnormality   Relevant Orders   Ambulatory referral to [facility] depressive disorder   Relevant Medications   diazepam (VALIUM) 5 MG tablet   Other Visit Diagnoses     Generalized weakness    -  Primary   Relevant Orders   Ambulatory referral to [facility]   Return in about 2 months (around 05/07/2022) for DM and HTN (fasting).     ChaWilfred LacyP

## 2022-03-06 NOTE — Assessment & Plan Note (Addendum)
Reports valium '2mg'$  BID or '5mg'$  daily is ineffective with esophageal spasms. Has difficulty eating and swallowing tablets. She reports developed esophageal spasm after cervical spine surgery. She denies any near falls or falls or syncopal episode since last OV. Current MME/day: 32  We discussed her risk of another syncopal episode, increased risk of sedation and overdose when opioid is combined with a benzodiazepine. Also recommended referral to psychiatry to medication management. She declined referral to psychiatry. Advised that valium dose will not be increased to more than '5mg'$  daily. She verbalized understanding. New rx sent Narcan rx also provided in past

## 2022-03-20 DIAGNOSIS — M62830 Muscle spasm of back: Secondary | ICD-10-CM | POA: Diagnosis not present

## 2022-03-20 DIAGNOSIS — G894 Chronic pain syndrome: Secondary | ICD-10-CM | POA: Diagnosis not present

## 2022-03-20 DIAGNOSIS — M47812 Spondylosis without myelopathy or radiculopathy, cervical region: Secondary | ICD-10-CM | POA: Diagnosis not present

## 2022-03-20 DIAGNOSIS — M47817 Spondylosis without myelopathy or radiculopathy, lumbosacral region: Secondary | ICD-10-CM | POA: Diagnosis not present

## 2022-03-23 ENCOUNTER — Other Ambulatory Visit: Payer: Self-pay | Admitting: Nurse Practitioner

## 2022-03-23 DIAGNOSIS — F3341 Major depressive disorder, recurrent, in partial remission: Secondary | ICD-10-CM

## 2022-03-24 NOTE — Telephone Encounter (Signed)
Chart supports Rx Last OV: 02/2022 Next OV: 04/2022

## 2022-04-07 ENCOUNTER — Other Ambulatory Visit: Payer: Self-pay | Admitting: Nurse Practitioner

## 2022-04-16 DIAGNOSIS — M62830 Muscle spasm of back: Secondary | ICD-10-CM | POA: Diagnosis not present

## 2022-04-16 DIAGNOSIS — M47812 Spondylosis without myelopathy or radiculopathy, cervical region: Secondary | ICD-10-CM | POA: Diagnosis not present

## 2022-04-16 DIAGNOSIS — Z79891 Long term (current) use of opiate analgesic: Secondary | ICD-10-CM | POA: Diagnosis not present

## 2022-04-16 DIAGNOSIS — G894 Chronic pain syndrome: Secondary | ICD-10-CM | POA: Diagnosis not present

## 2022-04-16 DIAGNOSIS — M47817 Spondylosis without myelopathy or radiculopathy, lumbosacral region: Secondary | ICD-10-CM | POA: Diagnosis not present

## 2022-04-25 ENCOUNTER — Ambulatory Visit: Payer: Medicare Other | Admitting: Dietician

## 2022-05-07 ENCOUNTER — Ambulatory Visit: Payer: Medicare Other | Admitting: Nurse Practitioner

## 2022-05-12 ENCOUNTER — Other Ambulatory Visit: Payer: Self-pay | Admitting: Nurse Practitioner

## 2022-05-12 DIAGNOSIS — K224 Dyskinesia of esophagus: Secondary | ICD-10-CM

## 2022-05-15 DIAGNOSIS — G894 Chronic pain syndrome: Secondary | ICD-10-CM | POA: Diagnosis not present

## 2022-05-15 DIAGNOSIS — M47812 Spondylosis without myelopathy or radiculopathy, cervical region: Secondary | ICD-10-CM | POA: Diagnosis not present

## 2022-05-15 DIAGNOSIS — M47817 Spondylosis without myelopathy or radiculopathy, lumbosacral region: Secondary | ICD-10-CM | POA: Diagnosis not present

## 2022-05-15 DIAGNOSIS — M62830 Muscle spasm of back: Secondary | ICD-10-CM | POA: Diagnosis not present

## 2022-05-23 ENCOUNTER — Telehealth: Payer: Self-pay

## 2022-05-23 NOTE — Progress Notes (Signed)
Telephone follow up appointment with Care management team member scheduled for : 06/05/2022 at 11:00 am.  [place] Pharmacist Assistant [phone]

## 2022-06-02 DIAGNOSIS — M25512 Pain in left shoulder: Secondary | ICD-10-CM | POA: Diagnosis not present

## 2022-06-04 ENCOUNTER — Other Ambulatory Visit: Payer: Self-pay | Admitting: Specialist

## 2022-06-04 DIAGNOSIS — M25512 Pain in left shoulder: Secondary | ICD-10-CM

## 2022-06-05 ENCOUNTER — Ambulatory Visit (INDEPENDENT_AMBULATORY_CARE_PROVIDER_SITE_OTHER): Payer: Medicare Other

## 2022-06-05 DIAGNOSIS — K224 Dyskinesia of esophagus: Secondary | ICD-10-CM

## 2022-06-05 DIAGNOSIS — M5440 Lumbago with sciatica, unspecified side: Secondary | ICD-10-CM

## 2022-06-05 DIAGNOSIS — E1165 Type 2 diabetes mellitus with hyperglycemia: Secondary | ICD-10-CM

## 2022-06-05 NOTE — Patient Instructions (Signed)
Visit Information It was great speaking with you today!  Please let me know if you have any questions about our visit.   Goals Addressed             This Visit's Progress    Track and Manage My Symptoms-Depression   On track    Timeframe:  Long-Range Goal Priority:  High Start Date:  05/10/21                            Expected End Date: 05/11/2023                      Follow Up within 90 days   - avoid negative self-talk - exercise at least 2 to 3 times per week - have a plan for how to handle bad days - spend time or talk with others at least 2 to 3 times per week - watch for early signs of feeling worse    Why is this important?   Keeping track of your progress will help your treatment team find the right mix of medicine and therapy for you.  Write in your journal every day.  Day-to-day changes in depression symptoms are normal. It may be more helpful to check your progress at the end of each week instead of every day.     Notes:         Patient Care Plan: General Pharmacy (Adult)     Problem Identified: Hyperlipidemia, Depression, Anxiety, and Insomnia, Chronic Pain   Priority: High     Long-Range Goal: Patient-Specific Goal   Start Date: 05/10/2021  Expected End Date: 06/06/2023  This Visit's Progress: On track  Recent Progress: On track  Priority: High  Note:   Current Barriers:  Unable to maintain control of depression  Pharmacist Clinical Goal(s):  Patient will maintain control of depression, anxiety as evidenced by patient report  through collaboration with PharmD and provider.   Interventions: 1:1 collaboration with Nche, Charlene Brooke, NP regarding development and update of comprehensive plan of care as evidenced by provider attestation and co-signature Inter-disciplinary care team collaboration (see longitudinal plan of care) Comprehensive medication review performed; medication list updated in electronic medical record  Hyperlipidemia: (LDL goal <  100) -Controlled -Current treatment: Atorvastatin 40 mg daily   -Medications previously tried: NA  -Current dietary patterns: Limits food in eveining, limits red meat, fatty foods. Soft foods. Boost, water, rarelr Root Beer.  -Current exercise habits: Tries to walk when possible, limited by pain   -Educated on Importance of limiting foods high in cholesterol; -Recommended to continue current medication  Depression/Anxiety (Goal: Maintain disease remission) -Uncontrolled -Current treatment: Bupropion XL 300 mg daily  Diazepam 5 mg daily as needed  Duloxetine 60 mg daily  Trazodone 100 mg 2 tablets nightly -Medications previously tried/failed: NA -Patient reports poor sleep quality. Wakes up every few hours due to pain. Her diazepam was reduced to once daily, and since then she has noted significant worsening of her esophageal spasms with notably worse dysphagia. Patient does not weight herself, but thinks she may have lost 20 pounds since her PCP follow-up 3 months ago. She continue to drink Boost shakes twice daily.  -Continue current medications   Osteopenia (Goal Prevent bone fractures) -Controlled -Last DEXA Scan: 12/09/19   T-Score femoral neck: -1.8  T-Score total hip: -1.7  T-Score lumbar spine: -2.4  T-Score forearm radius: NA  10-year probability of major osteoporotic fracture:  15.3%  10-year probability of hip fracture: 2.1% -Patient is not a candidate for pharmacologic treatment -Current treatment  Calcium Citrate twice daily  Vitamin D3 daily  -Medications previously tried: Alendronate (2017-2022)  -Recommend 1200 mg of calcium daily from dietary and supplemental sources. -Recommended to continue current medication  Chronic Pain (Goal: Achieve adequate pain control) -Uncontrolled -Current treatment  Tizanidine 2 mg four times daily as needed  Oxycodone-APAP 10-325 mg q6hr PRN  -Medications previously tried: NA  -Rates pain as a 10/10 despite frequent use of  tizanidine and oxycodone. She was unable to tolerate Oxycodone 7.5 mg. She also reports she has muscle spasms constantly since her tizanidine was decreased by pain management to 2 mg. She would prefer to resume the 4 mg dose, but pain management does not want to increase this due to fall risk.  -Recommended to continue current medication  Patient Goals/Self-Care Activities Patient will:  - take medications as prescribed  Follow Up Plan: Telephone follow up appointment with care management team member scheduled for:  12/11/2022 at 11:00 AM    Patient agreed to services and verbal consent obtained.   The patient verbalized understanding of instructions, educational materials, and care plan provided today and DECLINED offer to receive copy of patient instructions, educational materials, and care plan.   Junius Argyle, PharmD, Para [place], CPP Clinical Pharmacist Practitioner  [facility]  [phone]

## 2022-06-05 NOTE — Progress Notes (Signed)
Chronic Care Management Pharmacy Note  06/05/2022 Name:  LYSANDRA LOUGHMILLER MRN:  [id number] DOB:  Dec 16, 1955  Summary: Patient presents for CCM follow-up.   -Patient reports poor sleep quality. Wakes up every few hours due to pain. Her diazepam was reduced to once daily, and since then she has noted significant worsening of her esophageal spasms with notably worse dysphagia. Patient does not weight herself, but thinks she may have lost 20 pounds since her PCP follow-up 3 months ago. She continue to drink Boost shakes twice daily.   -Rates pain as a 10/10 despite frequent use of tizanidine and oxycodone. She was unable to tolerate Oxycodone 7.5 mg. She also reports she has muscle spasms constantly since her tizanidine was decreased by pain management to 2 mg. She would prefer to resume the 4 mg dose, but pain management does not want to increase this due to fall risk.   Recommendations/Changes made from today's visit: Continue current medications  Would recommend continuing to work with patient to minimize pain symptoms while minimizing risk of CNS depression, falls.   Plan: CPP follow-up 6 months   Subjective: JULIANY DAUGHETY is an 66 y.o. year old female who is a primary patient of Nche, Charlene Brooke, NP.  The CCM team was consulted for assistance with disease management and care coordination needs.    Engaged with patient by telephone for initial visit in response to provider referral for pharmacy case management and/or care coordination services.   Consent to Services:  The patient was given the following information about Chronic Care Management services today, agreed to services, and gave verbal consent: 1. CCM service includes personalized support from designated clinical staff supervised by the primary care provider, including individualized plan of care and coordination with other care providers 2. 24/7 contact phone numbers for assistance for urgent and routine care needs. 3.  Service will only be billed when office clinical staff spend 20 minutes or more in a month to coordinate care. 4. Only one practitioner may furnish and bill the service in a calendar month. 5.The patient may stop CCM services at any time (effective at the end of the month) by phone call to the office staff. 6. The patient will be responsible for cost sharing (co-pay) of up to 20% of the service fee (after annual deductible is met). Patient agreed to services and consent obtained.  Patient Care Team: Nche, Charlene Brooke, NP as PCP - General (Internal Medicine) Melina Schools, MD as Referring Physician (Orthopedic Surgery) Germaine Pomfret, [facility] as Pharmacist (Pharmacist) Margaretha Sheffield, MD as Referring Physician (Physical Medicine and Rehabilitation)  Recent office visits: 02/22/2021 Letta Median DO (PCP Office)Increase diazepam (VALIUM) to  5 MG tablet; Take 1 tablet (5 mg total) by mouth every 12 (twelve) hours as needed for anxiety or muscle spasms ,ketorolac (TORADOL) injection 60 mg given, start Promethazine HCI 25 mg PRN 02/13/2021 Letta Median DO (PCP Office) sent in 14 tablets of 5 mg Valium 01/29/2021 Wilfred Lacy NP (PCP) decrease diazepam to 2 mg, take 1 tablet daily PRN, A1C was 6.1% 12/31/2020 Wilfred Lacy NP (PCP) Stop Bactroban ointment, Start oral bactrim 400-80 mg 2 times daily, referral to dermatology 12/24/2020 Wilfred Lacy NP (PCP) AMB Referral to [facility] Coordination,start Mupirocin 2% for 7 days 12/05/2020 Letta Median DO (PCP Office) start Zofran 4 mg PRN ,start Sumatriptan 50 mg PRN, Ketorolac Tromethamine 60 mg  given  11/20/2020 Caroleen Hamman LPN (PCP Office) Mammogram Ordered, No medication changes noted  Recent consult  visits: 12/14/2020 Margaretha Sheffield MD (Physical Medicine and Rehabilitation) Unable to see note 12/11/2020 Margaretha Sheffield MD (Physical Medicine and Rehabilitation) Unable to see note 11/13/2020 Margaretha Sheffield MD (Physical Medicine  and Rehabilitation) Unable to see note 11/05/2020 Dr.Yan MD (Neurology) Procedure completed, NCV with EMG, Start Cymbalta 30 mg daily, Refer to physical therapy  Hospital visits: None in previous 6 months   Objective:  Lab Results  Component Value Date   CREATININE 1.26 (H) 01/15/2022   BUN 15 01/15/2022   GFR 50.87 (L) 06/26/2021   GFRNONAA 47 (L) 01/15/2022   GFRAA 72 09/22/2019   NA 138 01/15/2022   K 3.9 01/15/2022   CALCIUM 9.3 01/15/2022   CO2 27 01/15/2022   GLUCOSE 154 (H) 01/15/2022    Lab Results  Component Value Date/Time   HGBA1C 6.6 (A) 01/29/2022 11:51 AM   HGBA1C 6.4 06/26/2021 10:11 AM   HGBA1C 6.1 (A) 01/29/2021 08:51 AM   HGBA1C 5.4 09/22/2019 12:17 PM   GFR 50.87 (L) 06/26/2021 10:11 AM   GFR 61.31 12/24/2020 09:53 AM    Last diabetic Eye exam: No results found for: "HMDIABEYEEXA"  Last diabetic Foot exam: No results found for: "HMDIABFOOTEX"   Lab Results  Component Value Date   CHOL 160 12/24/2020   HDL 47.00 12/24/2020   LDLCALC 83 12/24/2020   TRIG 149.0 12/24/2020   CHOLHDL 3 12/24/2020       Latest Ref Rng & Units 12/28/2021    6:17 AM 06/26/2021   10:11 AM 12/24/2020    9:53 AM  Hepatic Function  Total Protein 6.5 - 8.1 g/dL 7.2  7.5  6.9   Albumin 3.5 - 5.0 g/dL 3.8  4.6  4.3   AST 15 - 41 U/L 43  32  20   ALT 0 - 44 U/L 31  32  13   Alk Phosphatase 38 - 126 U/L 43  59  52   Total Bilirubin 0.3 - 1.2 mg/dL 0.8  0.5  0.5   Bilirubin, Direct 0.0 - 0.3 mg/dL   0.1     Lab Results  Component Value Date/Time   TSH 1.04 06/26/2021 10:11 AM   TSH 0.94 06/01/2017 10:02 AM       Latest Ref Rng & Units 01/15/2022   10:45 AM 12/29/2021    1:58 AM 12/28/2021    6:17 AM  CBC  WBC 4.0 - 10.5 K/uL 5.0  5.6  9.3   Hemoglobin 12.0 - 15.0 g/dL 12.2  11.8  12.6   Hematocrit 36.0 - 46.0 % 36.1  35.0  37.5   Platelets 150 - 400 K/uL 206  176  182     No results found for: "VD25OH"  Clinical ASCVD: No  The 10-year ASCVD risk score (Arnett  DK, et al., 2019) is: 10.8%   Values used to calculate the score:     Age: 66 years     Sex: Female     Is Non-Hispanic African American: No     Diabetic: Yes     Tobacco smoker: No     Systolic Blood Pressure: 846 mmHg     Is BP treated: No     HDL Cholesterol: 47 mg/dL     Total Cholesterol: 160 mg/dL       03/06/2022    9:24 AM 01/29/2022    1:05 PM 06/26/2021   10:14 AM  Depression screen PHQ 2/9  Decreased Interest 0 2 0  Down, Depressed, Hopeless 0 2 1  PHQ -  2 Score 0 4 1  Altered sleeping 0 2 0  Tired, decreased energy 0 1 0  Change in appetite 0 1 2  Feeling bad or failure about yourself  0 0 0  Trouble concentrating 0 0 1  Moving slowly or fidgety/restless 0 0 0  Suicidal thoughts 0 0 0  PHQ-9 Score 0 8 4  Difficult doing work/chores Not difficult at all Not difficult at all Somewhat difficult    Social History   Tobacco Use  Smoking Status Former   Types: Cigarettes  Smokeless Tobacco Never  Tobacco Comments   Quit in 1988   BP Readings from Last 3 Encounters:  03/06/22 126/76  01/29/22 116/86  01/15/22 (!) 117/104   Pulse Readings from Last 3 Encounters:  03/06/22 71  01/29/22 84  01/15/22 61   Wt Readings from Last 3 Encounters:  03/06/22 152 lb 6.4 oz (69.1 kg)  01/29/22 160 lb 12.8 oz (72.9 kg)  12/28/21 168 lb 4.8 oz (76.3 kg)   BMI Readings from Last 3 Encounters:  03/06/22 23.87 kg/m  01/29/22 25.18 kg/m  12/28/21 26.36 kg/m    Assessment/Interventions: Review of patient past medical history, allergies, medications, health status, including review of consultants reports, laboratory and other test data, was performed as part of comprehensive evaluation and provision of chronic care management services.   SDOH:  (Social Determinants of Health) assessments and interventions performed: Yes SDOH Interventions    Flowsheet Row Chronic Care Management from 04/22/2021 in [facility] Visit from 12/24/2020 in [facility]  SDOH Interventions    Depression Interventions/Treatment  -- Medication, Currently on Treatment  Financial Strain Interventions Intervention Not Indicated --      SDOH Screenings   Food Insecurity: No Food Insecurity (11/20/2020)  Housing: Low Risk  (11/20/2020)  Transportation Needs: No Transportation Needs (11/20/2020)  Alcohol Screen: Low Risk  (11/20/2020)  Depression (PHQ2-9): Low Risk  (03/06/2022)  Recent Concern: Depression (PHQ2-9) - Medium Risk (01/29/2022)  Financial Resource Strain: Low Risk  (05/10/2021)  Physical Activity: Sufficiently Active (11/20/2020)  Social Connections: Moderately Isolated (11/20/2020)  Stress: No Stress Concern Present (11/20/2020)  Tobacco Use: Medium Risk (03/06/2022)    CCM Care Plan  Allergies  Allergen Reactions   Mobic [Meloxicam] Anaphylaxis   Penicillins Anaphylaxis   Nubain [Nalbuphine Hcl] Diarrhea and Nausea And Vomiting    Medications Reviewed Today     Reviewed by Flossie Buffy, NP (Nurse Practitioner) on 03/06/22 at [facility] List Status: <None>   Medication Order Taking? Sig Documenting Provider Last Dose Status Informant  atorvastatin (LIPITOR) 40 MG tablet [id number] Yes Take 1 tablet (40 mg total) by mouth daily. Flossie Buffy, NP Taking Active   blood glucose meter kit and supplies [id number] Yes Dispense based on patient and insurance preference. Once a day, before breakfast (FOR ICD-10 E11.65). Nche, Charlene Brooke, NP Taking Active   buPROPion (WELLBUTRIN XL) 300 MG 24 hr tablet [id number] Yes Take 300 mg by mouth daily. [provider] Taking Active   diazepam (VALIUM) 5 MG tablet [id number]  Take 1 tablet (5 mg total) by mouth daily. Flossie Buffy, NP  Active   DULoxetine (CYMBALTA) 60 MG capsule [id number] Yes Take 1 capsule (60 mg total) by mouth daily. Nche, Charlene Brooke, NP Taking Active   mupirocin ointment (BACTROBAN) 2 % [id number] Yes Apply 1 application topically 2  (two) times daily. x7day Nche, Charlene Brooke, NP Taking Active   naloxone [facility]) nasal  spray 4 mg/0.1 mL [id number] Yes SMARTSIG:Spray(s) In Nostril [provider] Taking Active   oxyCODONE-acetaminophen (PERCOCET) 10-325 MG tablet [id number] Yes Take 1 tablet by mouth every 6 (six) hours as needed. [provider] Taking Active   promethazine (PHENERGAN) 25 MG tablet [id number] Yes Take 1 tablet (25 mg total) by mouth every 8 (eight) hours as needed for nausea or vomiting. Nche, Charlene Brooke, NP Taking Active   SUMAtriptan (IMITREX) 50 MG tablet [id number] Yes Take 1 tablet (50 mg total) by mouth once as needed for up to 1 dose for migraine (may repeat x 1 tab in 1-2hrs if headache persists). May repeat in 2 hours if headache persists or recurs. Nche, Charlene Brooke, NP Taking Active   tiZANidine (ZANAFLEX) 4 MG tablet [id number] Yes TAKE 1 TABLET BY MOUTH 4 TIMES A DAY AS NEEDED [provider] Taking Active   traZODone (DESYREL) 100 MG tablet [id number] Yes TAKE 2 TABLETS BY MOUTH IN THE EVENING AT BEDTIME Nche, Charlene Brooke, NP Taking Active             Patient Active Problem List   Diagnosis Date Noted   Open dislocation of proximal interphalangeal (PIP) joint of left little finger 12/28/2021   Major depressive disorder 12/28/2021   Fever 12/28/2021   Abnormal rhythmic movement of tongue 12/28/2021   Syncope 12/27/2021   At high risk for falls 06/26/2021   DM (diabetes mellitus) ([place]) 01/29/2021   Esophageal spasm 01/29/2021   Bilateral low back pain with sciatica 11/05/2020   Gait abnormality 10/04/2020   Paresthesia 10/04/2020   Leg length inequality 02/16/2020   Scoliosis deformity of spine 02/16/2020   Finger pain, left 06/08/2017   Asymptomatic stenosis of right carotid artery    Fracture of ankle, bimalleolar, left, closed 08/10/2014   Shaking 01/12/2013   Anxiety    Memory loss    Lumbago    Fibromyalgia    Migraines    Mixed hyperlipidemia     Depression, recurrent (HCC)    Neuralgic migraines    PULMONARY NODULE 07/25/2010   H/O migraine 01/05/2007    Immunization History  Administered Date(s) Administered   Fluad Quad(high Dose 65+) 06/26/2020, 06/26/2021   Influenza, High Dose Seasonal PF 05/31/2018   Influenza,inj,Quad PF,6+ Mos 07/18/2014, 07/23/2015, 05/23/2016, 05/15/2017   Influenza-Unspecified 05/12/2019   PFIZER(Purple Top)SARS-COV-2 Vaccination 11/10/2019, 12/05/2019, 06/11/2020   Pneumococcal Conjugate-13 06/26/2021   Tdap 07/18/2014, 05/16/2016, 12/27/2021   Zoster Recombinat (Shingrix) 10/16/2020, 03/26/2021    Conditions to be addressed/monitored:  Hyperlipidemia, Depression, Anxiety, and Insomnia, Chronic Pain  Care Plan : General Pharmacy (Adult)  Updates made by Germaine Pomfret, RPH since 06/05/2022 12:00 AM     Problem: Hyperlipidemia, Depression, Anxiety, and Insomnia, Chronic Pain   Priority: High     Long-Range Goal: Patient-Specific Goal   Start Date: 05/10/2021  Expected End Date: 06/06/2023  This Visit's Progress: On track  Recent Progress: On track  Priority: High  Note:   Current Barriers:  Unable to maintain control of depression  Pharmacist Clinical Goal(s):  Patient will maintain control of depression, anxiety as evidenced by patient report  through collaboration with PharmD and provider.   Interventions: 1:1 collaboration with Nche, Charlene Brooke, NP regarding development and update of comprehensive plan of care as evidenced by provider attestation and co-signature Inter-disciplinary care team collaboration (see longitudinal plan of care) Comprehensive medication review performed; medication list updated in electronic medical record  Hyperlipidemia: (LDL goal < 100) -Controlled -Current treatment: Atorvastatin 40 mg  daily   -Medications previously tried: NA  -Current dietary patterns: Limits food in eveining, limits red meat, fatty foods. Soft foods. Boost, water,  rarelr Root Beer.  -Current exercise habits: Tries to walk when possible, limited by pain   -Educated on Importance of limiting foods high in cholesterol; -Recommended to continue current medication  Depression/Anxiety (Goal: Maintain disease remission) -Uncontrolled -Current treatment: Bupropion XL 300 mg daily  Diazepam 5 mg daily as needed  Duloxetine 60 mg daily  Trazodone 100 mg 2 tablets nightly -Medications previously tried/failed: NA -Patient reports poor sleep quality. Wakes up every few hours due to pain. Her diazepam was reduced to once daily, and since then she has noted significant worsening of her esophageal spasms with notably worse dysphagia. Patient does not weight herself, but thinks she may have lost 20 pounds since her PCP follow-up 3 months ago. She continue to drink Boost shakes twice daily.  -Continue current medications   Osteopenia (Goal Prevent bone fractures) -Controlled -Last DEXA Scan: 12/09/19   T-Score femoral neck: -1.8  T-Score total hip: -1.7  T-Score lumbar spine: -2.4  T-Score forearm radius: NA  10-year probability of major osteoporotic fracture: 15.3%  10-year probability of hip fracture: 2.1% -Patient is not a candidate for pharmacologic treatment -Current treatment  Calcium Citrate twice daily  Vitamin D3 daily  -Medications previously tried: Alendronate (2017-2022)  -Recommend 1200 mg of calcium daily from dietary and supplemental sources. -Recommended to continue current medication  Chronic Pain (Goal: Achieve adequate pain control) -Uncontrolled -Current treatment  Tizanidine 2 mg four times daily as needed  Oxycodone-APAP 10-325 mg q6hr PRN  -Medications previously tried: NA  -Rates pain as a 10/10 despite frequent use of tizanidine and oxycodone. She was unable to tolerate Oxycodone 7.5 mg. She also reports she has muscle spasms constantly since her tizanidine was decreased by pain management to 2 mg. She would prefer to resume the 4  mg dose, but pain management does not want to increase this due to fall risk.  -Recommended to continue current medication  Patient Goals/Self-Care Activities Patient will:  - take medications as prescribed  Follow Up Plan: Telephone follow up appointment with care management team member scheduled for:  12/11/2022 at 11:00 AM    Medication Assistance: None required.  Patient affirms current coverage meets needs.  Compliance/Adherence/Medication fill history: Care Gaps: Shingrix Vaccine PNA Vaccine COVID-19 Vaccine Influenza Vaccine  Star-Rating Drugs: Atorvastatin 40 mg last filled 03/12/2022 for 90 day supply at [facility].  Patient's preferred pharmacy is:  [facility] ([facility] [place] - [address] [place] ([place] [address] Phone: [phone] Fax: [phone]  [id number] [place]  [id number]: [phone]: [phone]  Uses pill box? Yes Pt endorses 100% compliance  We discussed: Benefits of medication synchronization, packaging and delivery as well as enhanced pharmacist oversight with [employer]. Patient decided to: Continue current medication management strategy  Care Plan and Follow Up Patient Decision:  Patient agrees to Care Plan and Follow-up.  Plan: Telephone follow up appointment with care management team member scheduled for:  12/11/2022 at 11:00 AM  AJunius Argyle PharmD, BPara March CPP Clinical Pharmacist [facility] [phone]

## 2022-06-10 ENCOUNTER — Telehealth: Payer: Self-pay | Admitting: Nurse Practitioner

## 2022-06-10 DIAGNOSIS — E785 Hyperlipidemia, unspecified: Secondary | ICD-10-CM

## 2022-06-10 DIAGNOSIS — F32A Depression, unspecified: Secondary | ICD-10-CM | POA: Diagnosis not present

## 2022-06-10 NOTE — Telephone Encounter (Signed)
[facility] or [facility] is calling to see if fax has been received. This form pertains to getting all her scripts sent to them, for them to package her scripts for her and send out as a packet. This is the pharmacist coordinator's direct line, if you have any questions. [phone]

## 2022-06-11 ENCOUNTER — Telehealth: Payer: Self-pay

## 2022-06-11 DIAGNOSIS — F3341 Major depressive disorder, recurrent, in partial remission: Secondary | ICD-10-CM

## 2022-06-11 DIAGNOSIS — K224 Dyskinesia of esophagus: Secondary | ICD-10-CM

## 2022-06-11 DIAGNOSIS — E785 Hyperlipidemia, unspecified: Secondary | ICD-10-CM

## 2022-06-11 NOTE — Telephone Encounter (Signed)
Faxed received, will fill out and send back

## 2022-06-11 NOTE — Telephone Encounter (Signed)
Preferred Pharmacy, [facility], pt has f/u appt scheduled for 07/08/22.

## 2022-06-12 MED ORDER — ATORVASTATIN CALCIUM 40 MG PO TABS: 40.0000 mg | ORAL_TABLET | Freq: Every day | ORAL | 3 refills | Status: DC

## 2022-06-12 MED ORDER — TRAZODONE HCL 100 MG PO TABS: 100.0000 mg | ORAL_TABLET | Freq: Every day | ORAL | 1 refills | Status: DC

## 2022-06-12 MED ORDER — DIAZEPAM 5 MG PO TABS: 5.0000 mg | ORAL_TABLET | Freq: Every day | ORAL | 0 refills | Status: DC

## 2022-06-12 NOTE — Addendum Note (Signed)
Addended by: Leana Gamer on: 06/12/2022 02:46 PM   Modules accepted: Orders

## 2022-06-23 ENCOUNTER — Telehealth: Payer: Self-pay | Admitting: Nurse Practitioner

## 2022-06-23 DIAGNOSIS — M47817 Spondylosis without myelopathy or radiculopathy, lumbosacral region: Secondary | ICD-10-CM | POA: Diagnosis not present

## 2022-06-23 DIAGNOSIS — M47812 Spondylosis without myelopathy or radiculopathy, cervical region: Secondary | ICD-10-CM | POA: Diagnosis not present

## 2022-06-23 DIAGNOSIS — M62830 Muscle spasm of back: Secondary | ICD-10-CM | POA: Diagnosis not present

## 2022-06-23 DIAGNOSIS — G894 Chronic pain syndrome: Secondary | ICD-10-CM | POA: Diagnosis not present

## 2022-06-23 NOTE — Telephone Encounter (Signed)
Called & spoke w/ pt. Provided Phone number to [facility]. Says she will call them to verify. Pt states that she also needs her Trazodone filled as well.

## 2022-06-23 NOTE — Telephone Encounter (Signed)
Caller Name: Marthann Abshier Call back phone #: [phone]  MEDICATION(S):  Diazepam  Has the patient contacted their pharmacy (YES/NO)? yes What did pharmacy advise?   Preferred Pharmacy:  [facility] ([facility]), [place]  SYVGC:[phone]:[phone]   ~~~Please advise patient/caregiver to allow 2-3 business days to process RX refills.

## 2022-06-25 ENCOUNTER — Other Ambulatory Visit: Payer: Self-pay | Admitting: Nurse Practitioner

## 2022-06-25 DIAGNOSIS — K224 Dyskinesia of esophagus: Secondary | ICD-10-CM

## 2022-07-08 ENCOUNTER — Ambulatory Visit (INDEPENDENT_AMBULATORY_CARE_PROVIDER_SITE_OTHER): Payer: Medicare Other | Admitting: Nurse Practitioner

## 2022-07-08 ENCOUNTER — Encounter: Payer: Self-pay | Admitting: Nurse Practitioner

## 2022-07-08 VITALS — BP 114/74 | HR 76 | Temp 97.0°F | Ht 67.0 in | Wt 140.2 lb

## 2022-07-08 DIAGNOSIS — E1165 Type 2 diabetes mellitus with hyperglycemia: Secondary | ICD-10-CM | POA: Diagnosis not present

## 2022-07-08 DIAGNOSIS — E785 Hyperlipidemia, unspecified: Secondary | ICD-10-CM | POA: Diagnosis not present

## 2022-07-08 DIAGNOSIS — K224 Dyskinesia of esophagus: Secondary | ICD-10-CM

## 2022-07-08 DIAGNOSIS — F3342 Major depressive disorder, recurrent, in full remission: Secondary | ICD-10-CM | POA: Diagnosis not present

## 2022-07-08 DIAGNOSIS — F411 Generalized anxiety disorder: Secondary | ICD-10-CM | POA: Diagnosis not present

## 2022-07-08 DIAGNOSIS — Z23 Encounter for immunization: Secondary | ICD-10-CM | POA: Diagnosis not present

## 2022-07-08 DIAGNOSIS — R55 Syncope and collapse: Secondary | ICD-10-CM | POA: Diagnosis not present

## 2022-07-08 DIAGNOSIS — G4700 Insomnia, unspecified: Secondary | ICD-10-CM | POA: Diagnosis not present

## 2022-07-08 DIAGNOSIS — E782 Mixed hyperlipidemia: Secondary | ICD-10-CM

## 2022-07-08 DIAGNOSIS — F3341 Major depressive disorder, recurrent, in partial remission: Secondary | ICD-10-CM

## 2022-07-08 LAB — LIPID PANEL
Cholesterol: 133 mg/dL (ref 0–200)
HDL: 51 mg/dL (ref >39.00)
LDL Cholesterol: 57 mg/dL (ref 0–99)
NonHDL: 82.14
Total CHOL/HDL Ratio: 3
Triglycerides: 128 mg/dL (ref 0.0–149.0)
VLDL: 25.6 mg/dL (ref 0.0–40.0)

## 2022-07-08 LAB — BASIC METABOLIC PANEL
BUN: 22 mg/dL (ref 6–23)
CO2: 32 mEq/L (ref 19–32)
Calcium: 10 mg/dL (ref 8.4–10.5)
Chloride: 103 mEq/L (ref 96–112)
Creatinine, Ser: 1.05 mg/dL (ref 0.40–1.20)
GFR: 55.15 mL/min — ABNORMAL LOW (ref >60.00)
Glucose, Bld: 91 mg/dL (ref 70–99)
Potassium: 4.2 mEq/L (ref 3.5–5.1)
Sodium: 142 mEq/L (ref 135–145)

## 2022-07-08 LAB — HEPATIC FUNCTION PANEL
ALT: 19 U/L (ref 0–35)
AST: 23 U/L (ref 0–37)
Albumin: 4.7 g/dL (ref 3.5–5.2)
Alkaline Phosphatase: 49 U/L (ref 39–117)
Bilirubin, Direct: 0.1 mg/dL (ref 0.0–0.3)
Total Bilirubin: 0.5 mg/dL (ref 0.2–1.2)
Total Protein: 7.6 g/dL (ref 6.0–8.3)

## 2022-07-08 LAB — HEMOGLOBIN A1C: Hgb A1c MFr Bld: 5.8 % (ref 4.6–6.5)

## 2022-07-08 LAB — MICROALBUMIN / CREATININE URINE RATIO
Creatinine,U: 176.6 mg/dL
Microalb Creat Ratio: 0.5 mg/g (ref 0.0–30.0)
Microalb, Ur: 0.9 mg/dL (ref 0.0–1.9)

## 2022-07-08 MED ORDER — DULOXETINE HCL 60 MG PO CPEP: 60.0000 mg | ORAL_CAPSULE | Freq: Every day | ORAL | 3 refills | Status: AC

## 2022-07-08 MED ORDER — DIAZEPAM 5 MG PO TABS: 5.0000 mg | ORAL_TABLET | Freq: Every day | ORAL | 0 refills | Status: DC

## 2022-07-08 MED ORDER — ATORVASTATIN CALCIUM 40 MG PO TABS: 40.0000 mg | ORAL_TABLET | Freq: Every day | ORAL | 3 refills | Status: AC

## 2022-07-08 MED ORDER — TRAZODONE HCL 100 MG PO TABS: 100.0000 mg | ORAL_TABLET | Freq: Every day | ORAL | 1 refills | Status: DC

## 2022-07-08 NOTE — Patient Instructions (Signed)
Maintain med dose Go to lab for blood draw and urine collection. Schedule appt with ophthalmology for annual diabetic eye exam. Schedule appt with wellness coach You will be contacted to schedule appt with psychiatry

## 2022-07-08 NOTE — Progress Notes (Signed)
Established Patient Visit  Patient: Julie Neal   DOB: 04/10/1956   66 y.o. Female  MRN: [id number] Visit Date: 07/08/2022  Subjective:    Chief Complaint  Patient presents with   Office Visit    Med F/u , pt fasting  Doesn't check BP , checks BS daily  Still struggles with insomnia  Flu & pneumonia vaccine given today  Isn't satisfied with [facility]. Doesn't want anymore meds going there    HPI DM (diabetes mellitus) ([facility]) Repeat hgbA1c, bmp and urine microabumin today: normal with hgbA1c at 5.8% improved Reports home fasting glucose: 90s-100s No medication use at this time. Advised to schedule appt for annual eye exam  GAD (generalized anxiety disorder) Stable mood but continues to struggle with insomnia (initiating and staying asleep) current use of cymbalta, valium and trazodone. With hx of syncope, high fall risk and use of an opioid, I referred to psychiatry to help with medication management. Maintain current medication doses  Major depressive disorder Stable with wellbutrin  Mixed hyperlipidemia Repeat lipid panel: LDL at goal Current use of lipitor with no adverse effects  Syncope No falls or syncopal episodes in last 79month  Reviewed medical, surgical, and social history today  Medications: Outpatient Medications Prior to Visit  Medication Sig   ACCU-CHEK GUIDE test strip USE   TO CHECK GLUCOSE ONCE DAILY BEFORE BREAKFAST   blood glucose meter kit and supplies Dispense based on patient and insurance preference. Once a day, before breakfast (FOR ICD-10 E11.65).   buPROPion (WELLBUTRIN XL) 300 MG 24 hr tablet Take 300 mg by mouth daily.   naloxone (NARCAN) nasal spray 4 mg/0.1 mL SMARTSIG:Spray(s) In Nostril   oxyCODONE-acetaminophen (PERCOCET) 10-325 MG tablet Take 1 tablet by mouth every 6 (six) hours as needed.   promethazine (PHENERGAN) 25 MG tablet Take 1 tablet (25 mg total) by mouth every 8 (eight) hours as needed for nausea or  vomiting.   SUMAtriptan (IMITREX) 50 MG tablet Take 1 tablet (50 mg total) by mouth once as needed for up to 1 dose for migraine (may repeat x 1 tab in 1-2hrs if headache persists). May repeat in 2 hours if headache persists or recurs.   tizanidine (ZANAFLEX) 2 MG capsule Take 2 mg by mouth in the morning, at noon, in the evening, and at bedtime.   [DISCONTINUED] atorvastatin (LIPITOR) 40 MG tablet Take 1 tablet (40 mg total) by mouth daily.   [DISCONTINUED] DULoxetine (CYMBALTA) 60 MG capsule Take 1 capsule (60 mg total) by mouth daily.   [DISCONTINUED] traZODone (DESYREL) 100 MG tablet Take 1 tablet (100 mg total) by mouth at bedtime.   [DISCONTINUED] clindamycin (CLEOCIN) 300 MG capsule Take 300 mg by mouth 4 (four) times daily. (Patient not taking: Reported on 07/08/2022)   [DISCONTINUED] diazepam (VALIUM) 5 MG tablet Take 1 tablet (5 mg total) by mouth daily. Maintain appt on 07/08/2022 to get additional refills (Patient not taking: Reported on 07/08/2022)   [DISCONTINUED] doxycycline (ADOXA) 100 MG tablet Take 100 mg by mouth 2 (two) times daily. (Patient not taking: Reported on 07/08/2022)   [DISCONTINUED] mupirocin ointment (BACTROBAN) 2 % Apply 1 application topically 2 (two) times daily. x7day (Patient not taking: Reported on 07/08/2022)   No facility-administered medications prior to visit.   Reviewed past medical and social history.   ROS per HPI above      Objective:  BP 114/74 (BP Location: Right Arm, Patient Position:  Sitting, Cuff Size: Normal)   Pulse 76   Temp (!) 97 F (36.1 C) (Temporal)   Ht _0  (1.702 m)   Wt 140 lb 3.2 oz (63.6 kg)   SpO2 93%   BMI 21.96 kg/m      Physical Exam Cardiovascular:     Rate and Rhythm: Normal rate and regular rhythm.     Pulses: Normal pulses.     Heart sounds: Normal heart sounds.  Pulmonary:     Effort: Pulmonary effort is normal.     Breath sounds: Normal breath sounds.  Musculoskeletal:     Right lower leg: No edema.      Left lower leg: No edema.  Neurological:     Mental Status: She is alert and oriented to person, place, and time.  Psychiatric:        Mood and Affect: Mood normal.        Behavior: Behavior normal.     Results for orders placed or performed in visit on 07/08/22  Hemoglobin A1c  Result Value Ref Range   Hgb A1c MFr Bld 5.8 4.6 - 6.5 %  Basic metabolic panel  Result Value Ref Range   Sodium 142 135 - 145 mEq/L   Potassium 4.2 3.5 - 5.1 mEq/L   Chloride 103 96 - 112 mEq/L   CO2 32 19 - 32 mEq/L   Glucose, Bld 91 70 - 99 mg/dL   BUN 22 6 - 23 mg/dL   Creatinine, Ser 1.05 0.40 - 1.20 mg/dL   GFR 55.15 (L) >60.00 mL/min   Calcium 10.0 8.4 - 10.5 mg/dL  Hepatic function panel  Result Value Ref Range   Total Bilirubin 0.5 0.2 - 1.2 mg/dL   Bilirubin, Direct 0.1 0.0 - 0.3 mg/dL   Alkaline Phosphatase 49 39 - 117 U/L   AST 23 0 - 37 U/L   ALT 19 0 - 35 U/L   Total Protein 7.6 6.0 - 8.3 g/dL   Albumin 4.7 3.5 - 5.2 g/dL  Urine microalbumin-creatinine with uACR  Result Value Ref Range   Microalb, Ur 0.9 0.0 - 1.9 mg/dL   Creatinine,U 176.6 mg/dL   Microalb Creat Ratio 0.5 0.0 - 30.0 mg/g  Lipid panel  Result Value Ref Range   Cholesterol 133 0 - 200 mg/dL   Triglycerides 128.0 0.0 - 149.0 mg/dL   HDL 51.00 >39.00 mg/dL   VLDL 25.6 0.0 - 40.0 mg/dL   LDL Cholesterol 57 0 - 99 mg/dL   Total CHOL/HDL Ratio 3    NonHDL 82.14       Assessment & Plan:    Problem List Items Addressed This Visit       Digestive   Esophageal spasm   Relevant Medications   diazepam (VALIUM) 5 MG tablet (Start on 07/23/2022)     Endocrine   DM (diabetes mellitus) ([facility]) - Primary    Repeat hgbA1c, bmp and urine microabumin today: normal with hgbA1c at 5.8% improved Reports home fasting glucose: 90s-100s No medication use at this time. Advised to schedule appt for annual eye exam      Relevant Medications   atorvastatin (LIPITOR) 40 MG tablet   Other Relevant Orders   Hemoglobin A1c  (Completed)   Basic metabolic panel (Completed)   Urine microalbumin-creatinine with uACR (Completed)     Other   GAD (generalized anxiety disorder)    Stable mood but continues to struggle with insomnia (initiating and staying asleep) current use of cymbalta, valium and trazodone. With  hx of syncope, high fall risk and use of an opioid, I referred to psychiatry to help with medication management. Maintain current medication doses      Relevant Medications   diazepam (VALIUM) 5 MG tablet (Start on 07/23/2022)   DULoxetine (CYMBALTA) 60 MG capsule   traZODone (DESYREL) 100 MG tablet   Other Relevant Orders   Ambulatory referral to Psychiatry   Major depressive disorder    Stable with wellbutrin      Relevant Medications   diazepam (VALIUM) 5 MG tablet (Start on 07/23/2022)   DULoxetine (CYMBALTA) 60 MG capsule   traZODone (DESYREL) 100 MG tablet   Mixed hyperlipidemia    Repeat lipid panel: LDL at goal Current use of lipitor with no adverse effects      Relevant Medications   atorvastatin (LIPITOR) 40 MG tablet   Other Relevant Orders   Ambulatory referral to Psychiatry   Hepatic function panel (Completed)   Lipid panel (Completed)   Syncope    No falls or syncopal episodes in last 30month      Other Visit Diagnoses     Insomnia, unspecified type       Relevant Orders   Ambulatory referral to Psychiatry   Need for Streptococcus pneumoniae vaccination       Relevant Orders   Pneumococcal conjugate vaccine 20-valent (Prevnar 20) (Completed)   Need for immunization against influenza       Relevant Orders   Flu Vaccine QUAD High Dose(Fluad) (Completed)   Hyperlipidemia, unspecified hyperlipidemia type       Relevant Medications   atorvastatin (LIPITOR) 40 MG tablet      Return in about 3 months (around 10/08/2022) for DM, depression and anxiety, hyperlipidemia (fasting).     CWilfred Lacy NP

## 2022-07-08 NOTE — Assessment & Plan Note (Addendum)
Repeat lipid panel: LDL at goal Current use of lipitor with no adverse effects

## 2022-07-08 NOTE — Assessment & Plan Note (Addendum)
Stable mood but continues to struggle with insomnia (initiating and staying asleep) current use of cymbalta, valium and trazodone. With hx of syncope, high fall risk and use of an opioid, I referred to psychiatry to help with medication management. Maintain current medication doses

## 2022-07-08 NOTE — Assessment & Plan Note (Addendum)
Repeat hgbA1c, bmp and urine microabumin today: normal with hgbA1c at 5.8% improved Reports home fasting glucose: 90s-100s No medication use at this time. Advised to schedule appt for annual eye exam

## 2022-07-08 NOTE — Assessment & Plan Note (Signed)
Stable with wellbutrin

## 2022-07-08 NOTE — Assessment & Plan Note (Signed)
No falls or syncopal episodes in last 29month

## 2022-07-09 ENCOUNTER — Telehealth: Payer: Self-pay | Admitting: Nurse Practitioner

## 2022-07-09 DIAGNOSIS — L739 Follicular disorder, unspecified: Secondary | ICD-10-CM

## 2022-07-09 MED ORDER — SULFAMETHOXAZOLE-TRIMETHOPRIM 800-160 MG PO TABS: 1.0000 | ORAL_TABLET | Freq: Two times a day (BID) | ORAL | 0 refills | Status: AC

## 2022-07-09 NOTE — Telephone Encounter (Signed)
Pt says she has a bacterial infection and was told yesterday by PCP an antibiotic would be sent to:  [facility] ([address] [place] [address] ([place]) [place] [address] Phone: [phone]  Fax: [phone]   Pt [phone]

## 2022-07-09 NOTE — Telephone Encounter (Signed)
Pt advised.

## 2022-07-10 ENCOUNTER — Ambulatory Visit
Admission: RE | Admit: 2022-07-10 | Discharge: 2022-07-10 | Disposition: A | Discharge status: Home / Self Care | Payer: Medicare Other | Source: Ambulatory Visit | Attending: Specialist | Admitting: Specialist

## 2022-07-10 DIAGNOSIS — M25512 Pain in left shoulder: Secondary | ICD-10-CM

## 2022-07-10 DIAGNOSIS — M19012 Primary osteoarthritis, left shoulder: Secondary | ICD-10-CM | POA: Diagnosis not present

## 2022-07-10 MED ORDER — IOPAMIDOL (ISOVUE-M 300) INJECTION 61%
15.0000 mL | Freq: Once | INTRAMUSCULAR | Status: AC | PRN
  Administered 2022-07-10: 15 mL via INTRA_ARTICULAR

## 2022-07-10 MED ORDER — IOPAMIDOL (ISOVUE-M 300) INJECTION 61%: 15.0000 mL | Freq: Once | INTRAMUSCULAR | Status: DC | PRN

## 2022-07-15 ENCOUNTER — Telehealth: Payer: Self-pay | Admitting: Nurse Practitioner

## 2022-07-15 DIAGNOSIS — L739 Follicular disorder, unspecified: Secondary | ICD-10-CM

## 2022-07-15 MED ORDER — MUPIROCIN 2 % EX OINT: 1.0000 | TOPICAL_OINTMENT | Freq: Two times a day (BID) | CUTANEOUS | 0 refills | Status: AC

## 2022-07-15 NOTE — Telephone Encounter (Signed)
Caller Name: Gwynne Kemnitz Call back phone #: [phone]  Reason for Call: Pt was recently seen for a bacterial infection. She is asking for another prescription of the antibiotics. She believes it is almost gone.

## 2022-07-15 NOTE — Telephone Encounter (Signed)
Pt advised.

## 2022-07-17 ENCOUNTER — Other Ambulatory Visit: Payer: Self-pay | Admitting: Nurse Practitioner

## 2022-07-17 DIAGNOSIS — K224 Dyskinesia of esophagus: Secondary | ICD-10-CM

## 2022-07-22 DIAGNOSIS — M47812 Spondylosis without myelopathy or radiculopathy, cervical region: Secondary | ICD-10-CM | POA: Diagnosis not present

## 2022-07-22 DIAGNOSIS — G894 Chronic pain syndrome: Secondary | ICD-10-CM | POA: Diagnosis not present

## 2022-07-22 DIAGNOSIS — M62830 Muscle spasm of back: Secondary | ICD-10-CM | POA: Diagnosis not present

## 2022-07-22 DIAGNOSIS — M47817 Spondylosis without myelopathy or radiculopathy, lumbosacral region: Secondary | ICD-10-CM | POA: Diagnosis not present

## 2022-07-23 ENCOUNTER — Other Ambulatory Visit: Payer: Self-pay | Admitting: Nurse Practitioner

## 2022-07-23 DIAGNOSIS — K224 Dyskinesia of esophagus: Secondary | ICD-10-CM

## 2022-07-23 NOTE — Telephone Encounter (Signed)
Chart supports Rx Last OV: 06/2022 Next OV: 09/2022

## 2022-08-15 ENCOUNTER — Other Ambulatory Visit: Payer: Self-pay | Admitting: Nurse Practitioner

## 2022-08-15 DIAGNOSIS — K224 Dyskinesia of esophagus: Secondary | ICD-10-CM

## 2022-08-15 MED ORDER — DIAZEPAM 5 MG PO TABS: 5.0000 mg | ORAL_TABLET | Freq: Every day | ORAL | 0 refills | Status: DC

## 2022-08-19 ENCOUNTER — Other Ambulatory Visit: Payer: Self-pay | Admitting: Nurse Practitioner

## 2022-08-19 DIAGNOSIS — K224 Dyskinesia of esophagus: Secondary | ICD-10-CM

## 2022-08-20 DIAGNOSIS — M47812 Spondylosis without myelopathy or radiculopathy, cervical region: Secondary | ICD-10-CM | POA: Diagnosis not present

## 2022-08-20 DIAGNOSIS — M62830 Muscle spasm of back: Secondary | ICD-10-CM | POA: Diagnosis not present

## 2022-08-20 DIAGNOSIS — M47817 Spondylosis without myelopathy or radiculopathy, lumbosacral region: Secondary | ICD-10-CM | POA: Diagnosis not present

## 2022-08-20 DIAGNOSIS — G894 Chronic pain syndrome: Secondary | ICD-10-CM | POA: Diagnosis not present

## 2022-08-22 ENCOUNTER — Telehealth: Payer: Self-pay

## 2022-08-22 NOTE — Progress Notes (Signed)
Care Management & Coordination Services Pharmacy Team  Reason for Encounter: Diabetes  Contacted patient on 08/22/2022 to discuss diabetes disease state.   Recent office visits:  07/08/2022 Julie Brooke NP (PCP) Start Diazepam 5 mg daily on 07/23/2022,Start Bactrim 800-160 mg 2 times daily, Ambulatory referral to Psychiatry , Return in about 3 months   Recent consult visits:  None ID  Hospital visits:  None in previous 6 months  Medications: Outpatient Encounter Medications as of 08/22/2022  Medication Sig Note   ACCU-CHEK GUIDE test strip USE   TO CHECK GLUCOSE ONCE DAILY BEFORE BREAKFAST    atorvastatin (LIPITOR) 40 MG tablet Take 1 tablet (40 mg total) by mouth daily.    blood glucose meter kit and supplies Dispense based on patient and insurance preference. Once a day, before breakfast (FOR ICD-10 E11.65).    buPROPion (WELLBUTRIN XL) 300 MG 24 hr tablet Take 1 tablet by mouth once daily    [START ON 08/23/2022] diazepam (VALIUM) 5 MG tablet Take 1 tablet (5 mg total) by mouth daily.    DULoxetine (CYMBALTA) 60 MG capsule Take 1 capsule (60 mg total) by mouth daily.    mupirocin ointment (BACTROBAN) 2 % Apply 1 Application topically 2 (two) times daily.    naloxone (NARCAN) nasal spray 4 mg/0.1 mL SMARTSIG:Spray(s) In Nostril    oxyCODONE-acetaminophen (PERCOCET) 10-325 MG tablet Take 1 tablet by mouth every 6 (six) hours as needed. 06/05/2022: Prescribed by Margaretha Sheffield, MD   promethazine (PHENERGAN) 25 MG tablet Take 1 tablet (25 mg total) by mouth every 8 (eight) hours as needed for nausea or vomiting.    sulfamethoxazole-trimethoprim (BACTRIM DS) 800-160 MG tablet Take 1 tablet by mouth 2 (two) times daily.    SUMAtriptan (IMITREX) 50 MG tablet Take 1 tablet (50 mg total) by mouth once as needed for up to 1 dose for migraine (may repeat x 1 tab in 1-2hrs if headache persists). May repeat in 2 hours if headache persists or recurs.    tizanidine (ZANAFLEX) 2 MG capsule Take 2 mg  by mouth in the morning, at noon, in the evening, and at bedtime. 06/05/2022: Prescribed by Margaretha Sheffield, MD   traZODone (DESYREL) 100 MG tablet Take 1 tablet (100 mg total) by mouth at bedtime.    No facility-administered encounter medications on file as of 08/22/2022.    Recent Relevant Labs: Lab Results  Component Value Date/Time   HGBA1C 5.8 07/08/2022 08:34 AM   HGBA1C 6.6 (A) 01/29/2022 11:51 AM   HGBA1C 6.4 06/26/2021 10:11 AM   MICROALBUR 0.9 07/08/2022 08:34 AM    Kidney Function Lab Results  Component Value Date/Time   CREATININE 1.05 07/08/2022 08:34 AM   CREATININE 1.26 (H) 01/15/2022 10:45 AM   CREATININE 0.96 09/22/2019 12:17 PM   CREATININE 0.97 06/01/2017 10:02 AM   GFR 55.15 (L) 07/08/2022 08:34 AM   GFRNONAA 47 (L) 01/15/2022 10:45 AM   GFRNONAA 62 09/22/2019 12:17 PM   GFRAA 72 09/22/2019 12:17 PM    Current antihyperglycemic regimen:  None ID   Patient verbally confirms she is taking the above medications as directed.   Patient does not take anything for Diabetes.  What diet changes have been made to improve diabetes control?  What recent interventions/DTPs have been made to improve glycemic control:  None ID  Have there been any recent hospitalizations or ED visits since last visit with PharmD? No  Patient denies hypoglycemic symptoms, including Pale, Sweaty, Shaky, Hungry, Nervous/irritable, and Vision changes  Patient denies  hyperglycemic symptoms, including blurry vision, excessive thirst, fatigue, polyuria, and weakness  How often are you checking your blood sugar? once daily  What are your blood sugars ranging?  Patient states her blood sugar ranges from 73 to 130.Patient denies her blood sugar dropping below 70.  During the week, how often does your blood glucose drop below 70? Never  Are you checking your feet daily/regularly? Yes, patient denies any pain,numbness or tingling sensation in her feet.   Adherence Review: Is the patient  currently on a STATIN medication? Yes Is the patient currently on ACE/ARB medication? No Does the patient have >5 day gap between last estimated fill dates? No  Care Gaps: LTEIH-53 Vaccine Ophthalmology Exam AWV   Star Rating Drugs: Atorvastatin 40 mg last filled 07/23/2022 for 90 day supply at [facility].    [place] Pharmacist Assistant [phone]

## 2022-08-25 ENCOUNTER — Ambulatory Visit (HOSPITAL_COMMUNITY): Payer: 59 | Admitting: Psychiatry

## 2022-09-15 ENCOUNTER — Other Ambulatory Visit: Payer: Self-pay | Admitting: Nurse Practitioner

## 2022-09-15 DIAGNOSIS — K224 Dyskinesia of esophagus: Secondary | ICD-10-CM

## 2022-09-15 MED ORDER — DIAZEPAM 5 MG PO TABS: 5.0000 mg | ORAL_TABLET | Freq: Every day | ORAL | 2 refills | Status: DC

## 2022-09-18 DIAGNOSIS — G894 Chronic pain syndrome: Secondary | ICD-10-CM | POA: Diagnosis not present

## 2022-09-18 DIAGNOSIS — M62830 Muscle spasm of back: Secondary | ICD-10-CM | POA: Diagnosis not present

## 2022-09-18 DIAGNOSIS — M47817 Spondylosis without myelopathy or radiculopathy, lumbosacral region: Secondary | ICD-10-CM | POA: Diagnosis not present

## 2022-09-18 DIAGNOSIS — M47812 Spondylosis without myelopathy or radiculopathy, cervical region: Secondary | ICD-10-CM | POA: Diagnosis not present

## 2022-09-22 ENCOUNTER — Encounter (HOSPITAL_COMMUNITY): Payer: Self-pay

## 2022-09-22 ENCOUNTER — Ambulatory Visit (HOSPITAL_COMMUNITY): Payer: 59 | Admitting: Psychiatry

## 2022-09-23 ENCOUNTER — Telehealth: Payer: Self-pay | Admitting: Nurse Practitioner

## 2022-09-23 NOTE — Telephone Encounter (Signed)
Left message for patient to call back and schedule Medicare Annual Wellness Visit (AWV) either virtually or in office. Left  my Julie Neal number [phone]   Last AWV 11/20/20 please schedule with Nurse Health Adviser  30 min for all AWV appointments

## 2022-10-06 ENCOUNTER — Ambulatory Visit (INDEPENDENT_AMBULATORY_CARE_PROVIDER_SITE_OTHER): Payer: 59

## 2022-10-06 VITALS — Ht 66.0 in | Wt 140.0 lb

## 2022-10-06 DIAGNOSIS — Z0000 Encounter for general adult medical examination without abnormal findings: Secondary | ICD-10-CM

## 2022-10-06 NOTE — Progress Notes (Signed)
I connected with  Julie Neal on 10/06/22 by a audio enabled telemedicine application and verified that I am speaking with the correct person using two identifiers.  Patient Location: Home  Provider Location: Office/Clinic  I discussed the limitations of evaluation and management by telemedicine. The patient expressed understanding and agreed to proceed.  Subjective:   Julie Neal is a 67 y.o. female who presents for Medicare Annual (Subsequent) preventive examination.  Review of Systems     Cardiac Risk Factors include: advanced age (>56mn, >>55women);diabetes mellitus;dyslipidemia     Objective:    Today's Vitals   10/06/22 1026 10/06/22 1027  Weight: 140 lb (63.5 kg)   Height: '5\' 6"'$  (1.676 m)   PainSc:  8    Body mass index is 22.6 kg/m.     10/06/2022   10:34 AM 01/15/2022    1:21 PM 01/15/2022   10:26 AM 11/20/2020    9:49 AM 10/16/2014    2:17 PM 08/19/2014   10:31 PM  Advanced Directives  Does Patient Have a Medical Advance Directive? No No No No  No  Would patient like information on creating a medical advance directive?    Yes (MAU/Ambulatory/Procedural Areas - Information given)  No - patient declined information     Information is confidential and restricted. Go to Review Flowsheets to unlock data.    Current Medications (verified) Outpatient Encounter Medications as of 10/06/2022  Medication Sig   atorvastatin (LIPITOR) 40 MG tablet Take 1 tablet (40 mg total) by mouth daily.   buPROPion (WELLBUTRIN XL) 300 MG 24 hr tablet Take 1 tablet by mouth once daily   diazepam (VALIUM) 5 MG tablet Take 1 tablet (5 mg total) by mouth daily.   DULoxetine (CYMBALTA) 60 MG capsule Take 1 capsule (60 mg total) by mouth daily.   morphine (MS CONTIN) 15 MG 12 hr tablet Take 15 mg by mouth every 8 (eight) hours.   naloxone (NARCAN) nasal spray 4 mg/0.1 mL SMARTSIG:Spray(s) In Nostril   SUMAtriptan (IMITREX) 50 MG tablet Take 1 tablet (50 mg total) by mouth once as needed  for up to 1 dose for migraine (may repeat x 1 tab in 1-2hrs if headache persists). May repeat in 2 hours if headache persists or recurs.   tizanidine (ZANAFLEX) 2 MG capsule Take 2 mg by mouth in the morning, at noon, in the evening, and at bedtime.   traZODone (DESYREL) 100 MG tablet Take 1 tablet (100 mg total) by mouth at bedtime.   ACCU-CHEK GUIDE test strip USE   TO CHECK GLUCOSE ONCE DAILY BEFORE BREAKFAST (Patient not taking: Reported on 10/06/2022)   blood glucose meter kit and supplies Dispense based on patient and insurance preference. Once a day, before breakfast (FOR ICD-10 E11.65). (Patient not taking: Reported on 10/06/2022)   mupirocin ointment (BACTROBAN) 2 % Apply 1 Application topically 2 (two) times daily. (Patient not taking: Reported on 10/06/2022)   oxyCODONE-acetaminophen (PERCOCET) 10-325 MG tablet Take 1 tablet by mouth every 6 (six) hours as needed. (Patient not taking: Reported on 10/06/2022)   promethazine (PHENERGAN) 25 MG tablet Take 1 tablet (25 mg total) by mouth every 8 (eight) hours as needed for nausea or vomiting. (Patient not taking: Reported on 10/06/2022)   sulfamethoxazole-trimethoprim (BACTRIM DS) 800-160 MG tablet Take 1 tablet by mouth 2 (two) times daily. (Patient not taking: Reported on 10/06/2022)   No facility-administered encounter medications on file as of 10/06/2022.    Allergies (verified) Mobic [meloxicam], Penicillins, and Nubain [  nalbuphine hcl]   History: Past Medical History:  Diagnosis Date   Anxiety    Asymptomatic stenosis of right carotid artery    1-30%   Cervicalgia    Depression    Depression, recurrent (HCC)    Fibromyalgia    Fracture of ankle, bimalleolar, left, closed [id number]   Helicobacter pylori gastritis 11/13/2016   Hyperlipidemia    Left leg weakness    Lumbago    Memory loss    Migraines    Neuralgic migraines    Osteoporosis    Past Surgical History:  Procedure Laterality Date   ABDOMINAL HYSTERECTOMY     BACK  SURGERY     HAND SURGERY Bilateral    LEG SURGERY Bilateral    NECK SURGERY     SHOULDER SURGERY Left    Family History  Problem Relation Age of Onset   Heart attack Father    Heart disease Father 60   Alzheimer's disease Mother    Stroke Mother    Diabetes type II Brother    Breast cancer Sister    Social History   Socioeconomic History   Marital status: Widowed    Spouse name: [name]   Number of children: 1   Years of education: college   Highest education level: Not on file  Occupational History   Occupation: Retired    Comment: disabled  Tobacco Use   Smoking status: Former    Types: Cigarettes   Smokeless tobacco: Never   Tobacco comments:    Quit in [place] Use: Former  Substance and Sexual Activity   Alcohol use: No    Alcohol/week: 0.0 standard drinks of alcohol   Drug use: Yes    Types: Morphine   Sexual activity: Not on file  Other Topics Concern   Not on file  Social History Narrative   Has a college education. Husband died 2019/11/14 (pulmonary fibrosis).  Patient has one child.   Patient is disabled. Right handed.   Caffeine one cup of coffee.   Social Determinants of Health   Financial Resource Strain: Low Risk  (10/06/2022)   Overall Financial Resource Strain (CARDIA)    Difficulty of Paying Living Expenses: Not hard at all  Food Insecurity: No Food Insecurity (10/06/2022)   Hunger Vital Sign    Worried About Running Out of Food in the Last Year: Never true    Ran Out of Food in the Last Year: Never true  Transportation Needs: No Transportation Needs (10/06/2022)   PRAPARE - Hydrologist (Medical): No    Lack of Transportation (Non-Medical): No  Physical Activity: Inactive (10/06/2022)   Exercise Vital Sign    Days of Exercise per Week: 0 days    Minutes of Exercise per Session: 0 min  Stress: No Stress Concern Present (10/06/2022)   [place]    Feeling of Stress : Not at all  Social Connections: Moderately Isolated (11/20/2020)   Social Connection and Isolation Panel [NHANES]    Frequency of Communication with Friends and Family: More than three times a week    Frequency of Social Gatherings with Friends and Family: More than three times a week    Attends Religious Services: More than 4 times per year    Active Member of [employer] or Organizations: No    Attends Archivist Meetings: Never    Marital Status: Widowed    Tobacco Counseling  Counseling given: Not Answered Tobacco comments: Quit in 1988   Clinical Intake:  Pre-visit preparation completed: Yes  Pain : 0-10 Pain Score: 8  Pain Type: Chronic pain Pain Location: Back Pain Orientation: Lower Pain Radiating Towards: down left leg Pain Descriptors / Indicators: Shooting Pain Onset: More than a month ago Pain Frequency: Intermittent     Nutritional Status: BMI of 19-24  Normal Nutritional Risks: None Diabetes: Yes  How often do you need to have someone help you when you read instructions, pamphlets, or other written materials from your doctor or pharmacy?: 1 - Never  Diabetic? Yes Nutrition Risk Assessment:  Has the patient had any N/V/D within the last 2 months?  No  Does the patient have any non-healing wounds?  No  Has the patient had any unintentional weight loss or weight gain?  No   Diabetes:  Is the patient diabetic?  Yes  If diabetic, was a CBG obtained today?  No  Did the patient bring in their glucometer from home?  No  How often do you monitor your CBG's? Does not.   Financial Strains and Diabetes Management:  Are you having any financial strains with the device, your supplies or your medication? No .  Does the patient want to be seen by Chronic Care Management for management of their diabetes?  No  Would the patient like to be referred to a Nutritionist or for Diabetic Management?  No   Diabetic  Exams:  Diabetic Eye Exam: Overdue for diabetic eye exam. Pt has been advised about the importance in completing this exam. Patient advised to call and schedule an eye exam. Diabetic Foot Exam: Completed 03/06/2022   Interpreter Needed?: No  Information entered by :: NAllen LPN   Activities of Daily Living    10/06/2022   10:37 AM  In your present state of health, do you have any difficulty performing the following activities:  Hearing? 0  Vision? 0  Difficulty concentrating or making decisions? 0  Walking or climbing stairs? 0  Dressing or bathing? 0  Doing errands, shopping? 0  Preparing Food and eating ? N  Using the Toilet? N  In the past six months, have you accidently leaked urine? Y  Comment urinary incontinence  Do you have problems with loss of bowel control? N  Managing your Medications? N  Managing your Finances? N  Housekeeping or managing your Housekeeping? N    Patient Care Team: Nche, Charlene Brooke, NP as PCP - General (Internal Medicine) Melina Schools, MD as Referring Physician (Orthopedic Surgery) Germaine Pomfret, [facility] as Pharmacist (Pharmacist) Margaretha Sheffield, MD as Referring Physician (Physical Medicine and Rehabilitation)  Indicate any recent Medical Services you may have received from other than [facility] providers in the past year (date may be approximate).     Assessment:   This is a routine wellness examination for Julie Neal.  Hearing/Vision screen Vision Screening - Comments:: No regular eye exams, [facility]  Dietary issues and exercise activities discussed: Current Exercise Habits: The patient does not participate in regular exercise at present   Goals Addressed             This Visit's Progress    Patient Stated       10/06/2022, no goals       Depression Screen    10/06/2022   10:36 AM 07/08/2022    8:31 AM 03/06/2022    9:24 AM 01/29/2022    1:05 PM 06/26/2021   10:14 AM 12/24/2020   10:06  AM 11/20/2020    9:51 AM  PHQ 2/9 Scores   PHQ - 2 Score 0 0 0 4 1 0 0  PHQ- 9 Score  6 0 '8 4 3     '$ Fall Risk    10/06/2022   10:36 AM 01/29/2022   11:18 AM 06/26/2021    9:41 AM 01/29/2021    8:20 AM 11/20/2020    9:50 AM  Fall Risk   Falls in the past year? 0 '1 1 1 1  '$ Comment    last fall in June 2022 with no injury.   Number falls in past yr: 0 '1 1 1 1  '$ Injury with Fall? 0 1 1 0 0  Risk for fall due to : Medication side effect;Impaired mobility;Impaired balance/gait  History of fall(s)  History of fall(s);Impaired balance/gait  Follow up Falls prevention discussed;Education provided;Falls evaluation completed  Falls evaluation completed  Falls prevention discussed    FALL RISK PREVENTION PERTAINING TO THE HOME:  Any stairs in or around the home? Yes  If so, are there any without handrails? No  Home free of loose throw rugs in walkways, pet beds, electrical cords, etc? Yes  Adequate lighting in your home to reduce risk of falls? Yes   ASSISTIVE DEVICES UTILIZED TO PREVENT FALLS:  Life alert? No  Use of a cane, walker or w/c? Yes  Grab bars in the bathroom?  yes Shower chair or bench in shower? No  Elevated toilet seat or a handicapped toilet? No   TIMED UP AND GO:  Was the test performed? No .      Cognitive Function:    01/29/2021    8:25 AM  MMSE - Mini Mental State Exam  Orientation to time 5  Orientation to Place 5  Registration 3  Attention/ Calculation 3  Recall 3  Language- name 2 objects 2  Language- repeat 1  Language- follow 3 step command 3  Language- read & follow direction 1  Write a sentence 1  Copy design 1  Total score 28        10/06/2022   10:38 AM  6CIT Screen  What Year? 0 points  What month? 0 points  What time? 0 points  Count back from 20 0 points  Months in reverse 4 points  Repeat phrase 8 points  Total Score 12 points    Immunizations Immunization History  Administered Date(s) Administered   Fluad Quad(high Dose 65+) 06/26/2020, 06/26/2021, 07/08/2022    Influenza, High Dose Seasonal PF 05/31/2018   Influenza,inj,Quad PF,6+ Mos 07/18/2014, 07/23/2015, 05/23/2016, 05/15/2017   Influenza-Unspecified 05/12/2019   PFIZER(Purple Top)SARS-COV-2 Vaccination 11/10/2019, 12/05/2019, 06/11/2020   PNEUMOCOCCAL CONJUGATE-20 07/08/2022   Pneumococcal Conjugate-13 06/26/2021   Tdap 07/18/2014, 05/16/2016, 12/27/2021   Zoster Recombinat (Shingrix) 10/16/2020, 03/26/2021    TDAP status: Up to date  Flu Vaccine status: Up to date  Pneumococcal vaccine status: Up to date  Covid-19 vaccine status: Completed vaccines  Qualifies for Shingles Vaccine? Yes   Zostavax completed Yes   Shingrix Completed?: Yes  Screening Tests Health Maintenance  Topic Date Due   OPHTHALMOLOGY EXAM  Never done   Medicare Annual Wellness (AWV)  11/20/2021   COVID-19 Vaccine (4 - 2023-24 season) 04/11/2022   HEMOGLOBIN A1C  01/06/2023   MAMMOGRAM  01/20/2023   FOOT EXAM  03/07/2023   Diabetic kidney evaluation - eGFR measurement  07/09/2023   Diabetic kidney evaluation - Urine ACR  07/09/2023   COLONOSCOPY (Pts 45-76yr Insurance coverage will need to  be confirmed)  03/20/2030   DTaP/Tdap/Td (4 - Td or Tdap) 12/28/2031   Pneumonia Vaccine 72+ Years old  Completed   INFLUENZA VACCINE  Completed   DEXA SCAN  Completed   Hepatitis C Screening  Completed   Zoster Vaccines- Shingrix  Completed   HPV VACCINES  Aged Out    Health Maintenance  Health Maintenance Due  Topic Date Due   OPHTHALMOLOGY EXAM  Never done   Medicare Annual Wellness (AWV)  11/20/2021   COVID-19 Vaccine (4 - 2023-24 season) 04/11/2022    Colorectal cancer screening: Type of screening: Colonoscopy. Completed 03/20/2020. Repeat every 10 years  Mammogram status: Completed 01/19/2021. Repeat every 2 years  Bone Density status: Completed 12/09/2019.   Lung Cancer Screening: (Low Dose CT Chest recommended if Age 29-80 years, 30 pack-year currently smoking OR have quit w/in 15years.) does not  qualify.   Lung Cancer Screening Referral: no  Additional Screening:  Hepatitis C Screening: does qualify; Completed 10/20/2016  Vision Screening: Recommended annual ophthalmology exams for early detection of glaucoma and other disorders of the eye. Is the patient up to date with their annual eye exam?  No  Who is the provider or what is the name of the office in which the patient attends annual eye exams? [facility] If pt is not established with a provider, would they like to be referred to a provider to establish care? No .   Dental Screening: Recommended annual dental exams for proper oral hygiene  Community Resource Referral / Chronic Care Management: CRR required this visit?  No   CCM required this visit?  No      Plan:     I have personally reviewed and noted the following in the patient's chart:   Medical and social history Use of alcohol, tobacco or illicit drugs  Current medications and supplements including opioid prescriptions. Patient is not currently taking opioid prescriptions. Functional ability and status Nutritional status Physical activity Advanced directives List of other physicians Hospitalizations, surgeries, and ER visits in previous 12 months Vitals Screenings to include cognitive, depression, and falls Referrals and appointments  In addition, I have reviewed and discussed with patient certain preventive protocols, quality metrics, and best practice recommendations. A written personalized care plan for preventive services as well as general preventive health recommendations were provided to patient.     Kellie Simmering, LPN   [id number]   Nurse Notes: none  Due to this being a virtual visit, the after visit summary with patients personalized plan was offered to patient via mail or my-chart.  to pick up at office at next visit

## 2022-10-06 NOTE — Patient Instructions (Signed)
Julie Neal , Thank you for taking time to come for your Medicare Wellness Visit. I appreciate your ongoing commitment to your health goals. Please review the following plan we discussed and let me know if I can assist you in the future.   These are the goals we discussed:  Goals      Patient Stated     Increase activity, drink more water & eat healthier     Patient Stated     10/06/2022, no goals     Track and Manage My Symptoms-Depression     Timeframe:  Long-Range Goal Priority:  High Start Date:  05/10/21                            Expected End Date: 05/11/2023                      Follow Up within 90 days   - avoid negative self-talk - exercise at least 2 to 3 times per week - have a plan for how to handle bad days - spend time or talk with others at least 2 to 3 times per week - watch for early signs of feeling worse    Why is this important?   Keeping track of your progress will help your treatment team find the right mix of medicine and therapy for you.  Write in your journal every day.  Day-to-day changes in depression symptoms are normal. It may be more helpful to check your progress at the end of each week instead of every day.     Notes:         This is a list of the screening recommended for you and due dates:  Health Maintenance  Topic Date Due   Eye exam for diabetics  Never done   COVID-19 Vaccine (4 - 2023-24 season) 04/11/2022   Hemoglobin A1C  01/06/2023   Mammogram  01/20/2023   Complete foot exam   03/07/2023   Yearly kidney function blood test for diabetes  07/09/2023   Yearly kidney health urinalysis for diabetes  07/09/2023   Medicare Annual Wellness Visit  10/07/2023   Colon Cancer Screening  03/20/2030   DTaP/Tdap/Td vaccine (4 - Td or Tdap) 12/28/2031   Pneumonia Vaccine  Completed   Flu Shot  Completed   DEXA scan (bone density measurement)  Completed   Hepatitis C Screening: USPSTF Recommendation to screen - Ages 78-79 yo.  Completed    Zoster (Shingles) Vaccine  Completed   HPV Vaccine  Aged Out    Advanced directives: Advance directive discussed with you today.   Conditions/risks identified: none  Next appointment: Follow up in one year for your annual wellness visit    Preventive Care 65 Years and Older, Female Preventive care refers to lifestyle choices and visits with your health care provider that can promote health and wellness. What does preventive care include? A yearly physical exam. This is also called an annual well check. Dental exams once or twice a year. Routine eye exams. Ask your health care provider how often you should have your eyes checked. Personal lifestyle choices, including: Daily care of your teeth and gums. Regular physical activity. Eating a healthy diet. Avoiding tobacco and drug use. Limiting alcohol use. Practicing safe sex. Taking low-dose aspirin every day. Taking vitamin and mineral supplements as recommended by your health care provider. What happens during an annual well check? The services and screenings  done by your health care provider during your annual well check will depend on your age, overall health, lifestyle risk factors, and family history of disease. Counseling  Your health care provider may ask you questions about your: Alcohol use. Tobacco use. Drug use. Emotional well-being. Home and relationship well-being. Sexual activity. Eating habits. History of falls. Memory and ability to understand (cognition). Work and work Statistician. Reproductive health. Screening  You may have the following tests or measurements: Height, weight, and BMI. Blood pressure. Lipid and cholesterol levels. These may be checked every 5 years, or more frequently if you are over 5 years old. Skin check. Lung cancer screening. You may have this screening every year starting at age 38 if you have a 30-pack-year history of smoking and currently smoke or have quit within the past 15  years. Fecal occult blood test (FOBT) of the stool. You may have this test every year starting at age 7. Flexible sigmoidoscopy or colonoscopy. You may have a sigmoidoscopy every 5 years or a colonoscopy every 10 years starting at age 64. Hepatitis C blood test. Hepatitis B blood test. Sexually transmitted disease (STD) testing. Diabetes screening. This is done by checking your blood sugar (glucose) after you have not eaten for a while (fasting). You may have this done every 1-3 years. Bone density scan. This is done to screen for osteoporosis. You may have this done starting at age 33. Mammogram. This may be done every 1-2 years. Talk to your health care provider about how often you should have regular mammograms. Talk with your health care provider about your test results, treatment options, and if necessary, the need for more tests. Vaccines  Your health care provider may recommend certain vaccines, such as: Influenza vaccine. This is recommended every year. Tetanus, diphtheria, and acellular pertussis (Tdap, Td) vaccine. You may need a Td booster every 10 years. Zoster vaccine. You may need this after age 41. Pneumococcal 13-valent conjugate (PCV13) vaccine. One dose is recommended after age 22. Pneumococcal polysaccharide (PPSV23) vaccine. One dose is recommended after age 99. Talk to your health care provider about which screenings and vaccines you need and how often you need them. This information is not intended to replace advice given to you by your health care provider. Make sure you discuss any questions you have with your health care provider. Document Released: 08/24/2015 Document Revised: 04/16/2016 Document Reviewed: 05/29/2015 Elsevier Interactive Patient Education  2017 [employer] Prevention in the Home Falls can cause injuries. They can happen to people of all ages. There are many things you can do to make your home safe and to help prevent falls. What can I do on  the outside of my home? Regularly fix the edges of walkways and driveways and fix any cracks. Remove anything that might make you trip as you walk through a door, such as a raised step or threshold. Trim any bushes or trees on the path to your home. Use bright outdoor lighting. Clear any walking paths of anything that might make someone trip, such as rocks or tools. Regularly check to see if handrails are loose or broken. Make sure that both sides of any steps have handrails. Any raised decks and porches should have guardrails on the edges. Have any leaves, snow, or ice cleared regularly. Use sand or salt on walking paths during winter. Clean up any spills in your garage right away. This includes oil or grease spills. What can I do in the bathroom? Use night lights.  Install grab bars by the toilet and in the tub and shower. Do not use towel bars as grab bars. Use non-skid mats or decals in the tub or shower. If you need to sit down in the shower, use a plastic, non-slip stool. Keep the floor dry. Clean up any water that spills on the floor as soon as it happens. Remove soap buildup in the tub or shower regularly. Attach bath mats securely with double-sided non-slip rug tape. Do not have throw rugs and other things on the floor that can make you trip. What can I do in the bedroom? Use night lights. Make sure that you have a light by your bed that is easy to reach. Do not use any sheets or blankets that are too big for your bed. They should not hang down onto the floor. Have a firm chair that has side arms. You can use this for support while you get dressed. Do not have throw rugs and other things on the floor that can make you trip. What can I do in the kitchen? Clean up any spills right away. Avoid walking on wet floors. Keep items that you use a lot in easy-to-reach places. If you need to reach something above you, use a strong step stool that has a grab bar. Keep electrical cords out  of the way. Do not use floor polish or wax that makes floors slippery. If you must use wax, use non-skid floor wax. Do not have throw rugs and other things on the floor that can make you trip. What can I do with my stairs? Do not leave any items on the stairs. Make sure that there are handrails on both sides of the stairs and use them. Fix handrails that are broken or loose. Make sure that handrails are as long as the stairways. Check any carpeting to make sure that it is firmly attached to the stairs. Fix any carpet that is loose or worn. Avoid having throw rugs at the top or bottom of the stairs. If you do have throw rugs, attach them to the floor with carpet tape. Make sure that you have a light switch at the top of the stairs and the bottom of the stairs. If you do not have them, ask someone to add them for you. What else can I do to help prevent falls? Wear shoes that: Do not have high heels. Have rubber bottoms. Are comfortable and fit you well. Are closed at the toe. Do not wear sandals. If you use a stepladder: Make sure that it is fully opened. Do not climb a closed stepladder. Make sure that both sides of the stepladder are locked into place. Ask someone to hold it for you, if possible. Clearly mark and make sure that you can see: Any grab bars or handrails. First and last steps. Where the edge of each step is. Use tools that help you move around (mobility aids) if they are needed. These include: Canes. Walkers. Scooters. Crutches. Turn on the lights when you go into a dark area. Replace any light bulbs as soon as they burn out. Set up your furniture so you have a clear path. Avoid moving your furniture around. If any of your floors are uneven, fix them. If there are any pets around you, be aware of where they are. Review your medicines with your doctor. Some medicines can make you feel dizzy. This can increase your chance of falling. Ask your doctor what other things that  you  can do to help prevent falls. This information is not intended to replace advice given to you by your health care provider. Make sure you discuss any questions you have with your health care provider. Document Released: 05/24/2009 Document Revised: 01/03/2016 Document Reviewed: 09/01/2014 Elsevier Interactive Patient Education  2017 [employer].

## 2022-10-08 ENCOUNTER — Ambulatory Visit: Payer: 59 | Admitting: Nurse Practitioner

## 2022-10-08 ENCOUNTER — Telehealth: Payer: Self-pay | Admitting: Nurse Practitioner

## 2022-10-08 DIAGNOSIS — E782 Mixed hyperlipidemia: Secondary | ICD-10-CM

## 2022-10-08 DIAGNOSIS — F411 Generalized anxiety disorder: Secondary | ICD-10-CM

## 2022-10-08 DIAGNOSIS — F3342 Major depressive disorder, recurrent, in full remission: Secondary | ICD-10-CM

## 2022-10-08 DIAGNOSIS — E1165 Type 2 diabetes mellitus with hyperglycemia: Secondary | ICD-10-CM

## 2022-10-08 NOTE — Telephone Encounter (Signed)
Pt was a no show for an OV with [place] on 10/08/22, I sent a letter.

## 2022-10-15 ENCOUNTER — Other Ambulatory Visit: Payer: Self-pay | Admitting: Nurse Practitioner

## 2022-10-15 DIAGNOSIS — K224 Dyskinesia of esophagus: Secondary | ICD-10-CM

## 2022-10-16 NOTE — Addendum Note (Signed)
Addended by: Leana Gamer on: 10/16/2022 03:44 PM   Modules accepted: Orders

## 2022-10-20 ENCOUNTER — Other Ambulatory Visit: Payer: Self-pay | Admitting: Nurse Practitioner

## 2022-10-20 ENCOUNTER — Encounter: Payer: Self-pay | Admitting: Nurse Practitioner

## 2022-10-20 ENCOUNTER — Other Ambulatory Visit: Payer: Self-pay

## 2022-10-20 ENCOUNTER — Telehealth: Payer: Self-pay | Admitting: Nurse Practitioner

## 2022-10-20 NOTE — Telephone Encounter (Signed)
2nd no show, fee generated, final warning letter sent to reschedule/notify of policy 

## 2022-10-20 NOTE — Telephone Encounter (Signed)
[facility] called wants all active med orders sent to them.  Phone # [phone] Fax # [phone]

## 2022-10-28 NOTE — Telephone Encounter (Signed)
[facility] is still asking for this pts active meds to be faxed to them.

## 2022-11-14 ENCOUNTER — Other Ambulatory Visit: Payer: Self-pay | Admitting: Nurse Practitioner

## 2022-11-14 DIAGNOSIS — K224 Dyskinesia of esophagus: Secondary | ICD-10-CM

## 2022-11-14 NOTE — Telephone Encounter (Signed)
Discontinued by provider

## 2022-11-20 IMAGING — CT CT KNEE*L* W/O CM
3 series · 8 of 14 positions shown, 9 images · non-contrast
Comparison: None.

CLINICAL DATA: Status post fall.  Knee pain.

EXAM:
CT OF THE LEFT KNEE WITHOUT CONTRAST
TECHNIQUE: Multidetector CT imaging of the left knee was performed according to
the standard protocol. Multiplanar CT image reconstructions were
also generated.
RADIATION DOSE REDUCTION: This exam was performed according to the
departmental dose-optimization program which includes automated
exposure control, adjustment of the mA and/or kV according to
patient size and/or use of iterative reconstruction technique.

[Series 3: knee bone · axial · 0.43mm/px · z∈[-793,-670]mm · 3 of 83 slices shown, 4 images (1 of 2)]
[im 21/83  soft-tissue]
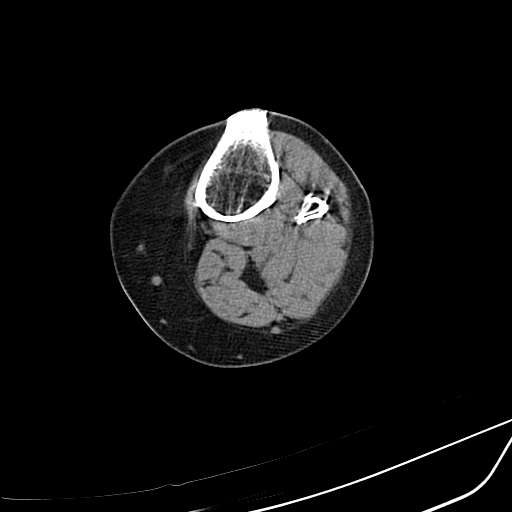
[im 21/83  bone]
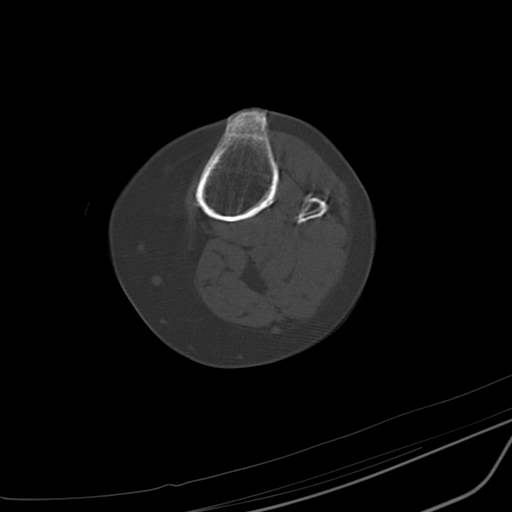
[im 42/83  bone]
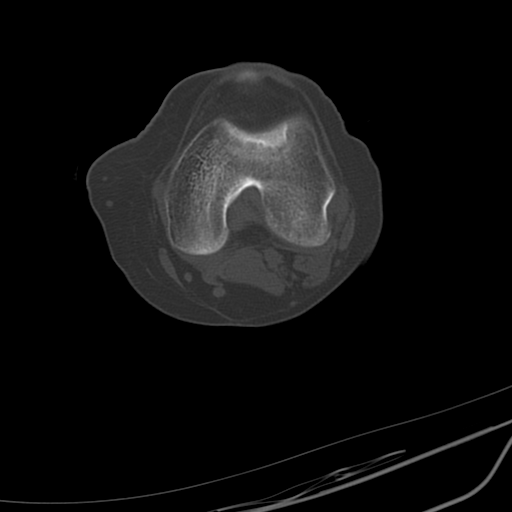
[im 62/83  bone]
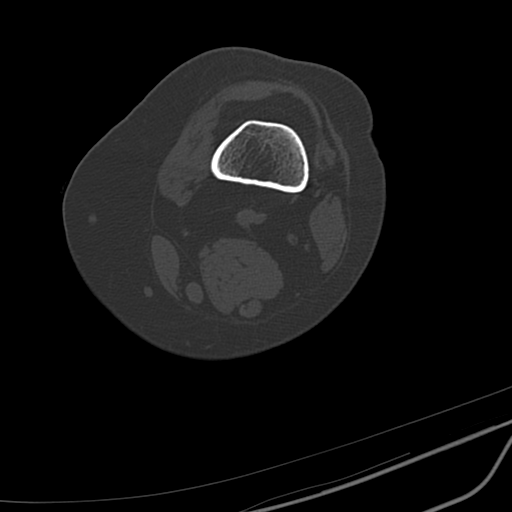

[Series 5: knee bone · axial · 0.48mm/px · z∈[-816,-708]mm · 3 of 73 slices shown (2 of 2)]
[im 19/73  bone]
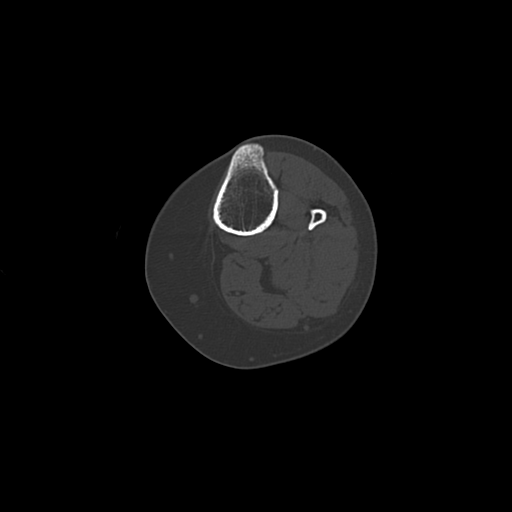
[im 37/73  bone]
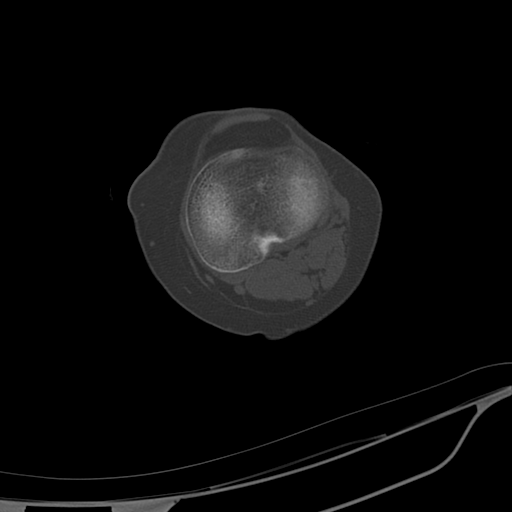
[im 55/73  bone]
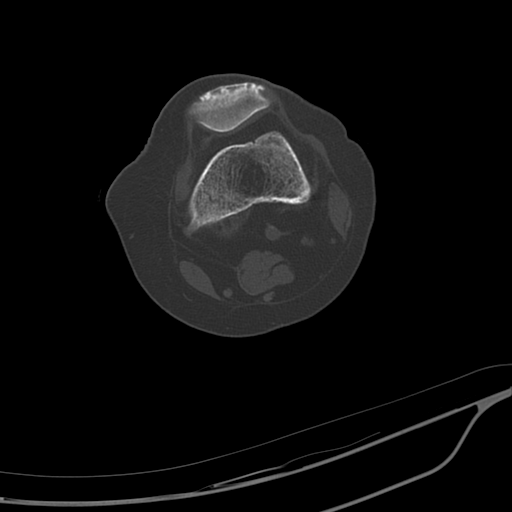

[Series 6: knee soft tissue · axial · 0.41mm/px · z∈[-795,-726]mm · 2 of 71 slices shown]
[im 24/71  soft-tissue]
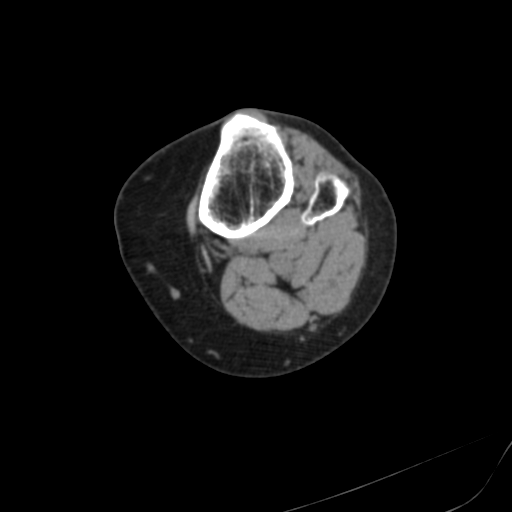
[im 47/71  soft-tissue]
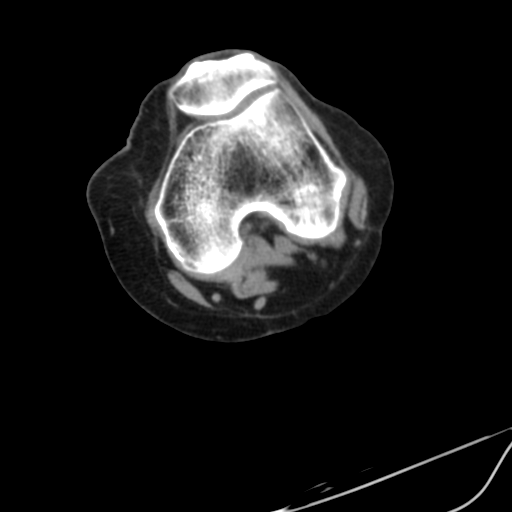

[8 of 14 positions shown; findings below may reference images not displayed]

FINDINGS: Bones/Joint/Cartilage

Generalized osteopenia. Linear area of subchondral sclerosis
involving the weight-bearing aspect of the medial femoral condyle,
lateral trochlea and to lesser extent posterior nonweightbearing
surface of the lateral femoral condyle which may reflect areas of
osteonecrosis or insufficiency fractures without displacement or
depression. No other acute fracture or dislocation. Normal
alignment. No joint effusion.

Ligaments

Ligaments are suboptimally evaluated by CT.

Muscles and Tendons
Muscles are normal. No muscle atrophy. No intramuscular fluid
collection or hematoma. Patellar tendon and quadriceps tendon are
intact.

Soft tissue
No fluid collection or hematoma.  No soft tissue mass.
IMPRESSION: 1. Linear area of subchondral sclerosis involving the weight-bearing
aspect of the medial femoral condyle, lateral trochlea and to lesser
extent posterior nonweightbearing surface of the lateral femoral
condyle which may reflect areas of osteonecrosis or insufficiency
fractures without displacement or depression.

## 2022-11-27 ENCOUNTER — Telehealth: Payer: Self-pay | Admitting: Nurse Practitioner

## 2022-11-27 NOTE — Telephone Encounter (Signed)
Pharmacy is calling again to get a PA on  Atorvastatin, duloxetine, trazodone, promethazine Please call [phone]

## 2022-12-09 ENCOUNTER — Other Ambulatory Visit: Payer: Self-pay | Admitting: Nurse Practitioner

## 2022-12-09 ENCOUNTER — Encounter: Payer: Self-pay | Admitting: Nurse Practitioner

## 2022-12-09 DIAGNOSIS — F17219 Nicotine dependence, cigarettes, with unspecified nicotine-induced disorders: Secondary | ICD-10-CM

## 2022-12-09 DIAGNOSIS — Z1231 Encounter for screening mammogram for malignant neoplasm of breast: Secondary | ICD-10-CM

## 2022-12-09 DIAGNOSIS — R059 Cough, unspecified: Secondary | ICD-10-CM

## 2022-12-17 ENCOUNTER — Other Ambulatory Visit: Payer: Self-pay

## 2022-12-17 NOTE — Telephone Encounter (Signed)
[facility] called wanting all of her medication.  Since there was no information on file that she uses this company we asked that they get a record release form from the patient.  I called [place] to verify if she heard of or uses [facility] and she does. The insurance company want's her to start using them.

## 2023-01-07 ENCOUNTER — Emergency Department (HOSPITAL_COMMUNITY): Payer: 59

## 2023-01-07 ENCOUNTER — Encounter (HOSPITAL_COMMUNITY): Payer: Self-pay

## 2023-01-07 ENCOUNTER — Other Ambulatory Visit: Payer: Self-pay

## 2023-01-07 ENCOUNTER — Emergency Department (HOSPITAL_COMMUNITY)
Admission: EM | Admit: 2023-01-07 | Discharge: 2023-01-07 | Disposition: A | Discharge status: Home / Self Care | Payer: 59 | Attending: Emergency Medicine | Admitting: Emergency Medicine

## 2023-01-07 DIAGNOSIS — W19XXXA Unspecified fall, initial encounter: Secondary | ICD-10-CM

## 2023-01-07 DIAGNOSIS — M545 Low back pain, unspecified: Secondary | ICD-10-CM | POA: Diagnosis present

## 2023-01-07 DIAGNOSIS — W0110XA Fall on same level from slipping, tripping and stumbling with subsequent striking against unspecified object, initial encounter: Secondary | ICD-10-CM | POA: Insufficient documentation

## 2023-01-07 DIAGNOSIS — R519 Headache, unspecified: Secondary | ICD-10-CM | POA: Insufficient documentation

## 2023-01-07 DIAGNOSIS — M79605 Pain in left leg: Secondary | ICD-10-CM

## 2023-01-07 MED ORDER — MORPHINE SULFATE 15 MG PO TABS
15.0000 mg | ORAL_TABLET | Freq: Once | ORAL | Status: AC
  Administered 2023-01-07: 15 mg via ORAL
  Filled 2023-01-07: qty 1

## 2023-01-07 NOTE — ED Provider Notes (Signed)
[place] EMERGENCY DEPARTMENT AT [facility] Provider Note   CSN: [id number] Arrival date & time: 01/07/23  1513     History  Chief Complaint  Patient presents with   Fall   Back Pain    Julie Neal is a 67 y.o. female with past medical history significant for cervical spondylosis lumbosacral spondylosis without myelopathy or radiculopathy, migraines, fibromyalgia, osteoporosis, chronic pain syndrome presents to the ED complaining of lower back pain that radiates down both legs.  Patient has had 2 mechanical falls, one on 12/19/2022 and the other on 12/26/2022.  Patient states she hit her head the first time, but did not lose consciousness.  She states her head hurts some, but her lower back is most painful.  Patient has been taking her pain medicine as prescribed.  Denies loss of bladder or bowel control, urinary retention, numbness or weakness in the legs, paresthesia, inability to ambulate.         Home Medications Prior to Admission medications   Medication Sig Start Date End Date Taking? Authorizing Provider  ACCU-CHEK GUIDE test strip USE   TO CHECK GLUCOSE ONCE DAILY BEFORE BREAKFAST Patient not taking: Reported on 10/06/2022 04/08/22   Nche, Bonna Gains, NP  atorvastatin (LIPITOR) 40 MG tablet Take 1 tablet (40 mg total) by mouth daily. 07/08/22   Nche, Bonna Gains, NP  blood glucose meter kit and supplies Dispense based on patient and insurance preference. Once a day, before breakfast (FOR ICD-10 E11.65). Patient not taking: Reported on 10/06/2022 01/29/22   Anne Ng, NP  buPROPion (WELLBUTRIN XL) 300 MG 24 hr tablet Take 1 tablet by mouth once daily 07/23/22   Nche, Bonna Gains, NP  DULoxetine (CYMBALTA) 60 MG capsule Take 1 capsule (60 mg total) by mouth daily. 07/08/22   Nche, Bonna Gains, NP  morphine (MS CONTIN) 15 MG 12 hr tablet Take 15 mg by mouth every 8 (eight) hours. 09/24/22   [provider]  mupirocin ointment (BACTROBAN) 2  % Apply 1 Application topically 2 (two) times daily. Patient not taking: Reported on 10/06/2022 07/15/22   Anne Ng, NP  naloxone [facility]) nasal spray 4 mg/0.1 mL SMARTSIG:Spray(s) In Nostril 02/09/21   [provider]  oxyCODONE-acetaminophen (PERCOCET) 10-325 MG tablet Take 1 tablet by mouth every 6 (six) hours as needed. Patient not taking: Reported on 10/06/2022 02/09/21   [provider]  promethazine (PHENERGAN) 25 MG tablet Take 1 tablet (25 mg total) by mouth every 8 (eight) hours as needed for nausea or vomiting. Patient not taking: Reported on 10/06/2022 11/22/21   Nche, Bonna Gains, NP  sulfamethoxazole-trimethoprim (BACTRIM DS) 800-160 MG tablet Take 1 tablet by mouth 2 (two) times daily. Patient not taking: Reported on 10/06/2022 07/09/22   Nche, Bonna Gains, NP  SUMAtriptan (IMITREX) 50 MG tablet Take 1 tablet (50 mg total) by mouth once as needed for up to 1 dose for migraine (may repeat x 1 tab in 1-2hrs if headache persists). May repeat in 2 hours if headache persists or recurs. 04/02/21   Nche, Bonna Gains, NP  tizanidine (ZANAFLEX) 2 MG capsule Take 2 mg by mouth in the morning, at noon, in the evening, and at bedtime. 02/17/17   [provider]  traZODone (DESYREL) 100 MG tablet Take 1 tablet (100 mg total) by mouth at bedtime. 07/08/22   Nche, Bonna Gains, NP      Allergies    Mobic [meloxicam], Penicillins, and Nubain [nalbuphine hcl]    Review  of Systems   Review of Systems  Musculoskeletal:  Positive for back pain.    Physical Exam Updated Vital Signs BP (!) 120/90 (BP Location: Left Arm)   Pulse 82   Temp 98.2 F (36.8 C) (Oral)   Resp 18   Wt 63 kg   SpO2 98%   BMI 22.42 kg/m  Physical Exam Vitals and nursing note reviewed.  Constitutional:      General: She is not in acute distress.    Appearance: Normal appearance. She is not ill-appearing or diaphoretic.  HENT:     Head: Normocephalic and atraumatic.   Cardiovascular:     Rate and Rhythm: Normal rate and regular rhythm.  Pulmonary:     Effort: Pulmonary effort is normal.  Musculoskeletal:     Cervical back: Normal.     Thoracic back: Normal.     Lumbar back: Tenderness and bony tenderness present. No deformity, signs of trauma or spasms. Decreased range of motion. Negative right straight leg raise test and negative left straight leg raise test.     Comments: Patient has normal movement of both legs with intact sensation.  There is tenderness to palpation of lumbar spine, bilateral paraspinal muscles extending into buttocks.    Skin:    General: Skin is warm and dry.     Capillary Refill: Capillary refill takes less than 2 seconds.  Neurological:     Mental Status: She is alert. Mental status is at baseline.  Psychiatric:        Mood and Affect: Mood normal.        Behavior: Behavior normal.     ED Results / Procedures / Treatments   Labs (all labs ordered are listed, but only abnormal results are displayed) Labs Reviewed - No data to display  EKG None  Radiology CT Lumbar Spine Wo Contrast  Result Date: 01/07/2023 CLINICAL DATA:  Lumbar radiculopathy, trauma EXAM: CT LUMBAR SPINE WITHOUT CONTRAST TECHNIQUE: Multidetector CT imaging of the lumbar spine was performed without intravenous contrast administration. Multiplanar CT image reconstructions were also generated. RADIATION DOSE REDUCTION: This exam was performed according to the departmental dose-optimization program which includes automated exposure control, adjustment of the mA and/or kV according to patient size and/or use of iterative reconstruction technique. COMPARISON:  None Available. FINDINGS: Segmentation: 5 lumbar type vertebrae. Alignment: Normal. Vertebrae: No acute fracture or focal pathologic process. Paraspinal and other soft tissues: Negative. Disc levels: No evidence of high-grade spinal canal or neural foraminal stenosis. IMPRESSION: 1. No acute fracture or  traumatic malalignment of the lumbar spine. 2. No evidence of high-grade spinal canal or neural foraminal stenosis. If there is clinical concern for radiculopathy, consider further evaluation with MRI. Electronically Signed   By: Lorenza Cambridge M.D.   On: 01/07/2023 16:29    Procedures Procedures    Medications Ordered in ED Medications - No data to display  ED Course/ Medical Decision Making/ A&P                             Medical Decision Making Amount and/or Complexity of Data Reviewed Radiology: ordered.   This patient presents to the ED with chief complaint(s) of lower back pain and head pain following 2 falls with pertinent past medical history of osteoporosis, cervical and lumbar spondylosis, chronic pain syndrome (under pain management).  The complaint involves an extensive differential diagnosis and also carries with it a high risk of complications and morbidity.  The differential diagnosis includes acute fracture or subluxation of lumbar or cervical spine, skull fracture, acute intracranial injury, musculoskeletal strain, muscle spasm, contusion   The initial plan is to obtain CT imaging of head, neck and lumbar spine  Additional history obtained: Records reviewed  - patient has known cervical and lumbar spondylosis and is currently under pain management.  PDMP reviewed, patient receives morphine and diazepam monthly.  Initial Assessment:   Exam significant for tenderness to palpation of midline lumbar spine, lumbar paraspinal muscles, extending to bilateral buttocks.  Head is normocephalic atraumatic.  No masses or hematomas.  Cervical spine unremarkable, patient has full passive range of motion without pain.  Patient has normal sensation in bilateral lower extremities.  Independent visualization and interpretation of imaging: I independently visualized the following imaging with scope of interpretation limited to determining acute life threatening conditions related to  emergency care: CT head, c-spine, lumbar spine, which revealed no evidence of acute fracture or subluxation in cervical or lumbar spine.  No acute skull fracture or intracranial injury.  There is concern for fragmentation of patient's spinal cord stimulator.  Asked patient about implanted spinal cord stimulator, and she reports she no longer has active stimulator.   Treatment and Reassessment: Patient given dose of morphine IR tablet with improvement in symptoms.    Disposition:   Advised patient to take her pain medicine as prescribed for lower back pain.  Discussed supportive care measures for low back pain and preventing injury at home.  Recommended patient follow-up with pain management and/or spine specialist as needed.  The patient has been appropriately medically screened and/or stabilized in the ED. I have low suspicion for any other emergent medical condition which would require further screening, evaluation or treatment in the ED or require inpatient management. At time of discharge the patient is hemodynamically stable and in no acute distress. I have discussed work-up results and diagnosis with patient and answered all questions. Patient is agreeable with discharge plan. We discussed strict return precautions for returning to the emergency department and they verbalized understanding.           Final Clinical Impression(s) / ED Diagnoses Final diagnoses:  None    Rx / DC Orders ED Discharge Orders     None         Lenard Simmer, PA-C 01/07/23 1834    Charlynne Pander, MD 01/07/23 [phone]

## 2023-01-07 NOTE — Discharge Instructions (Addendum)
Thank you for allowing me to be a part of your care today.   Your CT scans did not show any broken bones in your spine.    Please follow up with your pain management provider as scheduled.  I recommend following up with spine specialist if you continue to have increased pain.   Return to the ED if you develop sudden worsening of your symptoms (leg weakness, numbness, loss of bowel or bladder control, inability to walk) or if you have any new concerns.

## 2023-01-07 NOTE — ED Triage Notes (Signed)
C/o mechanical fall x2 on 5/10 and 5/17 and hit head both times.  Denies blood thinner usage Denies LOC C/o lower back pain radiating down bilateral legs

## 2023-01-16 ENCOUNTER — Ambulatory Visit: Payer: 59

## 2023-01-20 ENCOUNTER — Telehealth: Payer: Self-pay | Admitting: Nurse Practitioner

## 2023-01-20 DIAGNOSIS — F3341 Major depressive disorder, recurrent, in partial remission: Secondary | ICD-10-CM

## 2023-01-20 MED ORDER — TRAZODONE HCL 100 MG PO TABS
100.0000 mg | ORAL_TABLET | Freq: Every day | ORAL | 0 refills | Status: AC
Start: 2023-01-20 — End: ?

## 2023-01-20 NOTE — Telephone Encounter (Signed)
Prescription Request  01/20/2023  LOV: 07/08/2022  What is the name of the medication or equipment? traZODone (DESYREL) 100 MG tablet [[id number]] and DULoxetine (CYMBALTA) 60 MG capsule [[id number]]   Have you contacted your pharmacy to request a refill? No   Which pharmacy would you like this sent to?   [facility] - [place] - [address] [place] [address] Phone: [phone] Fax: [phone]    Patient notified that their request is being sent to the clinical staff for review and that they should receive a response within 2 business days.   Please advise at Mobile [phone] (mobile)

## 2023-01-20 NOTE — Telephone Encounter (Signed)
Left message on voice mail to schedule an appointment.

## 2023-01-22 ENCOUNTER — Ambulatory Visit: Payer: 59

## 2023-03-05 ENCOUNTER — Ambulatory Visit
Admission: RE | Admit: 2023-03-05 | Discharge: 2023-03-05 | Disposition: A | Discharge status: Home / Self Care | Payer: 59 | Source: Ambulatory Visit | Attending: Nurse Practitioner | Admitting: Nurse Practitioner

## 2023-03-05 DIAGNOSIS — Z1231 Encounter for screening mammogram for malignant neoplasm of breast: Secondary | ICD-10-CM

## 2023-03-16 ENCOUNTER — Emergency Department (HOSPITAL_COMMUNITY)
Admission: EM | Admit: 2023-03-16 | Discharge: 2023-03-16 | Disposition: A | Discharge status: Home / Self Care | Payer: 59 | Attending: Emergency Medicine | Admitting: Emergency Medicine

## 2023-03-16 ENCOUNTER — Other Ambulatory Visit: Payer: Self-pay

## 2023-03-16 ENCOUNTER — Encounter (HOSPITAL_COMMUNITY): Payer: Self-pay | Admitting: Emergency Medicine

## 2023-03-16 DIAGNOSIS — G8929 Other chronic pain: Secondary | ICD-10-CM

## 2023-03-16 DIAGNOSIS — M545 Low back pain, unspecified: Secondary | ICD-10-CM | POA: Diagnosis present

## 2023-03-16 DIAGNOSIS — M5442 Lumbago with sciatica, left side: Secondary | ICD-10-CM | POA: Insufficient documentation

## 2023-03-16 LAB — COMPREHENSIVE METABOLIC PANEL
ALT: 46 U/L — ABNORMAL HIGH (ref 0–44)
AST: 50 U/L — ABNORMAL HIGH (ref 15–41)
Albumin: 4.2 g/dL (ref 3.5–5.0)
Alkaline Phosphatase: 55 U/L (ref 38–126)
Anion gap: 10 (ref 5–15)
BUN: 32 mg/dL — ABNORMAL HIGH (ref 8–23)
CO2: 24 mmol/L (ref 22–32)
Calcium: 9.4 mg/dL (ref 8.9–10.3)
Chloride: 104 mmol/L (ref 98–111)
Creatinine, Ser: 0.93 mg/dL (ref 0.44–1.00)
GFR, Estimated: >60 mL/min (ref >60)
Glucose, Bld: 97 mg/dL (ref 70–99)
Potassium: 3.6 mmol/L (ref 3.5–5.1)
Sodium: 138 mmol/L (ref 135–145)
Total Bilirubin: 0.7 mg/dL (ref 0.3–1.2)
Total Protein: 8 g/dL (ref 6.5–8.1)

## 2023-03-16 LAB — CBC WITH DIFFERENTIAL/PLATELET
Abs Immature Granulocytes: 0 10*3/uL (ref 0.00–0.07)
Basophils Absolute: 0 10*3/uL (ref 0.0–0.1)
Basophils Relative: 0 %
Eosinophils Absolute: 0.2 10*3/uL (ref 0.0–0.5)
Eosinophils Relative: 5 %
HCT: 42.1 % (ref 36.0–46.0)
Hemoglobin: 13.8 g/dL (ref 12.0–15.0)
Immature Granulocytes: 0 %
Lymphocytes Relative: 31 %
Lymphs Abs: 1.2 10*3/uL (ref 0.7–4.0)
MCH: 29.8 pg (ref 26.0–34.0)
MCHC: 32.8 g/dL (ref 30.0–36.0)
MCV: 90.9 fL (ref 80.0–100.0)
Monocytes Absolute: 0.3 10*3/uL (ref 0.1–1.0)
Monocytes Relative: 9 %
Neutro Abs: 2.1 10*3/uL (ref 1.7–7.7)
Neutrophils Relative %: 55 %
Platelets: 192 10*3/uL (ref 150–400)
RBC: 4.63 MIL/uL (ref 3.87–5.11)
RDW: 12.7 % (ref 11.5–15.5)
WBC: 3.8 10*3/uL — ABNORMAL LOW (ref 4.0–10.5)
nRBC: 0 % (ref 0.0–0.2)

## 2023-03-16 MED ORDER — DIAZEPAM 5 MG/ML IJ SOLN
2.5000 mg | Freq: Once | INTRAMUSCULAR | Status: AC
  Administered 2023-03-16: 2.5 mg via INTRAVENOUS
  Filled 2023-03-16: qty 2

## 2023-03-16 MED ORDER — HYDROMORPHONE HCL 1 MG/ML IJ SOLN
1.0000 mg | Freq: Once | INTRAMUSCULAR | Status: AC
  Administered 2023-03-16: 1 mg via INTRAVENOUS
  Filled 2023-03-16: qty 1

## 2023-03-16 MED ORDER — CYCLOBENZAPRINE HCL 10 MG PO TABS: 10.0000 mg | ORAL_TABLET | Freq: Three times a day (TID) | ORAL | 0 refills | Status: DC | PRN

## 2023-03-16 MED ORDER — METHYLPREDNISOLONE SODIUM SUCC 125 MG IJ SOLR
125.0000 mg | Freq: Once | INTRAMUSCULAR | Status: AC
  Administered 2023-03-16: 125 mg via INTRAVENOUS
  Filled 2023-03-16: qty 2

## 2023-03-16 MED ORDER — METHYLPREDNISOLONE 4 MG PO TBPK: ORAL_TABLET | ORAL | 0 refills | Status: AC

## 2023-03-16 MED ORDER — CYCLOBENZAPRINE HCL 10 MG PO TABS: 10.0000 mg | ORAL_TABLET | Freq: Three times a day (TID) | ORAL | 0 refills | Status: AC | PRN

## 2023-03-16 NOTE — Discharge Instructions (Addendum)
Continue take your pain medicine  Take Flexeril as needed for muscle spasms  Take Medrol Dosepak as prescribed  See your doctor for follow-up  Return to ER if you have worse back pain or trouble walking or fever

## 2023-03-16 NOTE — ED Triage Notes (Addendum)
Pt arriving via EMS from home  with right side back pain radiating down her leg. Uses walked at baseline. A&O x4

## 2023-03-16 NOTE — ED Provider Notes (Signed)
[place] EMERGENCY DEPARTMENT AT [facility] Provider Note   CSN: [id number] Arrival date & time: 03/16/23  1910     History  Chief Complaint  Patient presents with   Back Pain    Julie Neal is a 67 y.o. female history of chronic back pain, here presenting with worsening back pain.  She states that since yesterday she has been having worsening lower back pain rating down the left leg.  Patient has chronic back pain and had a spinal stimulator that failed so was removed.  Patient also is on chronic Percocet and morphine at home.  Patient has a pain contract with her doctor.  Patient denies any trauma or injury.  Denies any fevers.  The history is provided by the patient.       Home Medications Prior to Admission medications   Medication Sig Start Date End Date Taking? Authorizing Provider  ACCU-CHEK GUIDE test strip USE   TO CHECK GLUCOSE ONCE DAILY BEFORE BREAKFAST Patient not taking: Reported on 10/06/2022 04/08/22   Nche, Bonna Gains, NP  atorvastatin (LIPITOR) 40 MG tablet Take 1 tablet (40 mg total) by mouth daily. 07/08/22   Nche, Bonna Gains, NP  blood glucose meter kit and supplies Dispense based on patient and insurance preference. Once a day, before breakfast (FOR ICD-10 E11.65). Patient not taking: Reported on 10/06/2022 01/29/22   Anne Ng, NP  buPROPion (WELLBUTRIN XL) 300 MG 24 hr tablet Take 1 tablet by mouth once daily 07/23/22   Nche, Bonna Gains, NP  DULoxetine (CYMBALTA) 60 MG capsule Take 1 capsule (60 mg total) by mouth daily. 07/08/22   Nche, Bonna Gains, NP  morphine (MS CONTIN) 15 MG 12 hr tablet Take 15 mg by mouth every 8 (eight) hours. 09/24/22   [provider]  mupirocin ointment (BACTROBAN) 2 % Apply 1 Application topically 2 (two) times daily. Patient not taking: Reported on 10/06/2022 07/15/22   Anne Ng, NP  naloxone [facility]) nasal spray 4 mg/0.1 mL SMARTSIG:Spray(s) In Nostril 02/09/21   [provider]  oxyCODONE-acetaminophen (PERCOCET) 10-325 MG tablet Take 1 tablet by mouth every 6 (six) hours as needed. Patient not taking: Reported on 10/06/2022 02/09/21   [provider]  promethazine (PHENERGAN) 25 MG tablet Take 1 tablet (25 mg total) by mouth every 8 (eight) hours as needed for nausea or vomiting. Patient not taking: Reported on 10/06/2022 11/22/21   Nche, Bonna Gains, NP  sulfamethoxazole-trimethoprim (BACTRIM DS) 800-160 MG tablet Take 1 tablet by mouth 2 (two) times daily. Patient not taking: Reported on 10/06/2022 07/09/22   Nche, Bonna Gains, NP  SUMAtriptan (IMITREX) 50 MG tablet Take 1 tablet (50 mg total) by mouth once as needed for up to 1 dose for migraine (may repeat x 1 tab in 1-2hrs if headache persists). May repeat in 2 hours if headache persists or recurs. 04/02/21   Nche, Bonna Gains, NP  tizanidine (ZANAFLEX) 2 MG capsule Take 2 mg by mouth in the morning, at noon, in the evening, and at bedtime. 02/17/17   [provider]  traZODone (DESYREL) 100 MG tablet Take 1 tablet (100 mg total) by mouth at bedtime. No additional refill without office visit 01/20/23   Nche, Bonna Gains, NP      Allergies    Mobic [meloxicam], Penicillins, and Nubain [nalbuphine hcl]    Review of Systems   Review of Systems  Musculoskeletal:  Positive for back pain.  All other systems reviewed and are negative.  Physical Exam Updated Vital Signs BP 119/85   Pulse 67   Temp 98.6 F (37 C)   Resp 17   Ht 5\' 6"  (1.676 m)   Wt 63.5 kg   SpO2 99%   BMI 22.60 kg/m  Physical Exam Vitals and nursing note reviewed.  Constitutional:      Comments: Uncomfortable  HENT:     Head: Normocephalic.     Nose: Nose normal.     Mouth/Throat:     Mouth: Mucous membranes are moist.  Eyes:     Extraocular Movements: Extraocular movements intact.     Pupils: Pupils are equal, round, and reactive to light.  Cardiovascular:     Rate and Rhythm: Normal rate and  regular rhythm.     Pulses: Normal pulses.     Heart sounds: Normal heart sounds.  Pulmonary:     Effort: Pulmonary effort is normal.     Breath sounds: Normal breath sounds.  Abdominal:     General: Abdomen is flat.     Palpations: Abdomen is soft.  Musculoskeletal:     Cervical back: Normal range of motion and neck supple.     Comments: Mild left lower lumbar tenderness.  Patient has a midline scar that has healed  Skin:    Capillary Refill: Capillary refill takes less than 2 seconds.  Neurological:     General: No focal deficit present.     Mental Status: She is oriented to person, place, and time.     Comments: Patient has positive straight leg raise on the left.  Patient has no obvious saddle anesthesia.  Patient has normal reflexes bilateral knees.  Psychiatric:        Mood and Affect: Mood normal.        Behavior: Behavior normal.     ED Results / Procedures / Treatments   Labs (all labs ordered are listed, but only abnormal results are displayed) Labs Reviewed  CBC WITH DIFFERENTIAL/PLATELET  COMPREHENSIVE METABOLIC PANEL    EKG None  Radiology No results found.  Procedures Procedures    Medications Ordered in ED Medications  HYDROmorphone (DILAUDID) injection 1 mg (has no administration in time range)  diazepam (VALIUM) injection 2.5 mg (has no administration in time range)  methylPREDNISolone sodium succinate (SOLU-MEDROL) 125 mg/2 mL injection 125 mg (has no administration in time range)    ED Course/ Medical Decision Making/ A&P                                 Medical Decision Making Julie Neal is a 67 y.o. female here presenting with back pain.  Patient has chronic back pain.  Patient has worsening sciatica symptoms.  Patient is already on pain medicine.  Patient has no trauma or neurodeficits so we will hold off on MRI right now.  Will get labs and give pain medicine muscle relaxants and reassess.  10:37 PM I reviewed patient's labs and they  were unremarkable.  Patient's pain is under control.  She has pain doctor.  I told her to follow-up with her pain management doctor.  She states that she is allowed to get prescription for muscle relaxant so I will try Flexeril.  Problems Addressed: Chronic bilateral low back pain with left-sided sciatica: chronic illness or injury  Amount and/or Complexity of Data Reviewed Labs: ordered. Decision-making details documented in ED Course.  Risk Prescription drug management.    Final Clinical Impression(s) /  ED Diagnoses Final diagnoses:  None    Rx / DC Orders ED Discharge Orders     None         Charlynne Pander, MD 03/16/23 2238

## 2023-04-03 ENCOUNTER — Emergency Department (HOSPITAL_COMMUNITY)
Admission: EM | Admit: 2023-04-03 | Discharge: 2023-04-04 | Disposition: A | Discharge status: Home / Self Care | Payer: 59 | Attending: Emergency Medicine | Admitting: Emergency Medicine

## 2023-04-03 ENCOUNTER — Encounter (HOSPITAL_COMMUNITY): Payer: Self-pay

## 2023-04-03 ENCOUNTER — Other Ambulatory Visit: Payer: Self-pay

## 2023-04-03 ENCOUNTER — Emergency Department (HOSPITAL_COMMUNITY): Payer: 59

## 2023-04-03 DIAGNOSIS — I951 Orthostatic hypotension: Secondary | ICD-10-CM | POA: Diagnosis not present

## 2023-04-03 DIAGNOSIS — S0101XA Laceration without foreign body of scalp, initial encounter: Secondary | ICD-10-CM | POA: Diagnosis not present

## 2023-04-03 DIAGNOSIS — R944 Abnormal results of kidney function studies: Secondary | ICD-10-CM | POA: Diagnosis not present

## 2023-04-03 DIAGNOSIS — S0990XA Unspecified injury of head, initial encounter: Secondary | ICD-10-CM | POA: Diagnosis present

## 2023-04-03 DIAGNOSIS — Z7982 Long term (current) use of aspirin: Secondary | ICD-10-CM | POA: Insufficient documentation

## 2023-04-03 DIAGNOSIS — W108XXA Fall (on) (from) other stairs and steps, initial encounter: Secondary | ICD-10-CM | POA: Insufficient documentation

## 2023-04-03 DIAGNOSIS — E119 Type 2 diabetes mellitus without complications: Secondary | ICD-10-CM | POA: Diagnosis not present

## 2023-04-03 LAB — COMPREHENSIVE METABOLIC PANEL
ALT: 56 U/L — ABNORMAL HIGH (ref 0–44)
AST: 35 U/L (ref 15–41)
Albumin: 4.3 g/dL (ref 3.5–5.0)
Alkaline Phosphatase: 63 U/L (ref 38–126)
Anion gap: 9 (ref 5–15)
BUN: 27 mg/dL — ABNORMAL HIGH (ref 8–23)
CO2: 24 mmol/L (ref 22–32)
Calcium: 9.7 mg/dL (ref 8.9–10.3)
Chloride: 102 mmol/L (ref 98–111)
Creatinine, Ser: 1.23 mg/dL — ABNORMAL HIGH (ref 0.44–1.00)
GFR, Estimated: 48 mL/min — ABNORMAL LOW (ref >60)
Glucose, Bld: 114 mg/dL — ABNORMAL HIGH (ref 70–99)
Potassium: 3.7 mmol/L (ref 3.5–5.1)
Sodium: 135 mmol/L (ref 135–145)
Total Bilirubin: 0.6 mg/dL (ref 0.3–1.2)
Total Protein: 7.8 g/dL (ref 6.5–8.1)

## 2023-04-03 LAB — CBC
HCT: 44.4 % (ref 36.0–46.0)
Hemoglobin: 14.8 g/dL (ref 12.0–15.0)
MCH: 30.8 pg (ref 26.0–34.0)
MCHC: 33.3 g/dL (ref 30.0–36.0)
MCV: 92.3 fL (ref 80.0–100.0)
Platelets: 265 10*3/uL (ref 150–400)
RBC: 4.81 MIL/uL (ref 3.87–5.11)
RDW: 13.4 % (ref 11.5–15.5)
WBC: 8.4 10*3/uL (ref 4.0–10.5)
nRBC: 0 % (ref 0.0–0.2)

## 2023-04-03 LAB — CBG MONITORING, ED: Glucose-Capillary: 98 mg/dL (ref 70–99)

## 2023-04-03 MED ORDER — LIDOCAINE-EPINEPHRINE (PF) 2 %-1:200000 IJ SOLN
10.0000 mL | Freq: Once | INTRAMUSCULAR | Status: DC
  Filled 2023-04-03: qty 20

## 2023-04-03 MED ORDER — LACTATED RINGERS IV BOLUS
1000.0000 mL | Freq: Once | INTRAVENOUS | Status: AC
  Administered 2023-04-03: 1000 mL via INTRAVENOUS

## 2023-04-03 MED ORDER — DIPHENHYDRAMINE HCL 50 MG/ML IJ SOLN: 50.0000 mg | Freq: Once | INTRAMUSCULAR | Status: DC

## 2023-04-03 MED ORDER — LACTATED RINGERS IV BOLUS: 1000.0000 mL | Freq: Once | INTRAVENOUS | Status: DC

## 2023-04-03 MED ORDER — METHYLPREDNISOLONE SODIUM SUCC 40 MG IJ SOLR: 40.0000 mg | Freq: Every day | INTRAMUSCULAR | Status: DC

## 2023-04-03 NOTE — ED Triage Notes (Signed)
Pt was walking down the stairs when she had an LOC episode and fell. Pt did hit her head, denies any blood thinners. Negative for s/s of stroke.

## 2023-04-03 NOTE — ED Provider Notes (Signed)
[place] EMERGENCY DEPARTMENT AT [facility] Provider Note   CSN: [id number] Arrival date & time: 04/03/23  1053     History  Chief Complaint  Patient presents with   Loss of Consciousness   Fall         Julie Neal is a 67 y.o. female with history of gait abnormality due to leg length inequality, diabetes, fibromyalgia, presents with concern for fall at approximately 9 AM this morning.  She states she got out of bed and started going down the stairs when she felt like she was going to pass out.  She stopped and started to feel better, but when she started to continue down the stairs she lost consciousness and hit her head on the banister.  The fall was unwitnessed.  She states this sort of dizziness has happened before.  She denies any palpitations, racing, changes in vision before the fall.  Currently denies any changes in vision, nausea or vomiting, significant head pain besides her laceration.  She currently feels back to baseline.  Denies any previous cardiac issues. She is not on any blood thinners. Patient states she is up to date on her tetanus.  Reports she is eating and drinking normally.    Loss of Consciousness Fall       Home Medications Prior to Admission medications   Medication Sig Start Date End Date Taking? Authorizing Provider  ACCU-CHEK GUIDE test strip USE   TO CHECK GLUCOSE ONCE DAILY BEFORE BREAKFAST 04/08/22   Nche, Bonna Gains, NP  aspirin EC 81 MG tablet Take 81 mg by mouth daily. Swallow whole.    [provider]  atorvastatin (LIPITOR) 40 MG tablet Take 1 tablet (40 mg total) by mouth daily. 07/08/22   Nche, Bonna Gains, NP  blood glucose meter kit and supplies Dispense based on patient and insurance preference. Once a day, before breakfast (FOR ICD-10 E11.65). 01/29/22   Nche, Bonna Gains, NP  buPROPion (WELLBUTRIN XL) 300 MG 24 hr tablet Take 1 tablet by mouth once daily Patient not taking: Reported on 03/16/2023  07/23/22   Nche, Bonna Gains, NP  cyclobenzaprine (FLEXERIL) 10 MG tablet Take 1 tablet (10 mg total) by mouth 3 (three) times daily as needed for muscle spasms. 03/16/23   Charlynne Pander, MD  diazepam (VALIUM) 5 MG tablet Take 5 mg by mouth in the morning.    [provider]  DULoxetine (CYMBALTA) 30 MG capsule Take 30 mg by mouth See admin instructions. Take 30 mg by mouth in the morning and evening    [provider]  DULoxetine (CYMBALTA) 60 MG capsule Take 1 capsule (60 mg total) by mouth daily. Patient taking differently: Take 60 mg by mouth See admin instructions. Take 60 mg by mouth in the morning and evening 07/08/22   Nche, Bonna Gains, NP  methylPREDNISolone (MEDROL DOSEPAK) 4 MG TBPK tablet Use as directed 03/16/23   Charlynne Pander, MD  morphine (MS CONTIN) 15 MG 12 hr tablet Take 15 mg by mouth every 12 (twelve) hours. 09/24/22   [provider]  mupirocin ointment (BACTROBAN) 2 % Apply 1 Application topically 2 (two) times daily. Patient taking differently: Apply 1 Application topically See admin instructions. Apply to MRSA breakouts three times a day 07/15/22   Nche, Bonna Gains, NP  naloxone [facility]) nasal spray 4 mg/0.1 mL Place 1 spray into the nose once as needed (for opioid crisis). 02/09/21   [provider]  oxyCODONE-acetaminophen (PERCOCET/ROXICET) 5-325 MG  tablet Take 1 tablet by mouth daily at 12 noon.    [provider]  promethazine (PHENERGAN) 25 MG tablet Take 1 tablet (25 mg total) by mouth every 8 (eight) hours as needed for nausea or vomiting. Patient not taking: Reported on 03/16/2023 11/22/21   Nche, Bonna Gains, NP  sulfamethoxazole-trimethoprim (BACTRIM DS) 800-160 MG tablet Take 1 tablet by mouth 2 (two) times daily. 07/09/22   Nche, Bonna Gains, NP  SUMAtriptan (IMITREX) 50 MG tablet Take 1 tablet (50 mg total) by mouth once as needed for up to 1 dose for migraine (may repeat x 1 tab in 1-2hrs if headache  persists). May repeat in 2 hours if headache persists or recurs. Patient not taking: Reported on 03/16/2023 04/02/21   Nche, Bonna Gains, NP  tiZANidine (ZANAFLEX) 2 MG tablet Take 2 mg by mouth 3 (three) times daily.    [provider]  topiramate (TOPAMAX) 50 MG tablet Take 50 mg by mouth See admin instructions. Take 50 mg by mouth at 12 NOON and at 9 PM    [provider]  traZODone (DESYREL) 100 MG tablet Take 1 tablet (100 mg total) by mouth at bedtime. No additional refill without office visit Patient taking differently: Take 100 mg by mouth at bedtime. 01/20/23   Nche, Bonna Gains, NP      Allergies    Mobic [meloxicam], Penicillins, Nubain [nalbuphine hcl], and Sumatriptan    Review of Systems   Review of Systems  Cardiovascular:  Positive for syncope.    Physical Exam Updated Vital Signs BP (!) 145/64   Pulse 86   Temp 98 F (36.7 C)   Resp 18   Ht 5\' 6"  (1.676 m)   Wt 63.5 kg   SpO2 100%   BMI 22.60 kg/m  Physical Exam Vitals and nursing note reviewed.  Constitutional:      General: She is not in acute distress.    Appearance: She is well-developed.  HENT:     Head: Normocephalic.      Comments: 3cm deep laceration to the left occipital skull, bleeding controlled currently    Mouth/Throat:     Mouth: Mucous membranes are dry.  Eyes:     Extraocular Movements: Extraocular movements intact.     Conjunctiva/sclera: Conjunctivae normal.     Pupils: Pupils are equal, round, and reactive to light.  Cardiovascular:     Rate and Rhythm: Normal rate and regular rhythm.     Heart sounds: No murmur heard. Pulmonary:     Effort: Pulmonary effort is normal. No respiratory distress.     Breath sounds: Normal breath sounds.  Abdominal:     Palpations: Abdomen is soft.     Tenderness: There is no abdominal tenderness.  Musculoskeletal:        General: No swelling or deformity. Normal range of motion.     Cervical back: Normal range of motion and neck  supple. No rigidity.     Comments: No tenderness to palpation of the spinous processes   Skin:    General: Skin is warm and dry.     Capillary Refill: Capillary refill takes less than 2 seconds.  Neurological:     Mental Status: She is alert.     Comments: Cranial nerves III through XII intact 5/5 strength of the upper and lower extremities bilaterally Intact sensation in the upper and lower extremities bilaterally Able to ambulate without difficulty  Psychiatric:        Mood and Affect: Mood  normal.     ED Results / Procedures / Treatments   Labs (all labs ordered are listed, but only abnormal results are displayed) Labs Reviewed  COMPREHENSIVE METABOLIC PANEL - Abnormal; Notable for the following components:      Result Value   Glucose, Bld 114 (*)    BUN 27 (*)    Creatinine, Ser 1.23 (*)    ALT 56 (*)    GFR, Estimated 48 (*)    All other components within normal limits  CBC  URINALYSIS, ROUTINE W REFLEX MICROSCOPIC  CBG MONITORING, ED  CBG MONITORING, ED    EKG None  Radiology CT Head Wo Contrast  Result Date: 04/03/2023 CLINICAL DATA:  Larey Seat down stairs and hit head EXAM: CT HEAD WITHOUT CONTRAST TECHNIQUE: Contiguous axial images were obtained from the base of the skull through the vertex without intravenous contrast. RADIATION DOSE REDUCTION: This exam was performed according to the departmental dose-optimization program which includes automated exposure control, adjustment of the mA and/or kV according to patient size and/or use of iterative reconstruction technique. COMPARISON:  CT brain 01/07/2023 FINDINGS: Brain: No acute territorial infarction, hemorrhage, or intracranial mass. Spinal stimulator lead with gap at the C1-C2 level on scout image as noted previously. Patchy white matter hypodensity. Stable ventricle size Vascular: No hyperdense vessels.  Carotid vascular calcification Skull: Normal. Negative for fracture or focal lesion. Sinuses/Orbits: No acute  finding. Other: None IMPRESSION: 1. No CT evidence for acute intracranial abnormality. 2. Chronic small vessel ischemic changes of the white matter. Electronically Signed   By: Jasmine Pang M.D.   On: 04/03/2023 15:19    Procedures .Marland KitchenLaceration Repair  Date/Time: 04/03/2023 5:30 PM  Performed by: Arabella Merles, PA-C Authorized by: Arabella Merles, PA-C   Consent:    Consent obtained:  Verbal   Consent given by:  Patient   Risks discussed:  Infection, pain and poor cosmetic result   Alternatives discussed:  No treatment Universal protocol:    Procedure explained and questions answered to patient or proxy's satisfaction: yes     Test results available: yes     Imaging studies available: yes     Patient identity confirmed:  Verbally with patient Anesthesia:    Anesthesia method:  Local infiltration   Local anesthetic:  Lidocaine 1% WITH epi Laceration details:    Location:  Scalp   Scalp location:  Occipital   Length (cm):  3   Depth (mm):  8 Exploration:    Imaging outcome: foreign body not noted   Treatment:    Area cleansed with:  Povidone-iodine   Irrigation solution:  Sterile saline   Irrigation volume:  1L   Irrigation method:  Syringe Skin repair:    Repair method:  Staples   Number of staples:  6 Repair type:    Repair type:  Simple Post-procedure details:    Dressing:  Open (no dressing)   Procedure completion:  Tolerated well, no immediate complications     Medications Ordered in ED Medications  lidocaine-EPINEPHrine (XYLOCAINE W/EPI) 2 %-1:200000 (PF) injection 10 mL (has no administration in time range)  lactated ringers bolus 1,000 mL (1,000 mLs Intravenous New Bag/Given 04/03/23 1407)    ED Course/ Medical Decision Making/ A&P                                 Medical Decision Making Amount and/or Complexity of Data Reviewed Labs: ordered. Radiology: ordered.  Risk Prescription  drug management.   67 y.o. female with pertinent past medical  history of gait abnormality due to leg length inequality, diabetes, fibromyalgia presents to the ED for concern of fall and loss of consciousness at approximately 9am today  Differential diagnosis includes but is not limited to orthostatic hypotension, arrhythmia, mechanical fall, dehydration, electrolyte abnormality, hypoglycemia  ED Course:  Patient presents due to a syncopal event at approximately 9 AM this morning.  She has just gotten up from her bed and started walking on the stairs when she started to feel like she was going to pass out. She did end up having a syncopal event and and hit her head on the banister.  She sustained a laceration to the left occipital region of the skull.  She currently feels back to baseline.  She is not on any blood thinners.  She has a slightly elevated Cr at 1.23, suspect this is up due to dehydration. She has dry mucous membranes on exam and has a slightly low blood pressure of 95/88, will give 1 L lactated Ringer bolus.  CBG without hypoglycemia, blood glucose is 98.  On neurological examination, cranial nerves III through XII intact, sensation and strength intact, gait appropriate.  Able to rotate neck without pain, no spinal tenderness to palpation.  CT head with no signs of bleed or intracranial abnormality. EKG showed sinus tachycardia upon arrival, but upon reevaluation she is currently in normal sinus rhythm upon initial evaluation. Vital signs demonstrate a change in BP from 118/74 and pulse of 58 laying to 109/59 and HR 111 at 3 minutes standing, suspect orthostatic hypotension as a cause of her fall earlier.  Upon reevaluation after 1 L LR, patient blood pressure back up to 145/64, all other vital signs within normal limits.   Patient's head laceration was irrigated with, and closed with 6 staples.  She tolerated this well and was discharged home. Dicussed this case with Dr. Estell Harpin who also evaluated patient and is in agreement with the plan.    Impression: Orthostatic hypotension Head laceration  Disposition:  The patient was discharged home with instructions to follow-up with her PCP in 7 to 10 days for staple removal.  She was instructed on proper wound care.  Continue to keep hydrated and use compression stockings to help with her orthostatic hypotension.  She is instructed on changing positions slowly. Return precautions given.  Lab Tests: I Ordered, and personally interpreted labs.  The pertinent results include:   CBC unremarkable CMP with slight increase in creatinine at 1.23, up from 0.93 2 weeks ago. CBG 98  Imaging Studies ordered: I ordered imaging studies including CT head  I independently visualized the imaging with scope of interpretation limited to determining acute life threatening conditions related to emergency care. Imaging showed no intracranial hemorrhage  I agree with the radiologist interpretation   Cardiac Monitoring: / EKG: The patient was maintained on a cardiac monitor.  I personally viewed and interpreted the cardiac monitored which showed an underlying rhythm of:  Tachycardia on initial EKG, she now is normal sinus rhythm in the room    Co morbidities that complicate the patient evaluation  gait abnormality due to leg length inequality, diabetes, fibromyalgia             Final Clinical Impression(s) / ED Diagnoses Final diagnoses:  Orthostatic hypotension  Laceration of scalp, initial encounter    Rx / DC Orders ED Discharge Orders     None  Arabella Merles, PA-C 04/03/23 1736    Bethann Berkshire, MD 04/05/23 [phone]

## 2023-04-03 NOTE — Discharge Instructions (Addendum)
You must get your staples removed in 7-10 days. We recommend visiting your PCP or an urgent care for suture removal. However, you may also return back to the ER if you are unable to be seen by your PCP or at urgent care.   You may gently clean the area around your laceration as needed with soap and water. Place antibiotic ointment such as bacitracin or neosporin over your laceration after cleaning the area.  Do not submerge your laceration in water (no baths, swimming) until it is fully healed. You may shower.    You have orthostatic hypotension, which likely caused your dizziness today and fall. Please keep well hydrated. Get up from a sitting to standing position very slowly (sit on the edge of the bed or chair for a couple minutes and then stand slowly to standing). You may also use compression stocking to help to get the blood in your legs back up to your heart.  You may use up to 800mg  ibuprofen every 6 hours as needed for pain.  Return to the ER should you develop fever, chills, pus drainage from your wound, redness around your wound, chest pain, shortness of breath, any other new or concerning symptoms.

## 2023-04-29 ENCOUNTER — Other Ambulatory Visit: Payer: Self-pay | Admitting: Orthopedic Surgery

## 2023-04-29 DIAGNOSIS — M545 Low back pain, unspecified: Secondary | ICD-10-CM

## 2023-05-04 NOTE — Discharge Instructions (Signed)
Myelogram Discharge Instructions  Go home and rest quietly as needed. You may resume normal activities; however, do not exert yourself strongly or do any heavy lifting today and tomorrow.   DO NOT drive today.    You may resume your normal diet and medications unless otherwise indicated. Drink lots of extra fluids today and tomorrow.   The incidence of headache, nausea, or vomiting is about 5% (one in 20 patients).  If you develop a headache, lie flat for 24 hours and drink plenty of fluids until the headache goes away.  Caffeinated beverages may be helpful. If when you get up you still have a headache when standing, go back to bed and force fluids for another 24 hours.   If you develop severe nausea and vomiting or a headache that does not go away with the flat bedrest after 48 hours, please call [phone].   Call your physician for a follow-up appointment.  The results of your myelogram will be sent directly to your physician by the following day.  If you have any questions or if complications develop after you arrive home, please call [phone].  Discharge instructions have been explained to the patient.  The patient, or the person responsible for the patient, fully understands these instructions.   Thank you for visiting our office today.

## 2023-05-05 ENCOUNTER — Ambulatory Visit
Admission: RE | Admit: 2023-05-05 | Discharge: 2023-05-05 | Disposition: A | Discharge status: Home / Self Care | Payer: 59 | Source: Ambulatory Visit | Attending: Orthopedic Surgery | Admitting: Orthopedic Surgery

## 2023-05-05 DIAGNOSIS — M545 Low back pain, unspecified: Secondary | ICD-10-CM

## 2023-05-05 MED ORDER — IOPAMIDOL (ISOVUE-M 200) INJECTION 41%
18.0000 mL | Freq: Once | INTRAMUSCULAR | Status: AC
  Administered 2023-05-05: 18 mL via INTRATHECAL

## 2023-05-05 MED ORDER — MEPERIDINE HCL 50 MG/ML IJ SOLN: 50.0000 mg | Freq: Once | INTRAMUSCULAR | Status: DC | PRN

## 2023-05-05 MED ORDER — DIAZEPAM 5 MG PO TABS: 5.0000 mg | ORAL_TABLET | Freq: Once | ORAL | Status: DC

## 2023-05-05 MED ORDER — ONDANSETRON HCL 4 MG/2ML IJ SOLN: 4.0000 mg | Freq: Once | INTRAMUSCULAR | Status: DC | PRN

## 2023-05-14 ENCOUNTER — Other Ambulatory Visit: Payer: Self-pay | Admitting: Orthopedic Surgery

## 2023-05-14 DIAGNOSIS — M545 Low back pain, unspecified: Secondary | ICD-10-CM

## 2023-05-26 ENCOUNTER — Ambulatory Visit
Admission: RE | Admit: 2023-05-26 | Discharge: 2023-05-26 | Disposition: A | Discharge status: Home / Self Care | Payer: 59 | Source: Ambulatory Visit | Attending: Orthopedic Surgery | Admitting: Orthopedic Surgery

## 2023-05-26 DIAGNOSIS — M545 Low back pain, unspecified: Secondary | ICD-10-CM

## 2023-05-26 MED ORDER — IOPAMIDOL (ISOVUE-M 200) INJECTION 41%
1.0000 mL | Freq: Once | INTRAMUSCULAR | Status: AC
  Administered 2023-05-26: 1 mL via EPIDURAL

## 2023-05-26 MED ORDER — METHYLPREDNISOLONE ACETATE 40 MG/ML INJ SUSP (RADIOLOG
80.0000 mg | Freq: Once | INTRAMUSCULAR | Status: AC
  Administered 2023-05-26: 80 mg via EPIDURAL

## 2023-05-26 NOTE — Discharge Instructions (Signed)

## 2023-09-14 DIAGNOSIS — M47812 Spondylosis without myelopathy or radiculopathy, cervical region: Secondary | ICD-10-CM | POA: Diagnosis not present

## 2023-09-14 DIAGNOSIS — M47817 Spondylosis without myelopathy or radiculopathy, lumbosacral region: Secondary | ICD-10-CM | POA: Diagnosis not present

## 2023-11-11 DIAGNOSIS — G894 Chronic pain syndrome: Secondary | ICD-10-CM | POA: Diagnosis not present

## 2023-11-11 DIAGNOSIS — M47812 Spondylosis without myelopathy or radiculopathy, cervical region: Secondary | ICD-10-CM | POA: Diagnosis not present

## 2023-11-11 DIAGNOSIS — M62830 Muscle spasm of back: Secondary | ICD-10-CM | POA: Diagnosis not present

## 2023-11-11 DIAGNOSIS — M47817 Spondylosis without myelopathy or radiculopathy, lumbosacral region: Secondary | ICD-10-CM | POA: Diagnosis not present

## 2023-11-14 DIAGNOSIS — M47817 Spondylosis without myelopathy or radiculopathy, lumbosacral region: Secondary | ICD-10-CM | POA: Diagnosis not present

## 2023-11-14 DIAGNOSIS — M47812 Spondylosis without myelopathy or radiculopathy, cervical region: Secondary | ICD-10-CM | POA: Diagnosis not present

## 2023-11-15 DIAGNOSIS — M47812 Spondylosis without myelopathy or radiculopathy, cervical region: Secondary | ICD-10-CM | POA: Diagnosis not present

## 2023-11-15 DIAGNOSIS — M47817 Spondylosis without myelopathy or radiculopathy, lumbosacral region: Secondary | ICD-10-CM | POA: Diagnosis not present

## 2023-12-10 DIAGNOSIS — M47812 Spondylosis without myelopathy or radiculopathy, cervical region: Secondary | ICD-10-CM | POA: Diagnosis not present

## 2023-12-10 DIAGNOSIS — G894 Chronic pain syndrome: Secondary | ICD-10-CM | POA: Diagnosis not present

## 2023-12-10 DIAGNOSIS — M47817 Spondylosis without myelopathy or radiculopathy, lumbosacral region: Secondary | ICD-10-CM | POA: Diagnosis not present

## 2023-12-10 DIAGNOSIS — M62830 Muscle spasm of back: Secondary | ICD-10-CM | POA: Diagnosis not present

## 2023-12-14 DIAGNOSIS — M47812 Spondylosis without myelopathy or radiculopathy, cervical region: Secondary | ICD-10-CM | POA: Diagnosis not present

## 2023-12-14 DIAGNOSIS — M47817 Spondylosis without myelopathy or radiculopathy, lumbosacral region: Secondary | ICD-10-CM | POA: Diagnosis not present

## 2023-12-15 DIAGNOSIS — M47812 Spondylosis without myelopathy or radiculopathy, cervical region: Secondary | ICD-10-CM | POA: Diagnosis not present

## 2023-12-15 DIAGNOSIS — M47817 Spondylosis without myelopathy or radiculopathy, lumbosacral region: Secondary | ICD-10-CM | POA: Diagnosis not present

## 2024-01-11 DIAGNOSIS — M47812 Spondylosis without myelopathy or radiculopathy, cervical region: Secondary | ICD-10-CM | POA: Diagnosis not present

## 2024-01-11 DIAGNOSIS — M62830 Muscle spasm of back: Secondary | ICD-10-CM | POA: Diagnosis not present

## 2024-01-11 DIAGNOSIS — M47817 Spondylosis without myelopathy or radiculopathy, lumbosacral region: Secondary | ICD-10-CM | POA: Diagnosis not present

## 2024-01-11 DIAGNOSIS — G894 Chronic pain syndrome: Secondary | ICD-10-CM | POA: Diagnosis not present

## 2024-01-14 DIAGNOSIS — M47812 Spondylosis without myelopathy or radiculopathy, cervical region: Secondary | ICD-10-CM | POA: Diagnosis not present

## 2024-01-14 DIAGNOSIS — M47817 Spondylosis without myelopathy or radiculopathy, lumbosacral region: Secondary | ICD-10-CM | POA: Diagnosis not present

## 2024-01-15 DIAGNOSIS — M47817 Spondylosis without myelopathy or radiculopathy, lumbosacral region: Secondary | ICD-10-CM | POA: Diagnosis not present

## 2024-01-15 DIAGNOSIS — M47812 Spondylosis without myelopathy or radiculopathy, cervical region: Secondary | ICD-10-CM | POA: Diagnosis not present

## 2024-01-29 ENCOUNTER — Other Ambulatory Visit: Payer: Self-pay | Admitting: Nurse Practitioner

## 2024-01-29 DIAGNOSIS — Z1231 Encounter for screening mammogram for malignant neoplasm of breast: Secondary | ICD-10-CM

## 2024-02-08 DIAGNOSIS — M62830 Muscle spasm of back: Secondary | ICD-10-CM | POA: Diagnosis not present

## 2024-02-08 DIAGNOSIS — M47812 Spondylosis without myelopathy or radiculopathy, cervical region: Secondary | ICD-10-CM | POA: Diagnosis not present

## 2024-02-08 DIAGNOSIS — M47817 Spondylosis without myelopathy or radiculopathy, lumbosacral region: Secondary | ICD-10-CM | POA: Diagnosis not present

## 2024-02-08 DIAGNOSIS — G894 Chronic pain syndrome: Secondary | ICD-10-CM | POA: Diagnosis not present

## 2024-02-13 DIAGNOSIS — M47817 Spondylosis without myelopathy or radiculopathy, lumbosacral region: Secondary | ICD-10-CM | POA: Diagnosis not present

## 2024-02-13 DIAGNOSIS — M47812 Spondylosis without myelopathy or radiculopathy, cervical region: Secondary | ICD-10-CM | POA: Diagnosis not present

## 2024-02-14 DIAGNOSIS — M47817 Spondylosis without myelopathy or radiculopathy, lumbosacral region: Secondary | ICD-10-CM | POA: Diagnosis not present

## 2024-02-14 DIAGNOSIS — M47812 Spondylosis without myelopathy or radiculopathy, cervical region: Secondary | ICD-10-CM | POA: Diagnosis not present

## 2024-02-26 DIAGNOSIS — E1151 Type 2 diabetes mellitus with diabetic peripheral angiopathy without gangrene: Secondary | ICD-10-CM | POA: Diagnosis not present

## 2024-02-26 DIAGNOSIS — Z0001 Encounter for general adult medical examination with abnormal findings: Secondary | ICD-10-CM | POA: Diagnosis not present

## 2024-02-26 DIAGNOSIS — M2170 Unequal limb length (acquired), unspecified site: Secondary | ICD-10-CM | POA: Diagnosis not present

## 2024-02-26 DIAGNOSIS — G4700 Insomnia, unspecified: Secondary | ICD-10-CM | POA: Diagnosis not present

## 2024-02-26 DIAGNOSIS — G43909 Migraine, unspecified, not intractable, without status migrainosus: Secondary | ICD-10-CM | POA: Diagnosis not present

## 2024-02-26 DIAGNOSIS — Z79899 Other long term (current) drug therapy: Secondary | ICD-10-CM | POA: Diagnosis not present

## 2024-02-26 DIAGNOSIS — F330 Major depressive disorder, recurrent, mild: Secondary | ICD-10-CM | POA: Diagnosis not present

## 2024-02-26 DIAGNOSIS — E559 Vitamin D deficiency, unspecified: Secondary | ICD-10-CM | POA: Diagnosis not present

## 2024-02-26 DIAGNOSIS — N1831 Chronic kidney disease, stage 3a: Secondary | ICD-10-CM | POA: Diagnosis not present

## 2024-02-26 DIAGNOSIS — Z136 Encounter for screening for cardiovascular disorders: Secondary | ICD-10-CM | POA: Diagnosis not present

## 2024-02-26 DIAGNOSIS — I129 Hypertensive chronic kidney disease with stage 1 through stage 4 chronic kidney disease, or unspecified chronic kidney disease: Secondary | ICD-10-CM | POA: Diagnosis not present

## 2024-02-26 DIAGNOSIS — Z139 Encounter for screening, unspecified: Secondary | ICD-10-CM | POA: Diagnosis not present

## 2024-03-07 ENCOUNTER — Ambulatory Visit

## 2024-03-07 DIAGNOSIS — M47812 Spondylosis without myelopathy or radiculopathy, cervical region: Secondary | ICD-10-CM | POA: Diagnosis not present

## 2024-03-07 DIAGNOSIS — M62830 Muscle spasm of back: Secondary | ICD-10-CM | POA: Diagnosis not present

## 2024-03-07 DIAGNOSIS — G894 Chronic pain syndrome: Secondary | ICD-10-CM | POA: Diagnosis not present

## 2024-03-07 DIAGNOSIS — M47817 Spondylosis without myelopathy or radiculopathy, lumbosacral region: Secondary | ICD-10-CM | POA: Diagnosis not present

## 2024-03-08 ENCOUNTER — Ambulatory Visit

## 2024-03-08 DIAGNOSIS — Z1231 Encounter for screening mammogram for malignant neoplasm of breast: Secondary | ICD-10-CM | POA: Diagnosis not present

## 2024-03-16 DIAGNOSIS — M47812 Spondylosis without myelopathy or radiculopathy, cervical region: Secondary | ICD-10-CM | POA: Diagnosis not present

## 2024-03-16 DIAGNOSIS — M47817 Spondylosis without myelopathy or radiculopathy, lumbosacral region: Secondary | ICD-10-CM | POA: Diagnosis not present

## 2024-04-04 DIAGNOSIS — M62830 Muscle spasm of back: Secondary | ICD-10-CM | POA: Diagnosis not present

## 2024-04-04 DIAGNOSIS — G894 Chronic pain syndrome: Secondary | ICD-10-CM | POA: Diagnosis not present

## 2024-04-04 DIAGNOSIS — M47812 Spondylosis without myelopathy or radiculopathy, cervical region: Secondary | ICD-10-CM | POA: Diagnosis not present

## 2024-04-04 DIAGNOSIS — M47817 Spondylosis without myelopathy or radiculopathy, lumbosacral region: Secondary | ICD-10-CM | POA: Diagnosis not present

## 2024-04-16 DIAGNOSIS — M47817 Spondylosis without myelopathy or radiculopathy, lumbosacral region: Secondary | ICD-10-CM | POA: Diagnosis not present

## 2024-04-16 DIAGNOSIS — M47812 Spondylosis without myelopathy or radiculopathy, cervical region: Secondary | ICD-10-CM | POA: Diagnosis not present

## 2024-04-23 DIAGNOSIS — H524 Presbyopia: Secondary | ICD-10-CM | POA: Diagnosis not present

## 2024-04-23 DIAGNOSIS — H5203 Hypermetropia, bilateral: Secondary | ICD-10-CM | POA: Diagnosis not present

## 2024-04-23 DIAGNOSIS — E7801 Familial hypercholesterolemia: Secondary | ICD-10-CM | POA: Diagnosis not present

## 2024-05-03 DIAGNOSIS — G894 Chronic pain syndrome: Secondary | ICD-10-CM | POA: Diagnosis not present

## 2024-05-03 DIAGNOSIS — M47817 Spondylosis without myelopathy or radiculopathy, lumbosacral region: Secondary | ICD-10-CM | POA: Diagnosis not present

## 2024-05-03 DIAGNOSIS — M47812 Spondylosis without myelopathy or radiculopathy, cervical region: Secondary | ICD-10-CM | POA: Diagnosis not present

## 2024-05-03 DIAGNOSIS — M62830 Muscle spasm of back: Secondary | ICD-10-CM | POA: Diagnosis not present

## 2024-05-16 DIAGNOSIS — M47812 Spondylosis without myelopathy or radiculopathy, cervical region: Secondary | ICD-10-CM | POA: Diagnosis not present

## 2024-05-16 DIAGNOSIS — M47817 Spondylosis without myelopathy or radiculopathy, lumbosacral region: Secondary | ICD-10-CM | POA: Diagnosis not present

## 2024-06-07 DIAGNOSIS — M47812 Spondylosis without myelopathy or radiculopathy, cervical region: Secondary | ICD-10-CM | POA: Diagnosis not present

## 2024-06-07 DIAGNOSIS — M47817 Spondylosis without myelopathy or radiculopathy, lumbosacral region: Secondary | ICD-10-CM | POA: Diagnosis not present

## 2024-06-07 DIAGNOSIS — G894 Chronic pain syndrome: Secondary | ICD-10-CM | POA: Diagnosis not present

## 2024-06-07 DIAGNOSIS — M62830 Muscle spasm of back: Secondary | ICD-10-CM | POA: Diagnosis not present

## 2024-07-16 DIAGNOSIS — M47812 Spondylosis without myelopathy or radiculopathy, cervical region: Secondary | ICD-10-CM | POA: Diagnosis not present

## 2024-07-16 DIAGNOSIS — M47817 Spondylosis without myelopathy or radiculopathy, lumbosacral region: Secondary | ICD-10-CM | POA: Diagnosis not present

## 2024-08-18 ENCOUNTER — Other Ambulatory Visit: Payer: Self-pay | Admitting: Physical Medicine and Rehabilitation

## 2024-08-18 ENCOUNTER — Ambulatory Visit
Admission: RE | Admit: 2024-08-18 | Discharge: 2024-08-18 | Disposition: A | Discharge status: Home / Self Care | Payer: Medicare (Managed Care) | Source: Ambulatory Visit | Attending: Physical Medicine and Rehabilitation | Admitting: Physical Medicine and Rehabilitation

## 2024-08-18 DIAGNOSIS — M25559 Pain in unspecified hip: Secondary | ICD-10-CM

## 2024-08-31 ENCOUNTER — Other Ambulatory Visit: Payer: Self-pay | Admitting: Physical Medicine and Rehabilitation

## 2024-08-31 ENCOUNTER — Ambulatory Visit
Admission: RE | Admit: 2024-08-31 | Discharge: 2024-08-31 | Disposition: A | Payer: Medicare (Managed Care) | Source: Ambulatory Visit | Attending: Physical Medicine and Rehabilitation | Admitting: Physical Medicine and Rehabilitation

## 2024-08-31 DIAGNOSIS — M25569 Pain in unspecified knee: Secondary | ICD-10-CM
